# Patient Record
Sex: Male | Born: 1955
Health system: Southern US, Community
[De-identification: ages and names within clinical notes are randomized; demographics above are authoritative.]

## PROBLEM LIST (undated history)

## (undated) DIAGNOSIS — E119 Type 2 diabetes mellitus without complications: Secondary | ICD-10-CM

## (undated) DIAGNOSIS — R12 Heartburn: Secondary | ICD-10-CM

## (undated) DIAGNOSIS — C801 Malignant (primary) neoplasm, unspecified: Secondary | ICD-10-CM

## (undated) DIAGNOSIS — I10 Essential (primary) hypertension: Secondary | ICD-10-CM

## (undated) DIAGNOSIS — T7840XA Allergy, unspecified, initial encounter: Secondary | ICD-10-CM

## (undated) DIAGNOSIS — E785 Hyperlipidemia, unspecified: Secondary | ICD-10-CM

## (undated) HISTORY — PX: POLYPECTOMY: SHX149

## (undated) HISTORY — DX: Type 2 diabetes mellitus without complications: E11.9

## (undated) HISTORY — DX: Malignant (primary) neoplasm, unspecified: C80.1

## (undated) HISTORY — PX: COLONOSCOPY: SHX174

## (undated) HISTORY — DX: Hyperlipidemia, unspecified: E78.5

## (undated) HISTORY — DX: Heartburn: R12

## (undated) HISTORY — DX: Allergy, unspecified, initial encounter: T78.40XA

## (undated) HISTORY — PX: UPPER GASTROINTESTINAL ENDOSCOPY: SHX188

---

## 2002-05-18 ENCOUNTER — Ambulatory Visit (HOSPITAL_COMMUNITY): Admission: RE | Admit: 2002-05-18 | Discharge: 2002-05-18 | Payer: Self-pay | Admitting: General Surgery

## 2004-01-13 ENCOUNTER — Ambulatory Visit (HOSPITAL_COMMUNITY): Admission: RE | Admit: 2004-01-13 | Discharge: 2004-01-13 | Payer: Self-pay | Admitting: Family Medicine

## 2006-10-22 HISTORY — PX: CARPAL TUNNEL RELEASE: SHX101

## 2007-10-18 ENCOUNTER — Emergency Department (HOSPITAL_COMMUNITY): Admission: EM | Admit: 2007-10-18 | Discharge: 2007-10-18 | Payer: Self-pay | Admitting: Emergency Medicine

## 2007-10-21 ENCOUNTER — Ambulatory Visit (HOSPITAL_COMMUNITY): Admission: RE | Admit: 2007-10-21 | Discharge: 2007-10-21 | Payer: Self-pay | Admitting: Family Medicine

## 2007-11-18 ENCOUNTER — Ambulatory Visit (HOSPITAL_COMMUNITY): Admission: RE | Admit: 2007-11-18 | Discharge: 2007-11-18 | Payer: Self-pay | Admitting: General Surgery

## 2009-11-26 DIAGNOSIS — E785 Hyperlipidemia, unspecified: Secondary | ICD-10-CM

## 2009-11-26 HISTORY — DX: Hyperlipidemia, unspecified: E78.5

## 2010-07-02 DIAGNOSIS — C801 Malignant (primary) neoplasm, unspecified: Secondary | ICD-10-CM

## 2010-07-02 HISTORY — DX: Malignant (primary) neoplasm, unspecified: C80.1

## 2010-08-22 ENCOUNTER — Ambulatory Visit
Admission: RE | Admit: 2010-08-22 | Discharge: 2010-10-18 | Payer: Self-pay | Source: Home / Self Care | Attending: Radiation Oncology | Admitting: Radiation Oncology

## 2010-08-31 ENCOUNTER — Ambulatory Visit (HOSPITAL_COMMUNITY): Admission: RE | Admit: 2010-08-31 | Discharge: 2010-08-31 | Payer: Self-pay | Admitting: Radiation Oncology

## 2010-09-05 ENCOUNTER — Ambulatory Visit (HOSPITAL_COMMUNITY): Admission: RE | Admit: 2010-09-05 | Discharge: 2010-09-05 | Payer: Self-pay | Admitting: Internal Medicine

## 2010-09-21 ENCOUNTER — Ambulatory Visit
Admission: RE | Admit: 2010-09-21 | Discharge: 2010-09-21 | Payer: Self-pay | Source: Home / Self Care | Admitting: Urology

## 2010-09-21 HISTORY — PX: PROSTATE SURGERY: SHX751

## 2010-11-06 ENCOUNTER — Ambulatory Visit
Admission: RE | Admit: 2010-11-06 | Discharge: 2010-11-10 | Payer: Self-pay | Source: Home / Self Care | Attending: Radiation Oncology | Admitting: Radiation Oncology

## 2010-11-22 ENCOUNTER — Ambulatory Visit: Admission: RE | Admit: 2010-11-22 | Payer: Self-pay | Source: Ambulatory Visit | Admitting: Radiation Oncology

## 2010-11-26 DIAGNOSIS — I10 Essential (primary) hypertension: Secondary | ICD-10-CM | POA: Insufficient documentation

## 2010-11-26 HISTORY — DX: Essential (primary) hypertension: I10

## 2011-01-02 LAB — COMPREHENSIVE METABOLIC PANEL
ALT: 20 U/L (ref 0–53)
AST: 24 U/L (ref 0–37)
Albumin: 4.2 g/dL (ref 3.5–5.2)
Alkaline Phosphatase: 57 U/L (ref 39–117)
BUN: 9 mg/dL (ref 6–23)
CO2: 28 mEq/L (ref 19–32)
Calcium: 9.8 mg/dL (ref 8.4–10.5)
Chloride: 104 mEq/L (ref 96–112)
Creatinine, Ser: 1.24 mg/dL (ref 0.4–1.5)
GFR calc Af Amer: >60 mL/min (ref >60)
GFR calc non Af Amer: >60 mL/min (ref >60)
Glucose, Bld: 111 mg/dL — ABNORMAL HIGH (ref 70–99)
Potassium: 4.3 mEq/L (ref 3.5–5.1)
Sodium: 142 mEq/L (ref 135–145)
Total Bilirubin: 0.9 mg/dL (ref 0.3–1.2)
Total Protein: 7.1 g/dL (ref 6.0–8.3)

## 2011-01-02 LAB — CBC
HCT: 46.8 % (ref 39.0–52.0)
Hemoglobin: 15.9 g/dL (ref 13.0–17.0)
MCH: 28.6 pg (ref 26.0–34.0)
MCHC: 33.9 g/dL (ref 30.0–36.0)
MCV: 84.4 fL (ref 78.0–100.0)
Platelets: 261 10*3/uL (ref 150–400)
RBC: 5.55 MIL/uL (ref 4.22–5.81)
RDW: 13.6 % (ref 11.5–15.5)
WBC: 5.6 10*3/uL (ref 4.0–10.5)

## 2011-01-02 LAB — APTT: aPTT: 34 seconds (ref 24–37)

## 2011-01-02 LAB — PROTIME-INR
INR: 0.94 (ref 0.00–1.49)
Prothrombin Time: 12.8 seconds (ref 11.6–15.2)

## 2011-03-06 NOTE — H&P (Signed)
NAME:  Nathaniel Smith, Nathaniel Smith              ACCOUNT NO.:  [id number]   MEDICAL RECORD NO.:  [id number]          PATIENT TYPE:  AMB   LOCATION:  DAY                           FACILITY:  [facility]   PHYSICIAN:  Dalia Heading, M.D.  DATE OF BIRTH:  [birth year]   DATE OF ADMISSION:  DATE OF DISCHARGE:  LH                              HISTORY & PHYSICAL   CHIEF COMPLAINT:  Family history of colon carcinoma.   HISTORY OF PRESENT ILLNESS:  Patient is a 55 year old black male who is  referred for endoscopic evaluation.  He needs a colonoscopy due to a  family history of colon carcinoma.  No abdominal pain, weight loss,  nausea, vomiting, diarrhea, constipation, melena, or hematochezia have  been noted.  He last had a colonoscopy in 2003 which was negative.   PAST MEDICAL HISTORY:  Unremarkable.   PAST SURGICAL HISTORY:  Unremarkable.   CURRENT MEDICATIONS:  None.   ALLERGIES:  No known drug allergies.   REVIEW OF SYSTEMS:  Noncontributory.   PHYSICAL EXAMINATION:  Patient is a well-developed and well-nourished  black male in no acute distress.  LUNGS:  Clear to auscultation with equal breath sounds bilaterally.  HEART:  Regular rate and rhythm without S3, S4, or murmurs.  ABDOMEN:  Soft, nontender, nondistended.  No hepatosplenomegaly or  masses are noted.  RECTAL:  Deferred to the procedure.   IMPRESSION:  Family history of colon carcinoma.   PLAN:  Patient is scheduled for a colonoscopy on November 18, 2007.  The  risks and benefits of the procedure, including bleeding and perforation,  were fully explained to the patient, who gave informed consent.      Dalia Heading, M.D.  Electronically Signed     MAJ/MEDQ  D:  11/04/2007  T:  11/04/2007  Job:  [id number]   cc:   [facility]  Fax: [phone]   Kirk Ruths, M.D.  Fax: [phone]

## 2011-11-21 ENCOUNTER — Encounter: Payer: Self-pay | Admitting: *Deleted

## 2011-11-21 NOTE — Progress Notes (Signed)
I-PPS 10/13/10=[id number] score=13

## 2013-11-09 ENCOUNTER — Emergency Department (HOSPITAL_COMMUNITY)
Admission: EM | Admit: 2013-11-09 | Discharge: 2013-11-09 | Disposition: A | Discharge status: Home / Self Care | Payer: No Typology Code available for payment source | Source: Home / Self Care | Attending: Family Medicine | Admitting: Family Medicine

## 2013-11-09 ENCOUNTER — Encounter (HOSPITAL_COMMUNITY): Payer: Self-pay | Admitting: Emergency Medicine

## 2013-11-09 DIAGNOSIS — J069 Acute upper respiratory infection, unspecified: Secondary | ICD-10-CM

## 2013-11-09 HISTORY — DX: Essential (primary) hypertension: I10

## 2013-11-09 NOTE — Discharge Instructions (Signed)
Continue using the allergy medicine is helping to control your symptoms.  You might also try using saline nasal spray several times a day to help relieve the congestion and cough.    Upper Respiratory Infection, Adult An upper respiratory infection (URI) is also sometimes known as the common cold. The upper respiratory tract includes the nose, sinuses, throat, trachea, and bronchi. Bronchi are the airways leading to the lungs. Most people improve within 1 week, but symptoms can last up to 2 weeks. A residual cough may last even longer.  CAUSES Many different viruses can infect the tissues lining the upper respiratory tract. The tissues become irritated and inflamed and often become very moist. Mucus production is also common. A cold is contagious. You can easily spread the virus to others by oral contact. This includes kissing, sharing a glass, coughing, or sneezing. Touching your mouth or nose and then touching a surface, which is then touched by another person, can also spread the virus. SYMPTOMS  Symptoms typically develop 1 to 3 days after you come in contact with a cold virus. Symptoms vary from person to person. They may include:  Runny nose.  Sneezing.  Nasal congestion.  Sinus irritation.  Sore throat.  Loss of voice (laryngitis).  Cough.  Fatigue.  Muscle aches.  Loss of appetite.  Headache.  Low-grade fever. DIAGNOSIS  You might diagnose your own cold based on familiar symptoms, since most people get a cold 2 to 3 times a year. Your caregiver can confirm this based on your exam. Most importantly, your caregiver can check that your symptoms are not due to another disease such as strep throat, sinusitis, pneumonia, asthma, or epiglottitis. Blood tests, throat tests, and X-rays are not necessary to diagnose a common cold, but they may sometimes be helpful in excluding other more serious diseases. Your caregiver will decide if any further tests are required. RISKS AND  COMPLICATIONS  You may be at risk for a more severe case of the common cold if you smoke cigarettes, have chronic heart disease (such as heart failure) or lung disease (such as asthma), or if you have a weakened immune system. The very young and very old are also at risk for more serious infections. Bacterial sinusitis, middle ear infections, and bacterial pneumonia can complicate the common cold. The common cold can worsen asthma and chronic obstructive pulmonary disease (COPD). Sometimes, these complications can require emergency medical care and may be life-threatening. PREVENTION  The best way to protect against getting a cold is to practice good hygiene. Avoid oral or hand contact with people with cold symptoms. Wash your hands often if contact occurs. There is no clear evidence that vitamin C, vitamin E, echinacea, or exercise reduces the chance of developing a cold. However, it is always recommended to get plenty of rest and practice good nutrition. TREATMENT  Treatment is directed at relieving symptoms. There is no cure. Antibiotics are not effective, because the infection is caused by a virus, not by bacteria. Treatment may include:  Increased fluid intake. Sports drinks offer valuable electrolytes, sugars, and fluids.  Breathing heated mist or steam (vaporizer or shower).  Eating chicken soup or other clear broths, and maintaining good nutrition.  Getting plenty of rest.  Using gargles or lozenges for comfort.  Controlling fevers with ibuprofen or acetaminophen as directed by your caregiver.  Increasing usage of your inhaler if you have asthma. Zinc gel and zinc lozenges, taken in the first 24 hours of the common cold, can shorten  the duration and lessen the severity of symptoms. Pain medicines may help with fever, muscle aches, and throat pain. A variety of non-prescription medicines are available to treat congestion and runny nose. Your caregiver can make recommendations and may  suggest nasal or lung inhalers for other symptoms.  HOME CARE INSTRUCTIONS   Only take over-the-counter or prescription medicines for pain, discomfort, or fever as directed by your caregiver.  Use a warm mist humidifier or inhale steam from a shower to increase air moisture. This may keep secretions moist and make it easier to breathe.  Drink enough water and fluids to keep your urine clear or pale yellow.  Rest as needed.  Return to work when your temperature has returned to normal or as your caregiver advises. You may need to stay home longer to avoid infecting others. You can also use a face mask and careful hand washing to prevent spread of the virus. SEEK MEDICAL CARE IF:   After the first few days, you feel you are getting worse rather than better.  You need your caregiver's advice about medicines to control symptoms.  You develop chills, worsening shortness of breath, or brown or red sputum. These may be signs of pneumonia.  You develop yellow or brown nasal discharge or pain in the face, especially when you bend forward. These may be signs of sinusitis.  You develop a fever, swollen neck glands, pain with swallowing, or white areas in the back of your throat. These may be signs of strep throat. SEEK IMMEDIATE MEDICAL CARE IF:   You have a fever.  You develop severe or persistent headache, ear pain, sinus pain, or chest pain.  You develop wheezing, a prolonged cough, cough up blood, or have a change in your usual mucus (if you have chronic lung disease).  You develop sore muscles or a stiff neck. Document Released: 04/03/2001 Document Revised: 12/31/2011 Document Reviewed: 02/09/2011 [facility]: [facility] W Patient Information 2014 [place], [place].

## 2013-11-09 NOTE — ED Notes (Signed)
C/o nonproductive cough that started yesterday and gradually getting worse.  Scratchy/sore throat.  Denies fever and any other symptoms.  No otc meds tried.

## 2013-11-09 NOTE — ED Provider Notes (Signed)
CSN: [id number]     Arrival date & time 11/09/13  1436 History   First MD Initiated Contact with Patient 11/09/13 1644     Chief Complaint  Patient presents with  . Cough   (Consider location/radiation/quality/duration/timing/severity/associated sxs/prior Treatment) Patient is a 58 y.o. male presenting with URI. The history is provided by the patient.  URI Presenting symptoms: congestion, cough, ear pain, rhinorrhea and sore throat   Presenting symptoms: no fever   Severity:  Mild Onset quality:  Gradual Duration:  2 days Timing:  Constant Progression:  Unchanged Chronicity:  New Relieved by: otc "allergy" medicine. Ineffective treatments:  OTC medications Associated symptoms: no wheezing     Past Medical History  Diagnosis Date  . Hypertension    History reviewed. No pertinent past surgical history. History reviewed. No pertinent family history. History  Substance Use Topics  . Smoking status: Never Smoker   . Smokeless tobacco: Not on file  . Alcohol Use: No    Review of Systems  Constitutional: Negative for fever and chills.  HENT: Positive for congestion, ear pain, postnasal drip, rhinorrhea and sore throat.   Respiratory: Positive for cough. Negative for shortness of breath and wheezing.     Allergies  Review of patient's allergies indicates no known allergies.  Home Medications   Current Outpatient Rx  Name  Route  Sig  Dispense  Refill  . amLODipine (NORVASC) 5 MG tablet   Oral   Take 5 mg by mouth daily.         [name] atorvastatin (LIPITOR) 20 MG tablet   Oral   Take 20 mg by mouth daily.          BP 149/98  Pulse 92  Temp(Src) 98.6 F (37 C) (Oral)  Resp 16  SpO2 99% Physical Exam  Constitutional: He appears well-developed and well-nourished. No distress.  Well appearing  HENT:  Right Ear: External ear and ear canal normal. A middle ear effusion is present.  Left Ear: External ear and ear canal normal. A middle ear effusion is present.   Nose: Mucosal edema and rhinorrhea present.  Mouth/Throat: Oropharynx is clear and moist and mucous membranes are normal.  Cardiovascular: Normal rate and regular rhythm.   Pulmonary/Chest: Effort normal and breath sounds normal.  Occasional dry cough  Lymphadenopathy:       Head (right side): No submental, no submandibular, no tonsillar, no preauricular and no posterior auricular adenopathy present.       Head (left side): No submental, no submandibular, no tonsillar, no preauricular and no posterior auricular adenopathy present.    He has no cervical adenopathy.    ED Course  Procedures (including critical care time) Labs Review Labs Reviewed - No data to display Imaging Review No results found.  EKG Interpretation    Date/Time:    Ventricular Rate:    PR Interval:    QRS Duration:   QT Interval:    QTC Calculation:   R Axis:     Text Interpretation:              MDM   1. URI (upper respiratory infection)   Recommended pt continue using "allergy" medicine since it has been helping control symptoms and recommended pt try saline nasal spray.       Carvel Getting, NP 11/09/13 [phone]

## 2013-11-10 NOTE — ED Provider Notes (Signed)
Medical screening examination/treatment/procedure(s) were performed by a resident physician or non-physician practitioner and as the supervising physician I was immediately available for consultation/collaboration.  Lynne Leader, MD    Gregor Hams, MD 11/10/13 [phone]

## 2013-12-01 ENCOUNTER — Encounter: Payer: Self-pay | Admitting: Family Medicine

## 2013-12-15 ENCOUNTER — Other Ambulatory Visit: Payer: No Typology Code available for payment source

## 2013-12-15 LAB — PSA: PSA: 1.2 ng/mL (ref <=4.00)

## 2013-12-15 LAB — LIPID PANEL
CHOLESTEROL: 130 mg/dL (ref 0–200)
HDL: 32 mg/dL — ABNORMAL LOW (ref >39)
LDL Cholesterol: 71 mg/dL (ref 0–99)
Total CHOL/HDL Ratio: 4.1 Ratio
Triglycerides: 135 mg/dL (ref <150)
VLDL: 27 mg/dL (ref 0–40)

## 2013-12-15 LAB — CBC WITH DIFFERENTIAL/PLATELET
Basophils Absolute: 0.1 10*3/uL (ref 0.0–0.1)
Basophils Relative: 1 % (ref 0–1)
EOS PCT: 2 % (ref 0–5)
Eosinophils Absolute: 0.1 10*3/uL (ref 0.0–0.7)
HCT: 44.6 % (ref 39.0–52.0)
Hemoglobin: 15.5 g/dL (ref 13.0–17.0)
LYMPHS ABS: 2.3 10*3/uL (ref 0.7–4.0)
LYMPHS PCT: 43 % (ref 12–46)
MCH: 27.7 pg (ref 26.0–34.0)
MCHC: 34.8 g/dL (ref 30.0–36.0)
MCV: 79.8 fL (ref 78.0–100.0)
Monocytes Absolute: 0.4 10*3/uL (ref 0.1–1.0)
Monocytes Relative: 7 % (ref 3–12)
NEUTROS ABS: 2.5 10*3/uL (ref 1.7–7.7)
NEUTROS PCT: 47 % (ref 43–77)
PLATELETS: 238 10*3/uL (ref 150–400)
RBC: 5.59 MIL/uL (ref 4.22–5.81)
RDW: 14.5 % (ref 11.5–15.5)
WBC: 5.4 10*3/uL (ref 4.0–10.5)

## 2013-12-15 LAB — COMPREHENSIVE METABOLIC PANEL
ALBUMIN: 4.1 g/dL (ref 3.5–5.2)
ALT: 22 U/L (ref 0–53)
AST: 19 U/L (ref 0–37)
Alkaline Phosphatase: 57 U/L (ref 39–117)
BUN: 7 mg/dL (ref 6–23)
CALCIUM: 9.3 mg/dL (ref 8.4–10.5)
CHLORIDE: 103 meq/L (ref 96–112)
CO2: 27 meq/L (ref 19–32)
Creat: 1.15 mg/dL (ref 0.50–1.35)
Glucose, Bld: 106 mg/dL — ABNORMAL HIGH (ref 70–99)
POTASSIUM: 4.6 meq/L (ref 3.5–5.3)
SODIUM: 140 meq/L (ref 135–145)
TOTAL PROTEIN: 6.6 g/dL (ref 6.0–8.3)
Total Bilirubin: 0.3 mg/dL (ref 0.2–1.2)

## 2013-12-16 ENCOUNTER — Encounter: Payer: Self-pay | Admitting: Physician Assistant

## 2013-12-16 ENCOUNTER — Ambulatory Visit (INDEPENDENT_AMBULATORY_CARE_PROVIDER_SITE_OTHER): Payer: No Typology Code available for payment source | Admitting: Physician Assistant

## 2013-12-16 VITALS — BP 162/94 | HR 72 | Temp 98.2°F | Resp 18 | Ht 70.0 in | Wt 202.0 lb

## 2013-12-16 DIAGNOSIS — I10 Essential (primary) hypertension: Secondary | ICD-10-CM

## 2013-12-16 DIAGNOSIS — Z8249 Family history of ischemic heart disease and other diseases of the circulatory system: Secondary | ICD-10-CM

## 2013-12-16 DIAGNOSIS — C801 Malignant (primary) neoplasm, unspecified: Secondary | ICD-10-CM

## 2013-12-16 DIAGNOSIS — Z0000 Encounter for general adult medical examination without abnormal findings: Secondary | ICD-10-CM

## 2013-12-16 DIAGNOSIS — E785 Hyperlipidemia, unspecified: Secondary | ICD-10-CM

## 2013-12-16 MED ORDER — ATORVASTATIN CALCIUM 10 MG PO TABS: 10.0000 mg | ORAL_TABLET | Freq: Every day | ORAL | Status: DC

## 2013-12-17 ENCOUNTER — Encounter: Payer: Self-pay | Admitting: Physician Assistant

## 2013-12-17 NOTE — Progress Notes (Signed)
Patient ID: Nathaniel Smith MRN: [id number], DOB: [birth year] 58 y.o. Date of Encounter: 12/17/2013, 7:29 AM    Chief Complaint: Physical (CPE)  HPI: 58 y.o. y/o male here for CPE. He is being seen as a NEW patient to our office today. He states that multiple of his family members see Dr. Buelah Manis and have recommended that he come to our office. Says he has been going to [facility] in [place] but the office has gotten very large and very busy.  He says he goes to [facility] every 6 months. History of prostate cancer and had seed implants.  Only other medical history is hyperlipidemia and hypertension. Takes Norvasc. He currently still takes Lipitor. Says he was on over-the-counter niacin but stopped it secondary to cost. Says he never was on prescription Niaspan. Says he was on Fish Oil prior to switching to the niacin.   Review of Systems: Consitutional: No fever, chills, fatigue, night sweats, lymphadenopathy, or weight changes. Eyes: No visual changes, eye redness, or discharge. ENT/Mouth: Ears: No otalgia, tinnitus, hearing loss, discharge. Nose: No congestion, rhinorrhea, sinus pain, or epistaxis. Throat: No sore throat, post nasal drip, or teeth pain. Cardiovascular: No CP, palpitations, diaphoresis, DOE, edema, orthopnea, PND. Respiratory: No cough, hemoptysis, SOB, or wheezing. Gastrointestinal: No anorexia, dysphagia, reflux, pain, nausea, vomiting, hematemesis, diarrhea, constipation, BRBPR, or melena. Genitourinary: No dysuria, frequency, urgency, hematuria, incontinence, nocturia, decreased urinary stream, discharge, impotence, or testicular pain/masses. Musculoskeletal: No decreased ROM, myalgias, stiffness, joint swelling, or weakness. Skin: No rash, erythema, lesion changes, pain, warmth, jaundice, or pruritis. Neurological: No headache, dizziness, syncope, seizures, tremors, memory loss, coordination problems, or paresthesias. Psychological: No anxiety,  depression, hallucinations, SI/HI. Endocrine: No fatigue, polydipsia, polyphagia, polyuria, or known diabetes. All other systems were reviewed and are otherwise negative.  Past Medical History  Diagnosis Date  . Hypertension 11/26/2010  . Hyperlipidemia 11/26/2009  . Cancer 07/02/10    prostate     Past Surgical History  Procedure Laterality Date  . Prostate surgery  09/2010    seeds implant  . Carpal tunnel release Right 10/22/2006    [facility]    Home Meds:  Current Outpatient Prescriptions on File Prior to Visit  Medication Sig Dispense Refill  . amLODipine (NORVASC) 5 MG tablet Take 5 mg by mouth at bedtime.       [name] aspirin 81 MG tablet Take 81 mg by mouth at bedtime.          lipitor--see attached section for dose-- No current facility-administered medications on file prior to visit.    Allergies: No Known Allergies  History   Social History  . Marital Status: Divorced    Spouse Name: N/A    Number of Children: N/A  . Years of Education: N/A   Occupational History  . Not on file.   Social History Main Topics  . Smoking status: Former Research scientist (life sciences)  . Smokeless tobacco: Never Used  . Alcohol Use: No  . Drug Use: No  . Sexual Activity: Yes    Birth Control/ Protection: Condom   Other Topics Concern  . Not on file   Social History Narrative  . No narrative on file    He works scrubbing dump trucks. Hauls dirt, rock, etc. Divorced. Has one son. One stepdaughter. [place]  Family History  Problem Relation Age of Onset  . Heart disease Brother 26    Stent at 54--CAD    Physical Exam: Blood  pressure 162/94, pulse 72, temperature 98.2 F (36.8 C), temperature source Oral, resp. rate 18, height 5\' 10"  (1.778 m), weight 202 lb (91.627 kg).  General: Well developed, well nourished, AAM. Appears in no acute distress. HEENT: Normocephalic, atraumatic. Conjunctiva pink, sclera non-icteric. Pupils 2 mm  constricting to 1 mm, round, regular, and equally reactive to light and accomodation. EOMI. Internal auditory canal clear. TMs with good cone of light and without pathology. Nasal mucosa pink. Nares are without discharge. No sinus tenderness. Oral mucosa pink. Dentition nml.. Pharynx without exudate.   Neck: Supple. Trachea midline. No thyromegaly. Full ROM. No lymphadenopathy. Lungs: Clear to auscultation bilaterally without wheezes, rales, or rhonchi. Breathing is of normal effort and unlabored. Cardiovascular: RRR with S1 S2. No murmurs, rubs, or gallops. Distal pulses 2+ symmetrically. No carotid or abdominal bruits. Abdomen: Soft, non-tender, non-distended with normoactive bowel sounds. No hepatosplenomegaly or masses. No rebound/guarding. No CVA tenderness. No hernias. Rectal: Deferred today. He sees urology and has this with them every 6 months. Musculoskeletal: Full range of motion and 5/5 strength throughout. Without swelling, atrophy, tenderness, crepitus, or warmth. Extremities without clubbing, cyanosis, or edema. Calves supple. Skin: Warm and moist without erythema, ecchymosis, wounds, or rash. Neuro: A+Ox3. CN II-XII grossly intact. Moves all extremities spontaneously. Full sensation throughout. Normal gait. DTR 2+ throughout upper and lower extremities. Finger to nose intact. Psych:  Responds to questions appropriately with a normal affect.   Assessment/Plan:  58 y.o. y/o  male here for CPE -1. Visit for preventive health examination  A. Screening Labs: He came and had fasting labs done 12/15/13. Had CBC, CMET, PSA, FLP. All labs were normal.  B. Screening For Prostate Cancer: See history of present illness. He has a history of prostate cancer and sees [facility] every 6 months.  C. Screening For Colorectal Cancer:  He reports that he has had 2 colonoscopies. They were performed in [place] but does not remember that doctor's name. Says he thinks that he was told that the  first one showed polyps. Does not recall whether the second one showed any polyps or if it was normal. He is uncertain the date that he is due for followup.  D. Immunizations: Flu: He reports he has received a flu vaccine this year. Tetanus: HE IS NOT CERTAIN ABOUT THIS--WILL CHECK RECORDS AT [facility]. IF NONE IN PAST 10 YEARS, WILL UPDATE Pneumonia Vaccines: Will start at age 63. No indication to receive one sooner. Zostavax: Will Discuss at age 24.    2. Family history of premature coronary artery disease Brother had a coronary stent at age 77. However neither of his parents have had CAD. He says his father has actually had some symptoms of CHF. Says that he has had a cardiac cath that showed no significant CAD. Says that he was a long-time heavy smoker.  3. Hyperlipidemia Lipid panel performed yesterday shows LDL down to 71. Discussed patient's risk factor profile with him. Only positive cardiac risk factors are hypertension and his brother's history of stent. However there is no other family history of CAD. Discussed whether to keep him on Lipitor 20 versus decreasing the dose. We will go ahead and decrease the dose down to 10 mg and monitor. - atorvastatin (LIPITOR) 10 MG tablet; Take 1 tablet (10 mg total) by mouth daily.  Dispense: 90 tablet; Refill: 3  4. Hypertension Initial blood pressure reading today was elevated. However I repeated and got a low reading. he states his blood pressure has been good.  Continue current medication now and monitor prior to adjusting medicine.  5.H/O Prostate Cancer--managed at [facility]  Followup regular office visit 6 months or sooner if needed.  Signed:   [address] [place], [place]  12/17/2013 7:29 AM

## 2014-06-17 ENCOUNTER — Ambulatory Visit: Payer: No Typology Code available for payment source | Admitting: Physician Assistant

## 2014-11-25 ENCOUNTER — Other Ambulatory Visit: Payer: No Typology Code available for payment source

## 2014-11-25 ENCOUNTER — Other Ambulatory Visit: Payer: Self-pay | Admitting: Family Medicine

## 2014-11-25 ENCOUNTER — Telehealth: Payer: Self-pay | Admitting: *Deleted

## 2014-11-25 DIAGNOSIS — Z0000 Encounter for general adult medical examination without abnormal findings: Secondary | ICD-10-CM

## 2014-11-25 DIAGNOSIS — Z79899 Other long term (current) drug therapy: Secondary | ICD-10-CM

## 2014-11-25 DIAGNOSIS — I10 Essential (primary) hypertension: Secondary | ICD-10-CM

## 2014-11-25 DIAGNOSIS — E785 Hyperlipidemia, unspecified: Secondary | ICD-10-CM

## 2014-11-25 LAB — COMPLETE METABOLIC PANEL WITH GFR
ALK PHOS: 46 U/L (ref 39–117)
ALT: 22 U/L (ref 0–53)
AST: 23 U/L (ref 0–37)
Albumin: 4.2 g/dL (ref 3.5–5.2)
BUN: 9 mg/dL (ref 6–23)
CHLORIDE: 104 meq/L (ref 96–112)
CO2: 25 mEq/L (ref 19–32)
CREATININE: 1.22 mg/dL (ref 0.50–1.35)
Calcium: 9.6 mg/dL (ref 8.4–10.5)
GFR, EST AFRICAN AMERICAN: 75 mL/min
GFR, Est Non African American: 65 mL/min
GLUCOSE: 87 mg/dL (ref 70–99)
POTASSIUM: 4.4 meq/L (ref 3.5–5.3)
SODIUM: 139 meq/L (ref 135–145)
Total Bilirubin: 0.6 mg/dL (ref 0.2–1.2)
Total Protein: 6.8 g/dL (ref 6.0–8.3)

## 2014-11-25 LAB — CBC WITH DIFFERENTIAL/PLATELET
BASOS ABS: 0.1 10*3/uL (ref 0.0–0.1)
Basophils Relative: 1 % (ref 0–1)
EOS ABS: 0.1 10*3/uL (ref 0.0–0.7)
EOS PCT: 2 % (ref 0–5)
HEMATOCRIT: 45.3 % (ref 39.0–52.0)
HEMOGLOBIN: 15.4 g/dL (ref 13.0–17.0)
LYMPHS ABS: 2.2 10*3/uL (ref 0.7–4.0)
LYMPHS PCT: 41 % (ref 12–46)
MCH: 27.5 pg (ref 26.0–34.0)
MCHC: 34 g/dL (ref 30.0–36.0)
MCV: 80.9 fL (ref 78.0–100.0)
MPV: 9.5 fL (ref 8.6–12.4)
Monocytes Absolute: 0.4 10*3/uL (ref 0.1–1.0)
Monocytes Relative: 7 % (ref 3–12)
NEUTROS ABS: 2.6 10*3/uL (ref 1.7–7.7)
Neutrophils Relative %: 49 % (ref 43–77)
PLATELETS: 262 10*3/uL (ref 150–400)
RBC: 5.6 MIL/uL (ref 4.22–5.81)
RDW: 13.7 % (ref 11.5–15.5)
WBC: 5.4 10*3/uL (ref 4.0–10.5)

## 2014-11-25 LAB — LIPID PANEL
CHOL/HDL RATIO: 4 ratio
CHOLESTEROL: 159 mg/dL (ref 0–200)
HDL: 40 mg/dL (ref >39)
LDL CALC: 101 mg/dL — AB (ref 0–99)
Triglycerides: 92 mg/dL (ref <150)
VLDL: 18 mg/dL (ref 0–40)

## 2014-11-25 MED ORDER — AMLODIPINE BESYLATE 5 MG PO TABS: 5.0000 mg | ORAL_TABLET | Freq: Every day | ORAL | Status: DC

## 2014-11-25 NOTE — Telephone Encounter (Signed)
Received call from patient.  Requested refill on HTN medication for 30 days since he has CPE scheduled with MD in 2 weeks.   Prescription sent to pharmacy.

## 2014-11-30 ENCOUNTER — Encounter: Payer: Self-pay | Admitting: Family Medicine

## 2014-11-30 ENCOUNTER — Ambulatory Visit (INDEPENDENT_AMBULATORY_CARE_PROVIDER_SITE_OTHER): Payer: No Typology Code available for payment source | Admitting: Family Medicine

## 2014-11-30 ENCOUNTER — Other Ambulatory Visit: Payer: Self-pay | Admitting: Family Medicine

## 2014-11-30 VITALS — BP 132/98 | HR 86 | Temp 98.1°F | Resp 16 | Ht 70.0 in | Wt 201.0 lb

## 2014-11-30 DIAGNOSIS — I10 Essential (primary) hypertension: Secondary | ICD-10-CM

## 2014-11-30 MED ORDER — CLONIDINE HCL 0.1 MG PO TABS: 0.1000 mg | ORAL_TABLET | Freq: Two times a day (BID) | ORAL | Status: DC

## 2014-11-30 NOTE — Progress Notes (Signed)
Subjective:    Patient ID: Nathaniel Smith, male    DOB: 1956/02/27, 59 y.o.   MRN: [id number]  HPI Patient has been taking his amlodipine regularly for the last several weeks. His blood pressures typically 150/100 and home. Here today is 132/98. 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 He would like a medication that would work instantly to help lower his blood pressure to that he could take his DOT physical tomorrow. He denies any chest pain shortness of breath or dyspnea on exertion. Past Medical History  Diagnosis Date  . Hypertension 11/26/2010  . Hyperlipidemia 11/26/2009  . Cancer 07/02/10    prostate   Past Surgical History  Procedure Laterality Date  . Prostate surgery  09/2010    seeds implant  . Carpal tunnel release Right 10/22/2006    [facility]   Current Outpatient Prescriptions on File Prior to Visit  Medication Sig Dispense Refill  . amLODipine (NORVASC) 5 MG tablet TAKE 1 TABLET BY MOUTH EVERY NIGHT AT BEDTIME 90 tablet 1  . aspirin 81 MG tablet Take 81 mg by mouth at bedtime.    [name] atorvastatin (LIPITOR) 10 MG tablet Take 1 tablet (10 mg total) by mouth daily. 90 tablet 3  . Multiple Vitamin (MULTIVITAMIN) tablet Take 1 tablet by mouth daily.    . sildenafil (REVATIO) 20 MG tablet Take 20 mg by mouth as needed.     No current facility-administered medications on file prior to visit.   No Known Allergies History   Social History  . Marital Status: Divorced    Spouse Name: N/A    Number of Children: N/A  . Years of Education: N/A   Occupational History  . Not on file.   Social History Main Topics  . Smoking status: Former Research scientist (life sciences)  . Smokeless tobacco: Never Used  . Alcohol Use: No  . Drug Use: No  . Sexual Activity: Yes    Birth Control/ Protection: Condom   Other Topics Concern  . Not on file   Social History Narrative   He drives dump trucks. Hauls rock, dirt, etc.   Divorced. Has one son  and one stepdaughter.   :[place] every morning Monday through Friday.   He smoked 1/2 pack a day starting at age 61 and quit at age 26.--smoked for about 26 years.    Has Smoked none for 13 years.      Review of Systems  All other systems reviewed and are negative.      Objective:   Physical Exam  Cardiovascular: Normal rate, regular rhythm and normal heart sounds.   Pulmonary/Chest: Effort normal and breath sounds normal. No respiratory distress. He has no wheezes. He has no rales.  Abdominal: Soft. Bowel sounds are normal.  Musculoskeletal: He exhibits no edema.  Vitals reviewed.         Assessment & Plan:  Benign essential HTN - Plan: cloNIDine (CATAPRES) 0.1 MG tablet  Although not my first choice, I will start the patient on clonidine 0.1 mg by mouth twice a day to help lower his blood pressure aggressively so he can take his DOT physical as soon as possible. Once he passes his DOT physical, I would like to discontinue the clonidine and switch the patient to Cozaar 50 mg by mouth daily which I think is a better and safer long-term option. Patient is to take the clonidine tonight and  then first thing in the morning and come by here middle of the day. I would like to check his blood pressure here and write that down on a prescription pad so that he can take that with him to his DOT physical because I do believe there is an element of white coat syndrome artificially elevating his blood pressure when he has a DOT physical.

## 2014-12-01 ENCOUNTER — Ambulatory Visit: Payer: No Typology Code available for payment source | Admitting: *Deleted

## 2014-12-01 ENCOUNTER — Telehealth: Payer: Self-pay | Admitting: *Deleted

## 2014-12-01 VITALS — BP 130/86

## 2014-12-01 DIAGNOSIS — I10 Essential (primary) hypertension: Secondary | ICD-10-CM

## 2014-12-01 NOTE — Telephone Encounter (Signed)
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

## 2014-12-02 NOTE — Telephone Encounter (Signed)
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

## 2014-12-02 NOTE — Telephone Encounter (Signed)
lmtrc

## 2014-12-03 ENCOUNTER — Telehealth: Payer: Self-pay | Admitting: Physician Assistant

## 2014-12-03 ENCOUNTER — Other Ambulatory Visit: Payer: Self-pay | Admitting: Family Medicine

## 2014-12-03 MED ORDER — LOSARTAN POTASSIUM 100 MG PO TABS: 100.0000 mg | ORAL_TABLET | Freq: Every day | ORAL | Status: DC

## 2014-12-03 NOTE — Telephone Encounter (Signed)
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

## 2014-12-03 NOTE — Telephone Encounter (Signed)
Switch clonidine to losartan 100 mg poqday in addition to amlodipine.

## 2014-12-03 NOTE — Telephone Encounter (Signed)
Pt aware and med sent to pharm 

## 2014-12-03 NOTE — Telephone Encounter (Signed)
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

## 2014-12-07 ENCOUNTER — Encounter: Payer: Self-pay | Admitting: Family Medicine

## 2014-12-07 ENCOUNTER — Ambulatory Visit (INDEPENDENT_AMBULATORY_CARE_PROVIDER_SITE_OTHER): Payer: No Typology Code available for payment source | Admitting: Family Medicine

## 2014-12-07 VITALS — BP 118/90 | HR 74 | Temp 98.2°F | Resp 16 | Ht 70.0 in | Wt 203.0 lb

## 2014-12-07 DIAGNOSIS — Z0000 Encounter for general adult medical examination without abnormal findings: Secondary | ICD-10-CM

## 2014-12-07 DIAGNOSIS — I10 Essential (primary) hypertension: Secondary | ICD-10-CM

## 2014-12-07 DIAGNOSIS — E785 Hyperlipidemia, unspecified: Secondary | ICD-10-CM

## 2014-12-07 DIAGNOSIS — Z8249 Family history of ischemic heart disease and other diseases of the circulatory system: Secondary | ICD-10-CM

## 2014-12-07 NOTE — Progress Notes (Signed)
Subjective:    Patient ID: Nathaniel Smith, male    DOB: 02/15/1956, 59 y.o.   MRN: [id number]  HPI Patient is a very pleasant 59 year old African-American male who is here today for a complete physical exam. Medical history is significant for hypertension, hyperlipidemia, a family history of premature coronary artery disease,, and prostate cancer. His prostate is monitored by his urologist. His most recent PSA was 0.7. Patient's last colonoscopy was in 2009 and was normal. He is not due again until 2019. He is due today for a tetanus vaccine as well as a flu shot but he declines this. He is currently on losartan and amlodipine for hypertension. His blood pressure is much improved on these medications at 118/90. He denies any chest pain shortness of breath or dyspnea on exertion. His most recent lab work as listed below: Lab on 11/25/2014  Component Date Value Ref Range Status  . Sodium 11/25/2014 139  135 - 145 mEq/L Final  . Potassium 11/25/2014 4.4  3.5 - 5.3 mEq/L Final  . Chloride 11/25/2014 104  96 - 112 mEq/L Final  . CO2 11/25/2014 25  19 - 32 mEq/L Final  . Glucose, Bld 11/25/2014 87  70 - 99 mg/dL Final  . BUN 11/25/2014 9  6 - 23 mg/dL Final  . Creat 11/25/2014 1.22  0.50 - 1.35 mg/dL Final  . Total Bilirubin 11/25/2014 0.6  0.2 - 1.2 mg/dL Final  . Alkaline Phosphatase 11/25/2014 46  39 - 117 U/L Final  . AST 11/25/2014 23  0 - 37 U/L Final  . ALT 11/25/2014 22  0 - 53 U/L Final  . Total Protein 11/25/2014 6.8  6.0 - 8.3 g/dL Final  . Albumin 11/25/2014 4.2  3.5 - 5.2 g/dL Final  . Calcium 11/25/2014 9.6  8.4 - 10.5 mg/dL Final  . GFR, Est African American 11/25/2014 75   Final  . GFR, Est Non African American 11/25/2014 65   Final   Comment:   The estimated GFR is a calculation valid for adults (>=73 years old) that uses the CKD-EPI algorithm to adjust for age and sex. It is   not to be used for children, pregnant women, hospitalized patients,    patients on dialysis, or  with rapidly changing kidney function. According to the NKDEP, eGFR >89 is normal, 60-89 shows mild impairment, 30-59 shows moderate impairment, 15-29 shows severe impairment and <15 is ESRD.     [name] Cholesterol 11/25/2014 159  0 - 200 mg/dL Final   Comment: ATP III Classification:       < 200        mg/dL        Desirable      200 - 239     mg/dL        Borderline High      >= 240        mg/dL        High     . Triglycerides 11/25/2014 92  <150 mg/dL Final  . HDL 11/25/2014 40  >39 mg/dL Final  . Total CHOL/HDL Ratio 11/25/2014 4.0   Final  . VLDL 11/25/2014 18  0 - 40 mg/dL Final  . LDL Cholesterol 11/25/2014 101* 0 - 99 mg/dL Final   Comment:   Total Cholesterol/HDL Ratio:CHD Risk                        Coronary Heart Disease Risk Table  Men       Women          1/2 Average Risk              3.4        3.3              Average Risk              5.0        4.4           2X Average Risk              9.6        7.1           3X Average Risk             23.4       11.0 Use the calculated Patient Ratio above and the CHD Risk table  to determine the patient's CHD Risk. ATP III Classification (LDL):       < 100        mg/dL         Optimal      100 - 129     mg/dL         Near or Above Optimal      130 - 159     mg/dL         Borderline High      160 - 189     mg/dL         High       > 190        mg/dL         Very High     . WBC 11/25/2014 5.4  4.0 - 10.5 K/uL Final  . RBC 11/25/2014 5.60  4.22 - 5.81 MIL/uL Final  . Hemoglobin 11/25/2014 15.4  13.0 - 17.0 g/dL Final  . HCT 11/25/2014 45.3  39.0 - 52.0 % Final  . MCV 11/25/2014 80.9  78.0 - 100.0 fL Final  . MCH 11/25/2014 27.5  26.0 - 34.0 pg Final  . MCHC 11/25/2014 34.0  30.0 - 36.0 g/dL Final  . RDW 11/25/2014 13.7  11.5 - 15.5 % Final  . Platelets 11/25/2014 262  150 - 400 K/uL Final  . MPV 11/25/2014 9.5  8.6 - 12.4 fL Final  . Neutrophils Relative % 11/25/2014 49  43 - 77 % Final    . Neutro Abs 11/25/2014 2.6  1.7 - 7.7 K/uL Final  . Lymphocytes Relative 11/25/2014 41  12 - 46 % Final  . Lymphs Abs 11/25/2014 2.2  0.7 - 4.0 K/uL Final  . Monocytes Relative 11/25/2014 7  3 - 12 % Final  . Monocytes Absolute 11/25/2014 0.4  0.1 - 1.0 K/uL Final  . Eosinophils Relative 11/25/2014 2  0 - 5 % Final  . Eosinophils Absolute 11/25/2014 0.1  0.0 - 0.7 K/uL Final  . Basophils Relative 11/25/2014 1  0 - 1 % Final  . Basophils Absolute 11/25/2014 0.1  0.0 - 0.1 K/uL Final  . Smear Review 11/25/2014 Criteria for review not met   Final   Past Medical History  Diagnosis Date  . Hypertension 11/26/2010  . Hyperlipidemia 11/26/2009  . Cancer 07/02/10    prostate   Past Surgical History  Procedure Laterality Date  . Prostate surgery  09/2010    seeds implant  . Carpal tunnel release Right 10/22/2006    Antionette Char   Current Outpatient Prescriptions  on File Prior to Visit  Medication Sig Dispense Refill  . amLODipine (NORVASC) 5 MG tablet TAKE 1 TABLET BY MOUTH EVERY NIGHT AT BEDTIME 90 tablet 1  . aspirin 81 MG tablet Take 81 mg by mouth at bedtime.    [name] atorvastatin (LIPITOR) 10 MG tablet Take 1 tablet (10 mg total) by mouth daily. 90 tablet 3  . Multiple Vitamin (MULTIVITAMIN) tablet Take 1 tablet by mouth daily.    . sildenafil (REVATIO) 20 MG tablet Take 20 mg by mouth as needed.     No current facility-administered medications on file prior to visit.   No Known Allergies History   Social History  . Marital Status: Divorced    Spouse Name: N/A  . Number of Children: N/A  . Years of Education: N/A   Occupational History  . Not on file.   Social History Main Topics  . Smoking status: Former Research scientist (life sciences)  . Smokeless tobacco: Never Used  . Alcohol Use: No  . Drug Use: No  . Sexual Activity: Yes    Birth Control/ Protection: Condom   Other Topics Concern  . Not on file   Social History Narrative   He drives dump trucks. Hauls rock, dirt, etc.   Divorced.  Has one son and one stepdaughter.   :Walks on  [place] for 1 mile every morning Monday through Friday.   He smoked 1/2 pack a day starting at age 47 and quit at age 59.--smoked for about 26 years.    Has Smoked none for 13 years.   Family History  Problem Relation Age of Onset  . Heart disease Brother 51    Stent at 54--CAD      Review of Systems  All other systems reviewed and are negative.      Objective:   Physical Exam  Constitutional: He is oriented to person, place, and time. He appears well-developed and well-nourished. No distress.  HENT:  Head: Normocephalic and atraumatic.  Right Ear: External ear normal.  Left Ear: External ear normal.  Nose: Nose normal.  Mouth/Throat: Oropharynx is clear and moist. No oropharyngeal exudate.  Eyes: Conjunctivae and EOM are normal. Pupils are equal, round, and reactive to light. Right eye exhibits no discharge. Left eye exhibits no discharge. No scleral icterus.  Neck: Normal range of motion. Neck supple. No JVD present. No tracheal deviation present. No thyromegaly present.  Cardiovascular: Normal rate, regular rhythm, normal heart sounds and intact distal pulses.  Exam reveals no gallop and no friction rub.   No murmur heard. Pulmonary/Chest: Effort normal and breath sounds normal. No stridor. No respiratory distress. He has no wheezes. He has no rales. He exhibits no tenderness.  Abdominal: Soft. Bowel sounds are normal. He exhibits no distension and no mass. There is no tenderness. There is no rebound and no guarding.  Musculoskeletal: Normal range of motion. He exhibits no edema or tenderness.  Lymphadenopathy:    He has no cervical adenopathy.  Neurological: He is alert and oriented to person, place, and time. He has normal reflexes. He displays normal reflexes. No cranial nerve deficit. He exhibits normal muscle tone. Coordination normal.  Skin: Skin is warm. No rash noted. He is not diaphoretic. No erythema. No pallor.    Psychiatric: He has a normal mood and affect. His behavior is normal. Judgment and thought content normal.  Vitals reviewed.         Assessment & Plan:  Routine general medical examination at a health care facility  Benign essential HTN - Plan: EKG 12-Lead  HLD (hyperlipidemia) - Plan: EKG 12-Lead  Family history of early CAD - Plan: EKG 12-Lead  Physical exam today is completely normal. His blood pressure is much improved. His lab work is excellent. Colonoscopy and prostate exam are up-to-date. I recommended a flu shot and tetanus shot but the patient declined. I will obtain a screening EKG to have as a baseline for the patient's chart given his multiple risk factors and his family history of coronary artery disease.  EKG shows normal sinus rhythm with a right bundle branch block, possible left atrial enlargement, and nonspecific ST changes. Recheck in 6 months or as needed.

## 2014-12-16 ENCOUNTER — Other Ambulatory Visit: Payer: No Typology Code available for payment source

## 2014-12-20 ENCOUNTER — Encounter: Payer: No Typology Code available for payment source | Admitting: Family Medicine

## 2015-05-05 ENCOUNTER — Encounter (HOSPITAL_COMMUNITY): Payer: Self-pay

## 2015-05-05 ENCOUNTER — Emergency Department (HOSPITAL_COMMUNITY)
Admission: EM | Admit: 2015-05-05 | Discharge: 2015-05-05 | Disposition: A | Discharge status: Home / Self Care | Payer: Self-pay | Attending: Emergency Medicine | Admitting: Emergency Medicine

## 2015-05-05 ENCOUNTER — Emergency Department (HOSPITAL_COMMUNITY): Payer: Self-pay

## 2015-05-05 DIAGNOSIS — S8012XA Contusion of left lower leg, initial encounter: Secondary | ICD-10-CM | POA: Insufficient documentation

## 2015-05-05 DIAGNOSIS — I10 Essential (primary) hypertension: Secondary | ICD-10-CM | POA: Insufficient documentation

## 2015-05-05 DIAGNOSIS — Z7982 Long term (current) use of aspirin: Secondary | ICD-10-CM | POA: Insufficient documentation

## 2015-05-05 DIAGNOSIS — Y9389 Activity, other specified: Secondary | ICD-10-CM | POA: Insufficient documentation

## 2015-05-05 DIAGNOSIS — Y9289 Other specified places as the place of occurrence of the external cause: Secondary | ICD-10-CM | POA: Insufficient documentation

## 2015-05-05 DIAGNOSIS — Z87891 Personal history of nicotine dependence: Secondary | ICD-10-CM | POA: Insufficient documentation

## 2015-05-05 DIAGNOSIS — Y990 Civilian activity done for income or pay: Secondary | ICD-10-CM | POA: Insufficient documentation

## 2015-05-05 DIAGNOSIS — Z8546 Personal history of malignant neoplasm of prostate: Secondary | ICD-10-CM | POA: Insufficient documentation

## 2015-05-05 DIAGNOSIS — W228XXA Striking against or struck by other objects, initial encounter: Secondary | ICD-10-CM | POA: Insufficient documentation

## 2015-05-05 DIAGNOSIS — Z79899 Other long term (current) drug therapy: Secondary | ICD-10-CM | POA: Insufficient documentation

## 2015-05-05 DIAGNOSIS — E785 Hyperlipidemia, unspecified: Secondary | ICD-10-CM | POA: Insufficient documentation

## 2015-05-05 NOTE — ED Notes (Signed)
Pt states he accidentally hit his left leg with a board at work last Tuesday, states since then it has gotten more painful and more swollen.  Pt ambulatory with minimal diff.

## 2015-05-05 NOTE — ED Provider Notes (Signed)
TIME SEEN: 3:45 AM  CHIEF COMPLAINT: Left leg pain and swelling  HPI: Pt is a 59 y.o. with history of hypertension, hyperlipidemia, previous history of prostate cancer who presents to the emergency department with left leg pain and swelling. He reports that one week ago he was working at a job site and a large piece of plywood hit him in the left shin and then slid down his leg. He noted that he had a knot over the medial aspect of the anterior proximal lower extremity. Noticed some pain in this area but was able to ambulate and work for several days. States that over the weekend he went to visit friends in [place]. On the flight home he noticed that his leg was more swollen and he had ecchymosis around his ankle. No prior history of PE or DVT. He denies chest pain or shortness of breath. He is on aspirin but no other antiplatelets or anticoagulation.  ROS: See HPI Constitutional: no fever  Eyes: no drainage  ENT: no runny nose   Cardiovascular:  no chest pain  Resp: no SOB  GI: no vomiting GU: no dysuria Integumentary: no rash  Allergy: no hives  Musculoskeletal: Left leg swelling  Neurological: no slurred speech ROS otherwise negative  PAST MEDICAL HISTORY/PAST SURGICAL HISTORY:  Past Medical History  Diagnosis Date  . Hypertension 11/26/2010  . Hyperlipidemia 11/26/2009  . Cancer 07/02/10    prostate    MEDICATIONS:  Prior to Admission medications   Medication Sig Start Date End Date Taking? Authorizing Provider  amLODipine (NORVASC) 5 MG tablet TAKE 1 TABLET BY MOUTH EVERY NIGHT AT BEDTIME 11/26/14  Yes Susy Frizzle, MD  aspirin 81 MG tablet Take 81 mg by mouth at bedtime.   Yes Historical Provider, MD  atorvastatin (LIPITOR) 10 MG tablet Take 1 tablet (10 mg total) by mouth daily. 12/16/13  Yes Mary B Dixon, PA-C  ibuprofen (ADVIL,MOTRIN) 400 MG tablet Take 400 mg by mouth every 6 (six) hours as needed.   Yes Historical Provider, MD  losartan (COZAAR) 100 MG tablet Take 100 mg  by mouth daily.   Yes Historical Provider, MD  Multiple Vitamin (MULTIVITAMIN) tablet Take 1 tablet by mouth daily.   Yes Historical Provider, MD  sildenafil (REVATIO) 20 MG tablet Take 20 mg by mouth as needed.   Yes Historical Provider, MD    ALLERGIES:  No Known Allergies  SOCIAL HISTORY:  History  Substance Use Topics  . Smoking status: Former Research scientist (life sciences)  . Smokeless tobacco: Never Used  . Alcohol Use: No    FAMILY HISTORY: Family History  Problem Relation Age of Onset  . Heart disease Brother 54    Stent at 54--CAD    EXAM: BP 139/89 mmHg  Pulse 84  Temp(Src) 98 F (36.7 C) (Oral)  Resp 20  Ht 5\' 10"  (1.778 m)  Wt 190 lb (86.183 kg)  BMI 27.26 kg/m2  SpO2 99% CONSTITUTIONAL: Alert and oriented and responds appropriately to questions. Well-appearing; well-nourished HEAD: Normocephalic EYES: Conjunctivae clear, PERRL ENT: normal nose; no rhinorrhea; moist mucous membranes; pharynx without lesions noted NECK: Supple, no meningismus, no LAD  CARD: RRR; S1 and S2 appreciated; no murmurs, no clicks, no rubs, no gallops RESP: Normal chest excursion without splinting or tachypnea; breath sounds clear and equal bilaterally; no wheezes, no rhonchi, no rales, no hypoxia or respiratory distress, speaking full sentences ABD/GI: Normal bowel sounds; non-distended; soft, non-tender, no rebound, no guarding, no peritoneal signs BACK:  The back appears normal  and is non-tender to palpation, there is no CVA tenderness EXT: Patient has a tender area over the proximal left tibia that is swollen without obvious bony deformity, tender over the medial malleolus on the left side with swelling and ecchymosis, no ligamentous laxity, compartments are soft, Normal ROM in all joints; otherwise extreme bizarre non-tender to palpation; no edema; normal capillary refill; no cyanosis; patient does have some left-sided calf tenderness and his leg is significantly swollen compared to the right side, 2+ DP  pulses bilaterally  SKIN: Normal color for age and race; warm NEURO: Moves all extremities equally, sensation to light touch intact diffusely, cranial nerves II through XII intact, normal gait PSYCH: The patient's mood and manner are appropriate. Grooming and personal hygiene are appropriate.  MEDICAL DECISION MAKING: Patient here with injury to the left leg that occurred one week ago. He does have a swollen area over the proximal tibia and now has swelling down the leg and ecchymosis in the ankle. This may be a hematoma with ecchymosis and swelling worsening down the leg secondary to gravity. Less likely DVT but given his history of recent flight and prior history of cancer, will obtain a venous Doppler. We'll also obtain x-rays of his tibia/fibula, ankle. He declines pain medication at this time. Compartment are soft and he is neurovascularly intact distally. No signs of infection.  ED PROGRESS: Venous Doppler shows no sign of DVT. X-rays show no fracture or dislocation. Suspect symptoms secondary to small hematoma from recent injury. Advised to use Tylenol or ibuprofen for pain at home as needed. Recommend ice, elevation. Discussed return precautions. He verbalizes understanding and is comfortable with this plan.     [place], DO 05/05/15 [phone]

## 2015-05-05 NOTE — Discharge Instructions (Signed)
Contusion A contusion is a deep bruise. Contusions are the result of an injury that caused bleeding under the skin. The contusion may turn blue, purple, or yellow. Minor injuries will give you a painless contusion, but more severe contusions may stay painful and swollen for a few weeks.  CAUSES  A contusion is usually caused by a blow, trauma, or direct force to an area of the body. SYMPTOMS   Swelling and redness of the injured area.  Bruising of the injured area.  Tenderness and soreness of the injured area.  Pain. DIAGNOSIS  The diagnosis can be made by taking a history and physical exam. An X-ray, CT scan, or MRI may be needed to determine if there were any associated injuries, such as fractures. TREATMENT  Specific treatment will depend on what area of the body was injured. In general, the best treatment for a contusion is resting, icing, elevating, and applying cold compresses to the injured area. Over-the-counter medicines may also be recommended for pain control. Ask your caregiver what the best treatment is for your contusion. HOME CARE INSTRUCTIONS   Put ice on the injured area.  Put ice in a plastic bag.  Place a towel between your skin and the bag.  Leave the ice on for 15-20 minutes, 3-4 times a day, or as directed by your health care provider.  Only take over-the-counter or prescription medicines for pain, discomfort, or fever as directed by your caregiver. Your caregiver may recommend avoiding anti-inflammatory medicines (aspirin, ibuprofen, and naproxen) for 48 hours because these medicines may increase bruising.  Rest the injured area.  If possible, elevate the injured area to reduce swelling. SEEK IMMEDIATE MEDICAL CARE IF:   You have increased bruising or swelling.  You have pain that is getting worse.  Your swelling or pain is not relieved with medicines. MAKE SURE YOU:   Understand these instructions.  Will watch your condition.  Will get help right  away if you are not doing well or get worse. Document Released: 07/18/2005 Document Revised: 10/13/2013 Document Reviewed: 08/13/2011 [facility] Patient Information 2015 [place], [place]. This information is not intended to replace advice given to you by your health care provider. Make sure you discuss any questions you have with your health care provider.  Hematoma A hematoma is a collection of blood under the skin, in an organ, in a body space, in a joint space, or in other tissue. The blood can clot to form a lump that you can see and feel. The lump is often firm and may sometimes become sore and tender. Most hematomas get better in a few days to weeks. However, some hematomas may be serious and require medical care. Hematomas can range in size from very small to very large. CAUSES  A hematoma can be caused by a blunt or penetrating injury. It can also be caused by spontaneous leakage from a blood vessel under the skin. Spontaneous leakage from a blood vessel is more likely to occur in older people, especially those taking blood thinners. Sometimes, a hematoma can develop after certain medical procedures. SIGNS AND SYMPTOMS   A firm lump on the body.  Possible pain and tenderness in the area.  Bruising.Blue, dark blue, purple-red, or yellowish skin may appear at the site of the hematoma if the hematoma is close to the surface of the skin. For hematomas in deeper tissues or body spaces, the signs and symptoms may be subtle. For example, an intra-abdominal hematoma may cause abdominal pain, weakness, fainting,  and shortness of breath. An intracranial hematoma may cause a headache or symptoms such as weakness, trouble speaking, or a change in consciousness. DIAGNOSIS  A hematoma can usually be diagnosed based on your medical history and a physical exam. Imaging tests may be needed if your health care provider suspects a hematoma in deeper tissues or body spaces, such as the abdomen, head, or chest.  These tests may include ultrasonography or a CT scan.  TREATMENT  Hematomas usually go away on their own over time. Rarely does the blood need to be drained out of the body. Large hematomas or those that may affect vital organs will sometimes need surgical drainage or monitoring. HOME CARE INSTRUCTIONS   Apply ice to the injured area:   Put ice in a plastic bag.   Place a towel between your skin and the bag.   Leave the ice on for 20 minutes, 2-3 times a day for the first 1 to 2 days.   After the first 2 days, switch to using warm compresses on the hematoma.   Elevate the injured area to help decrease pain and swelling. Wrapping the area with an elastic bandage may also be helpful. Compression helps to reduce swelling and promotes shrinking of the hematoma. Make sure the bandage is not wrapped too tight.   If your hematoma is on a lower extremity and is painful, crutches may be helpful for a couple days.   Only take over-the-counter or prescription medicines as directed by your health care provider. SEEK IMMEDIATE MEDICAL CARE IF:   You have increasing pain, or your pain is not controlled with medicine.   You have a fever.   You have worsening swelling or discoloration.   Your skin over the hematoma breaks or starts bleeding.   Your hematoma is in your chest or abdomen and you have weakness, shortness of breath, or a change in consciousness.  Your hematoma is on your scalp (caused by a fall or injury) and you have a worsening headache or a change in alertness or consciousness. MAKE SURE YOU:   Understand these instructions.  Will watch your condition.  Will get help right away if you are not doing well or get worse. Document Released: 05/22/2004 Document Revised: 06/10/2013 Document Reviewed: 03/18/2013 [facility] Patient Information 2015 [place], [place]. This information is not intended to replace advice given to you by your health care provider. Make sure you  discuss any questions you have with your health care provider.   RICE: Routine Care for Injuries The routine care of many injuries includes Rest, Ice, Compression, and Elevation (RICE). HOME CARE INSTRUCTIONS  Rest is needed to allow your body to heal. Routine activities can usually be resumed when comfortable. Injured tendons and bones can take up to 6 weeks to heal. Tendons are the cord-like structures that attach muscle to bone.  Ice following an injury helps keep the swelling down and reduces pain.  Put ice in a plastic bag.  Place a towel between your skin and the bag.  Leave the ice on for 15-20 minutes, 3-4 times a day, or as directed by your health care provider. Do this while awake, for the first 24 to 48 hours. After that, continue as directed by your caregiver.  Compression helps keep swelling down. It also gives support and helps with discomfort. If an elastic bandage has been applied, it should be removed and reapplied every 3 to 4 hours. It should not be applied tightly, but firmly enough to keep  swelling down. Watch fingers or toes for swelling, bluish discoloration, coldness, numbness, or excessive pain. If any of these problems occur, remove the bandage and reapply loosely. Contact your caregiver if these problems continue.  Elevation helps reduce swelling and decreases pain. With extremities, such as the arms, hands, legs, and feet, the injured area should be placed near or above the level of the heart, if possible. SEEK IMMEDIATE MEDICAL CARE IF:  You have persistent pain and swelling.  You develop redness, numbness, or unexpected weakness.  Your symptoms are getting worse rather than improving after several days. These symptoms may indicate that further evaluation or further X-rays are needed. Sometimes, X-rays may not show a small broken bone (fracture) until 1 week or 10 days later. Make a follow-up appointment with your caregiver. Ask when your X-ray results will be  ready. Make sure you get your X-ray results. Document Released: 01/20/2001 Document Revised: 10/13/2013 Document Reviewed: 03/09/2011 [facility] Patient Information 2015 [place], [place]. This information is not intended to replace advice given to you by your health care provider. Make sure you discuss any questions you have with your health care provider.

## 2016-01-04 IMAGING — US US EXTREM LOW VENOUS*L*
1 series · 13 of 24 positions shown · non-contrast
Comparison: None.

CLINICAL DATA: Left lower extremity swelling. Pain. Recent air
travel 5 days ago. Color changes.



[Series 1: us extrem low venous*left* · 0.10mm/px · 13 of 32 slices shown]
[im 1/32]
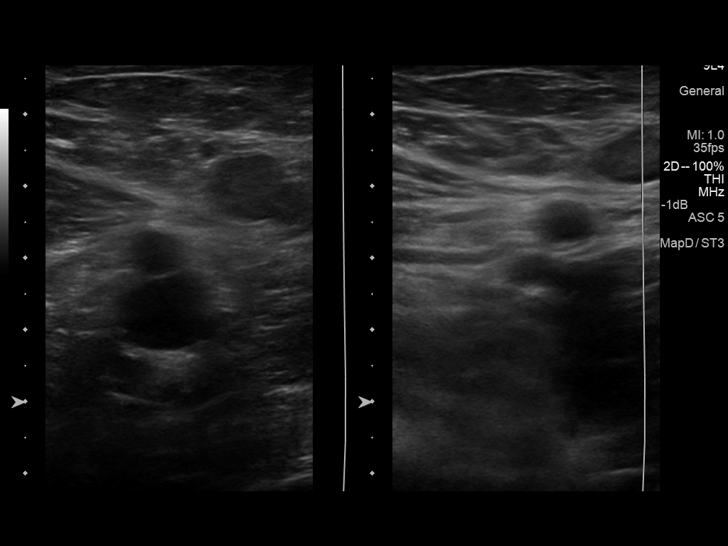
[im 3/32]
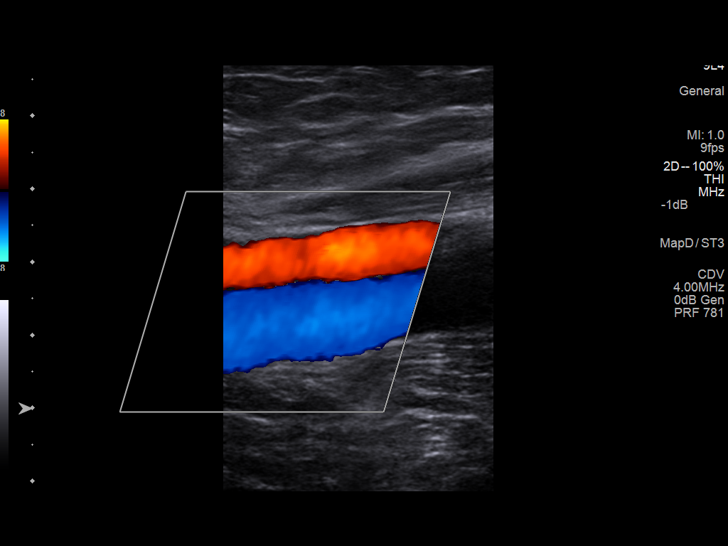
[im 6/32]
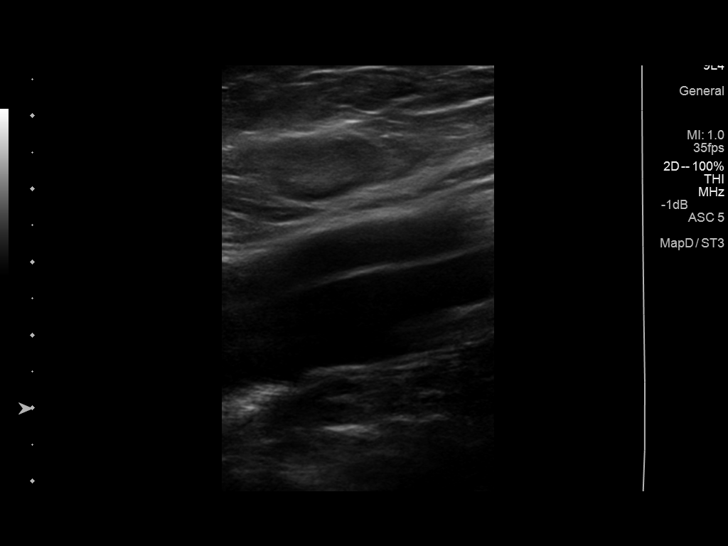
[im 9/32]
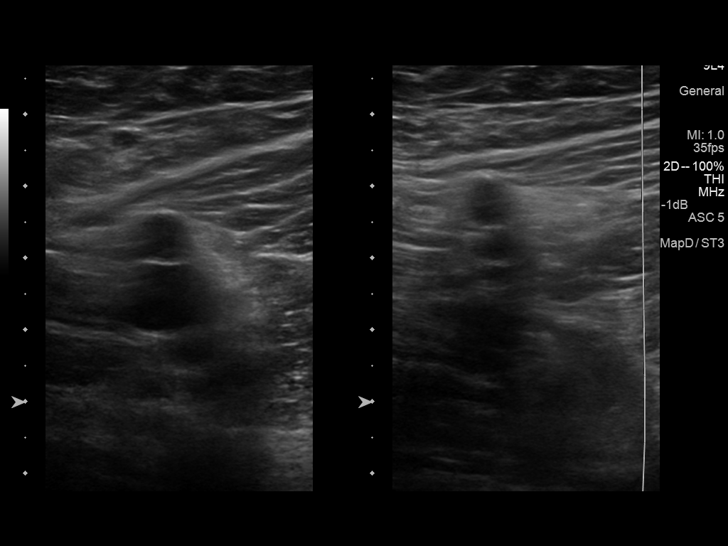
[im 11/32]
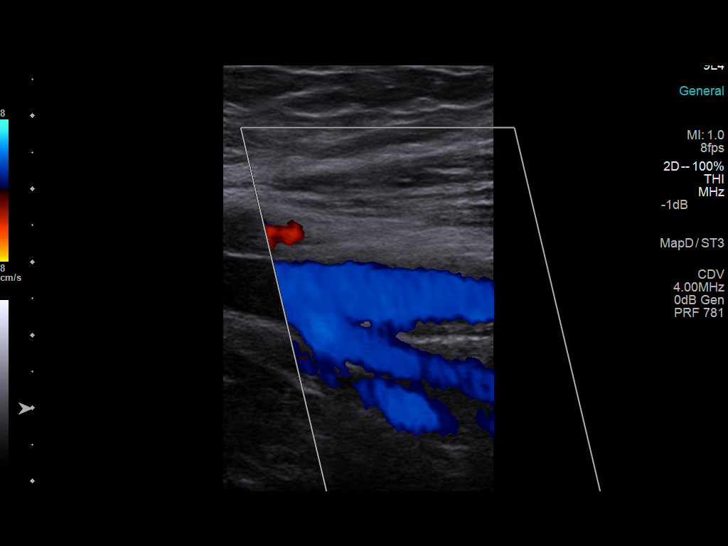
[im 14/32]
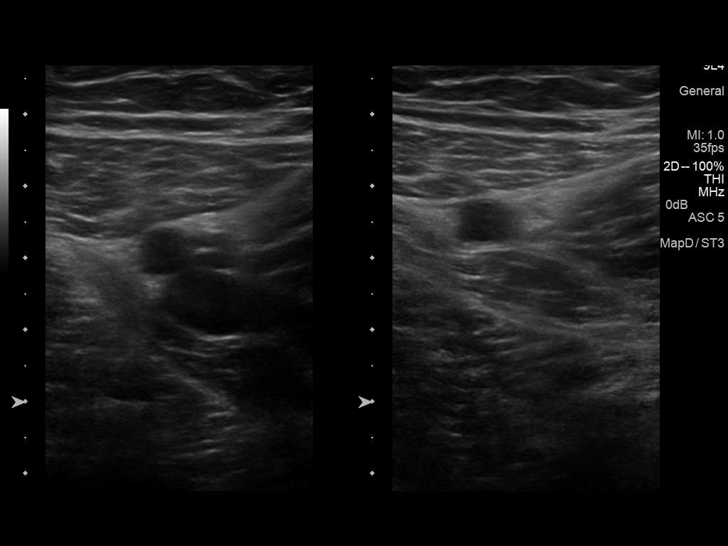
[im 17/32]
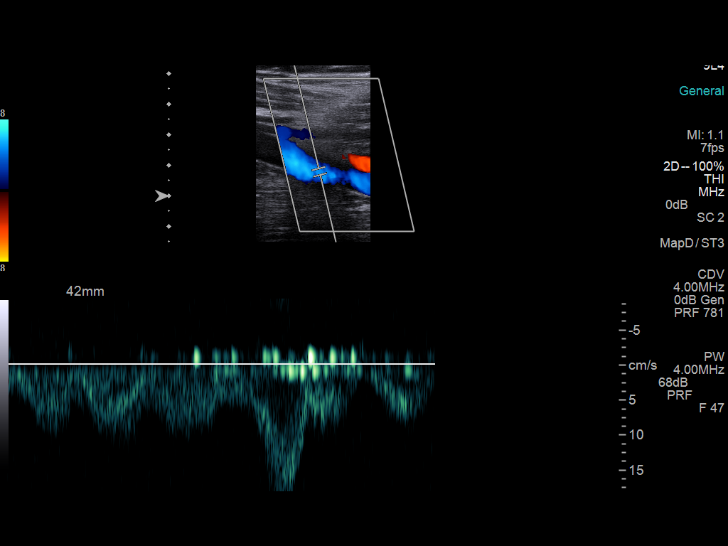
[im 18/32]
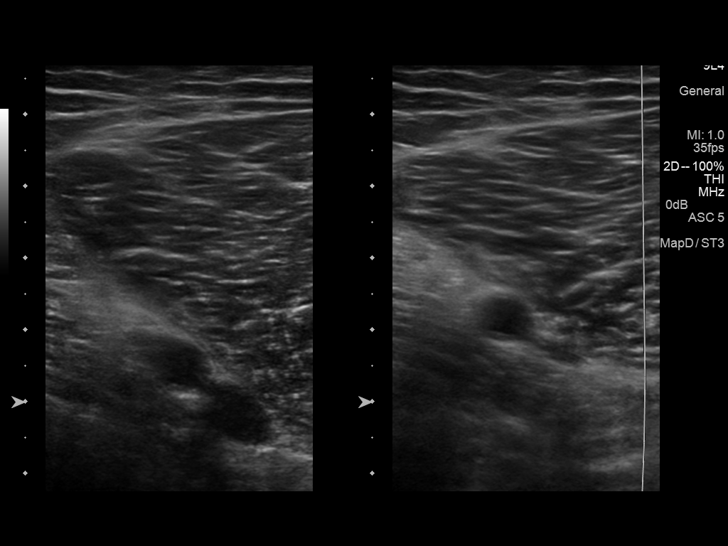
[im 21/32]
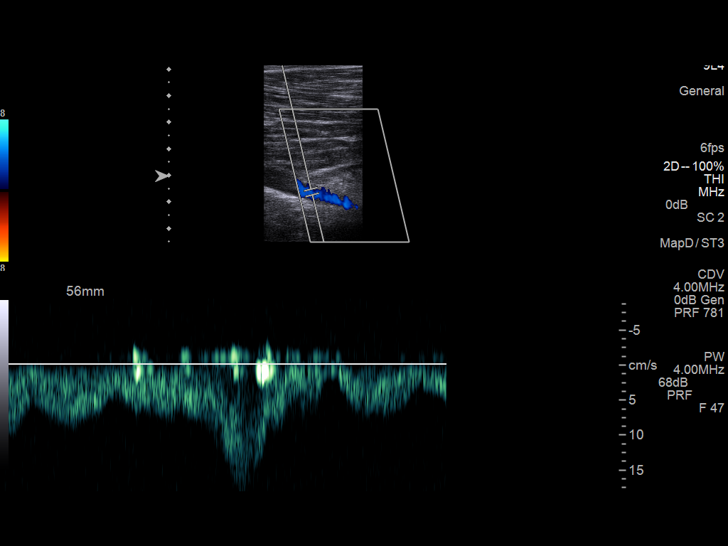
[im 23/32]
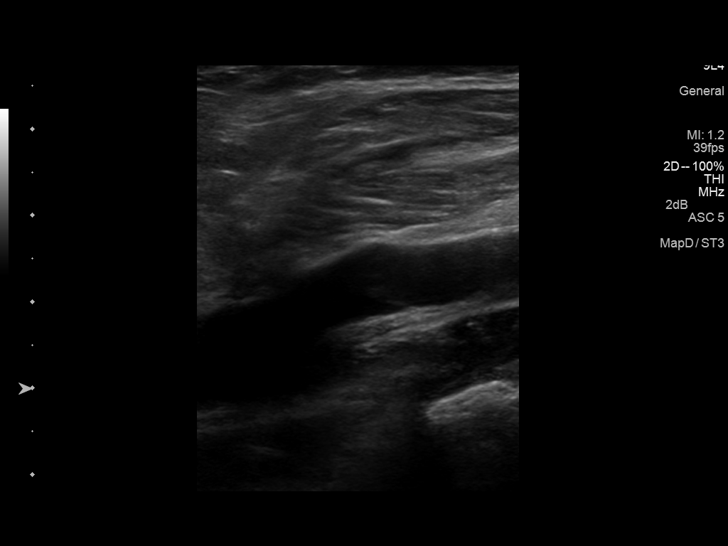
[im 26/32]
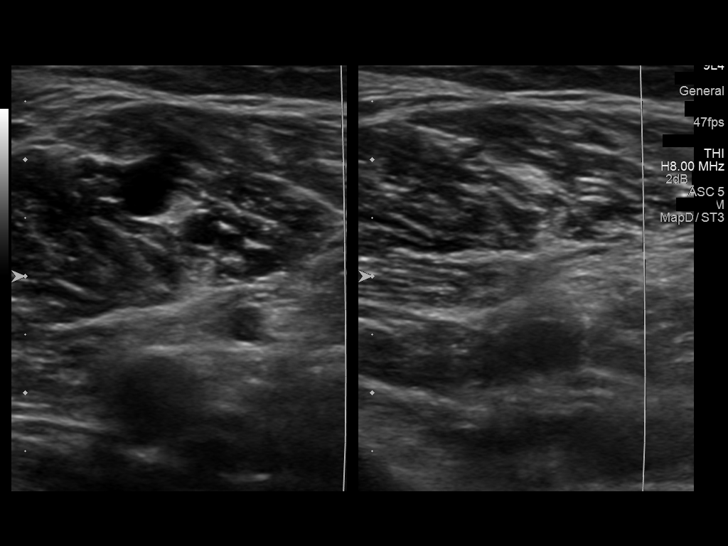
[im 29/32]
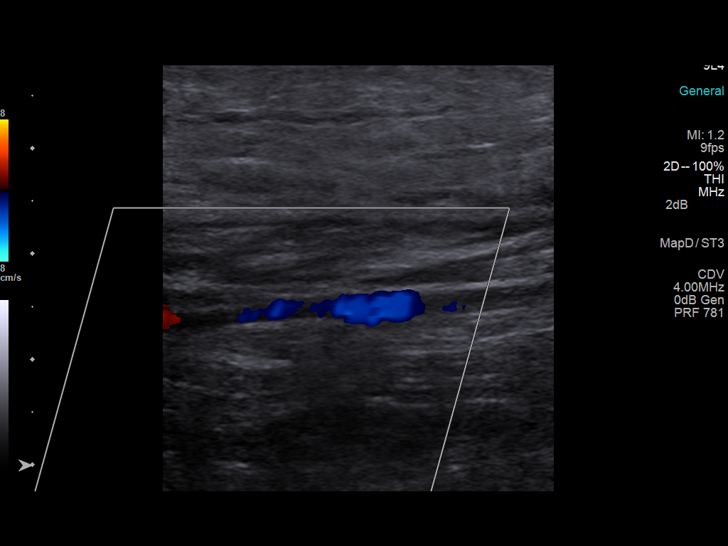
[im 32/32]
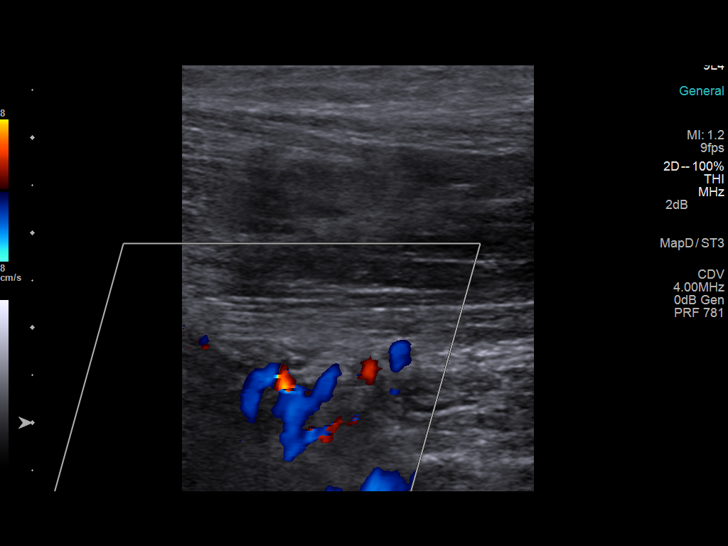

[13 of 24 positions shown; findings below may reference images not displayed]

FINDINGS: Contralateral Common Femoral Vein: Respiratory phasicity is normal
and symmetric with the symptomatic side. No evidence of thrombus.
Normal compressibility.

Common Femoral Vein: No evidence of thrombus. Normal
compressibility, respiratory phasicity and response to augmentation.

Saphenofemoral Junction: No evidence of thrombus. Normal
compressibility and flow on color Doppler imaging.

Profunda Femoral Vein: No evidence of thrombus. Normal
compressibility and flow on color Doppler imaging.

Femoral Vein: No evidence of thrombus. Normal compressibility,
respiratory phasicity and response to augmentation.

Popliteal Vein: No evidence of thrombus. Normal compressibility,
respiratory phasicity and response to augmentation.

Calf Veins: No evidence of thrombus. Normal compressibility and flow
on color Doppler imaging.

Superficial Great Saphenous Vein: No evidence of thrombus. Normal
compressibility and flow on color Doppler imaging.

Venous Reflux:  None.

Other Findings:  None.
IMPRESSION: No evidence of deep venous thrombosis.

## 2016-02-03 IMAGING — DX DG ANKLE COMPLETE 3+V*L*
3 series · 3 of 3 positions shown · non-contrast
Comparison: None.

CLINICAL DATA: Increasing pain and swelling of the left lower
extremity after injury last [REDACTED].

EXAM:
LEFT ANKLE COMPLETE - 3+ VIEW

[ankle ap]
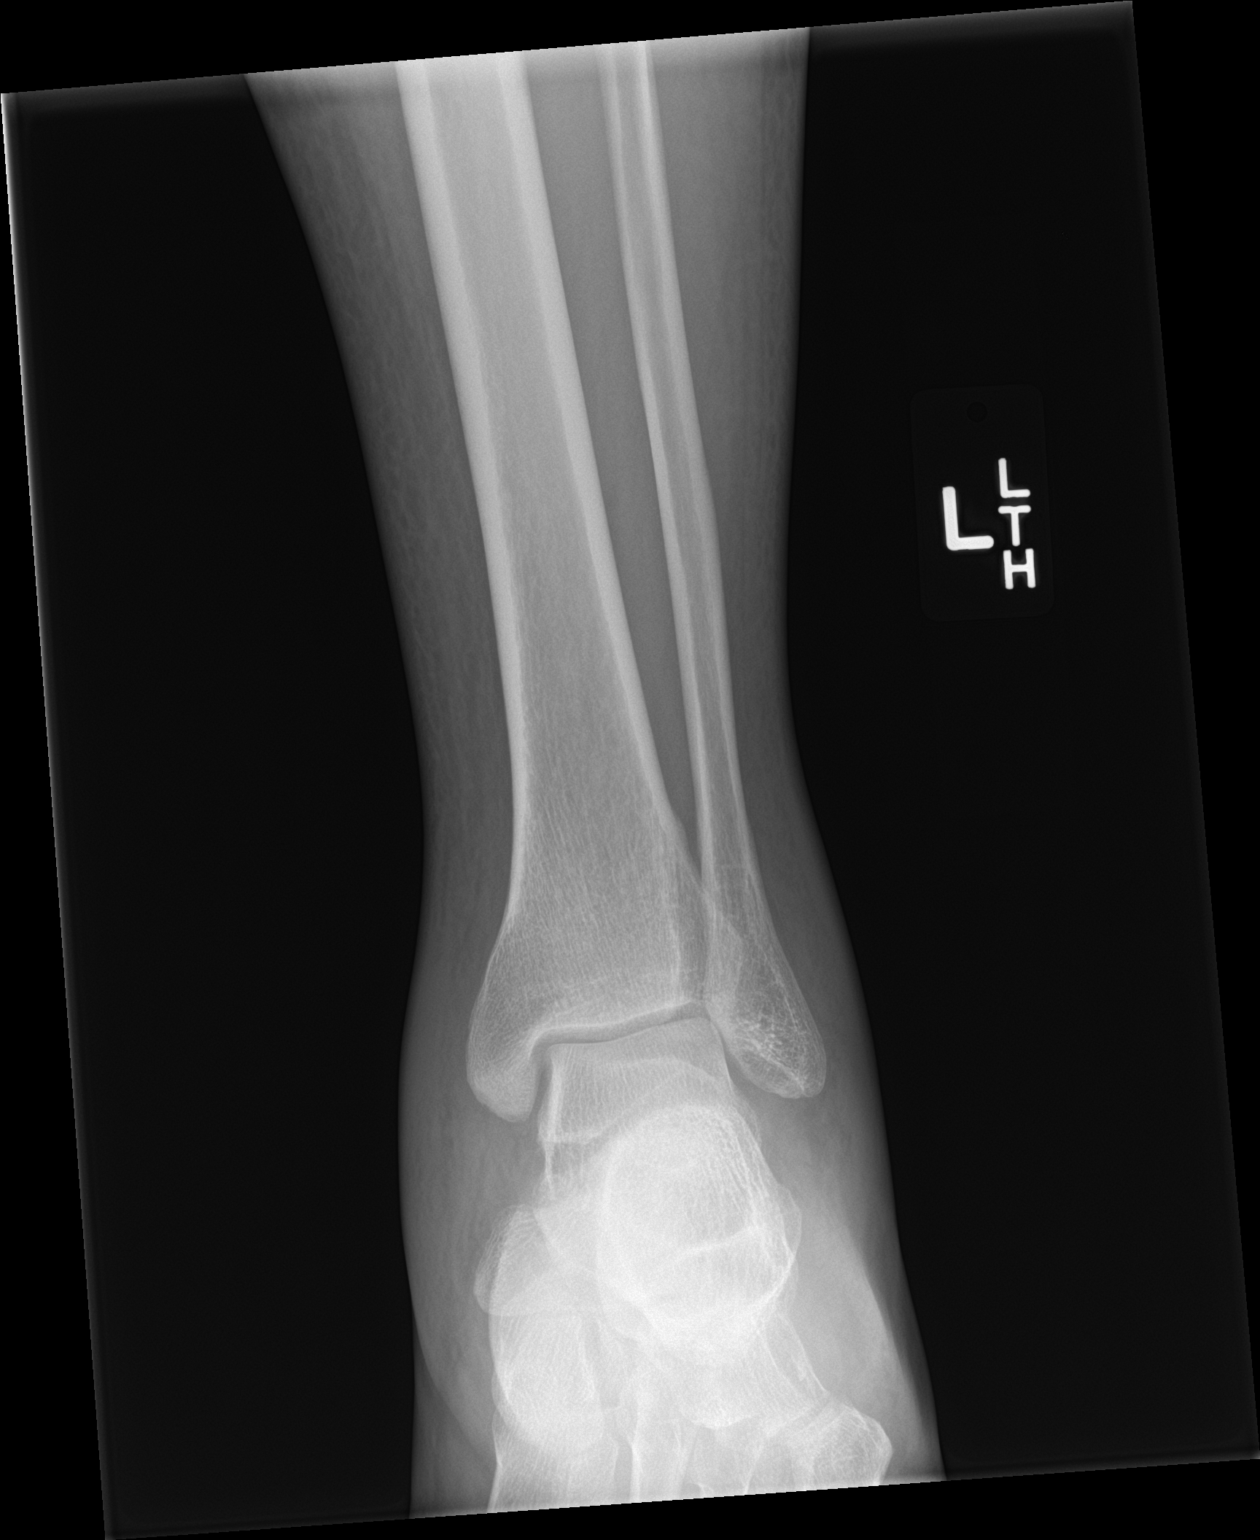

[ankle obl]
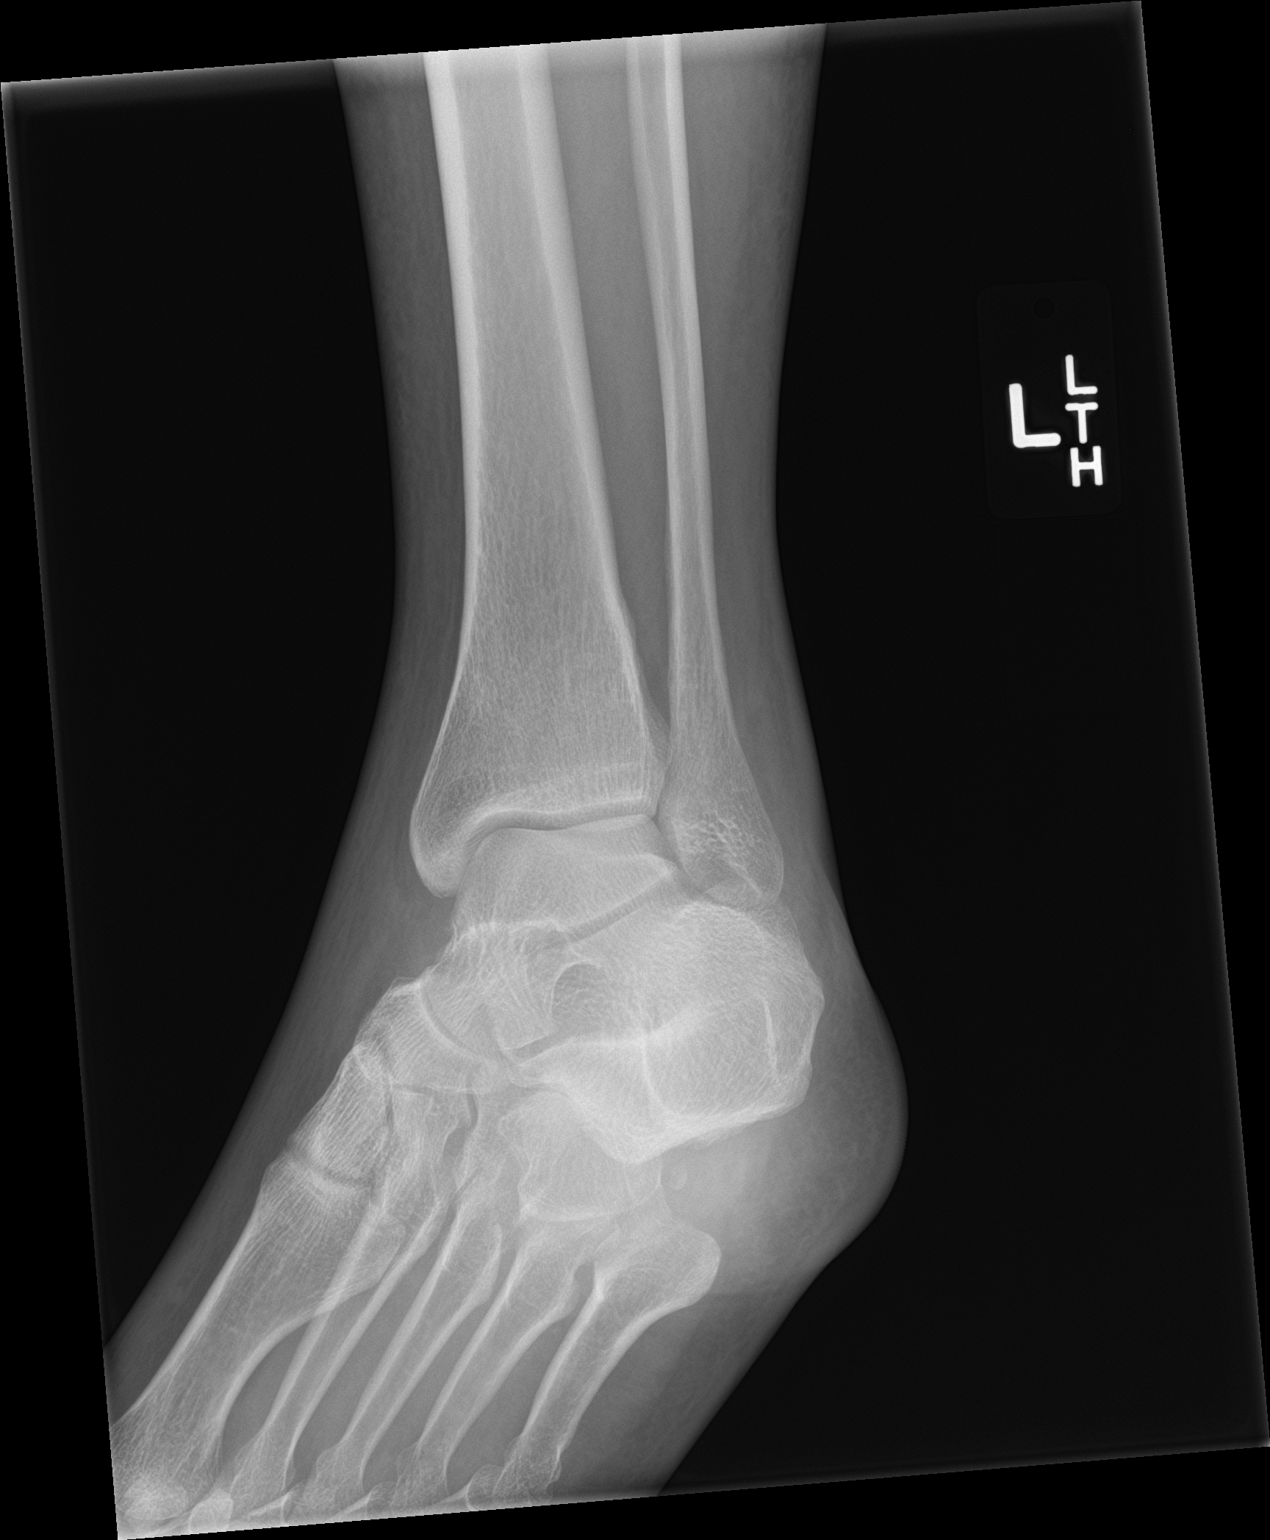

[ankle lat]
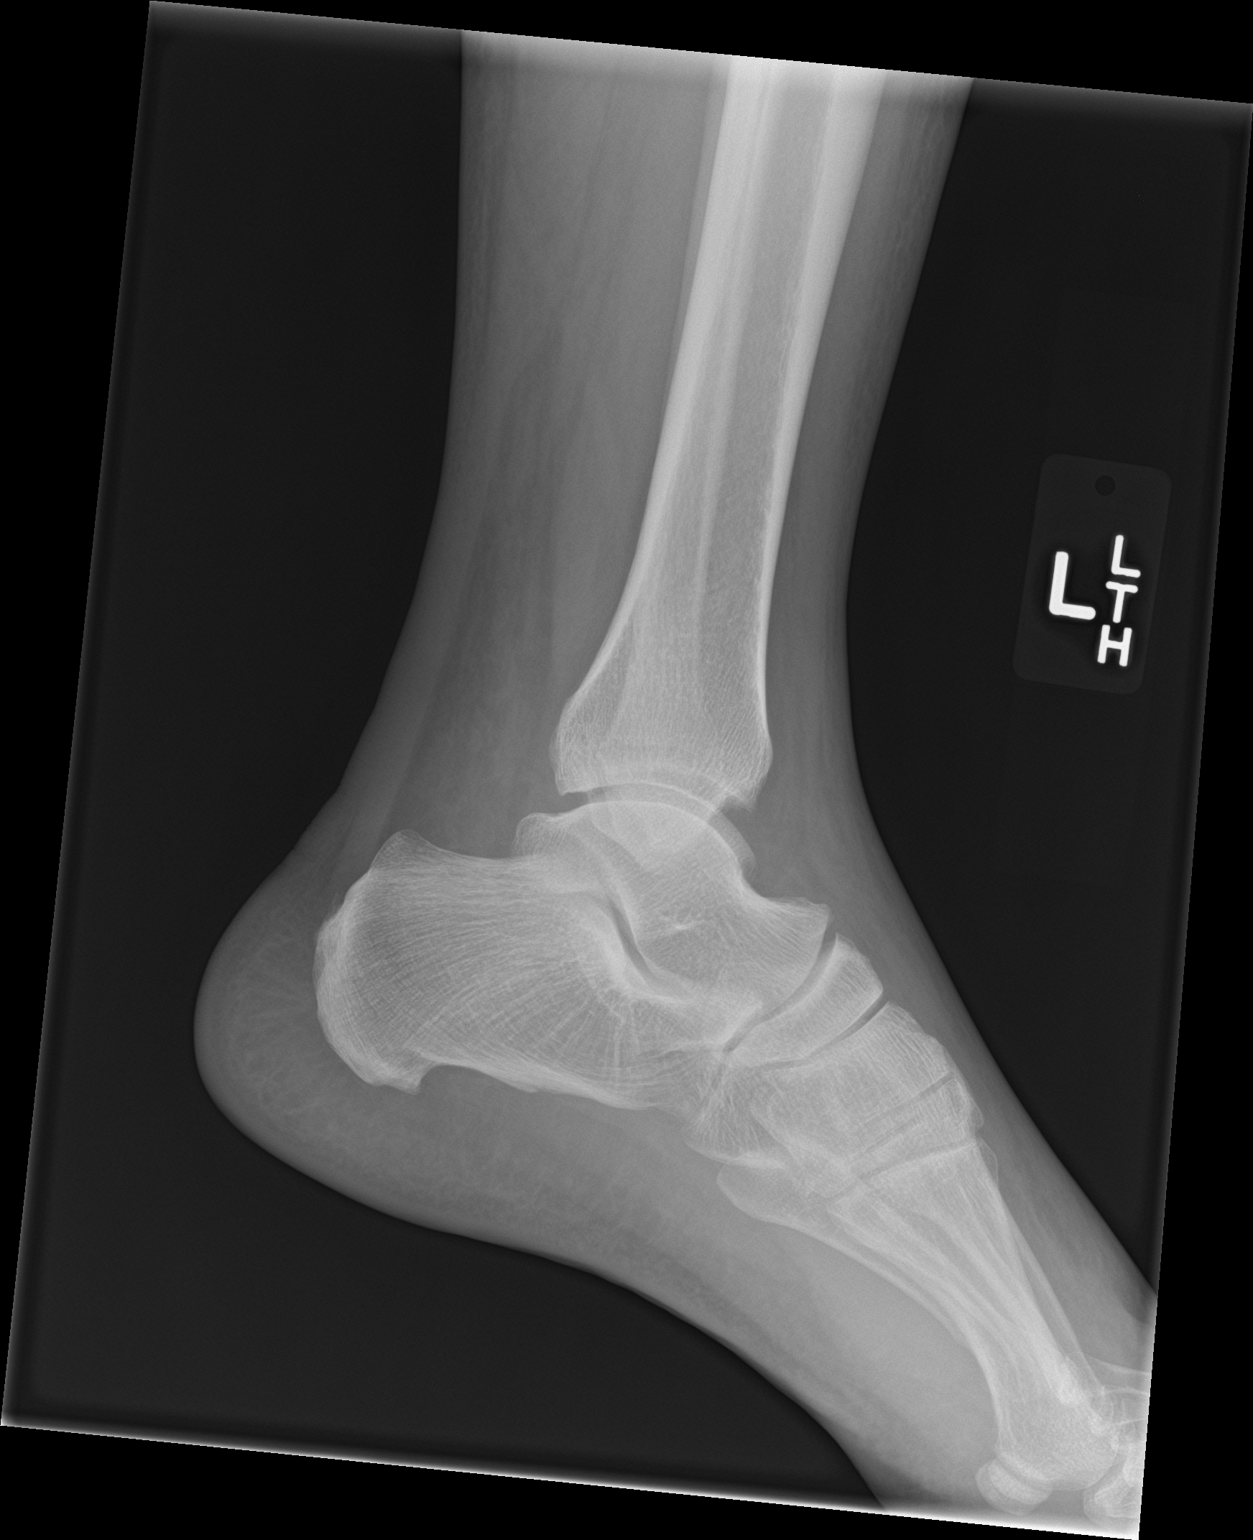

[3 of 3 positions shown; findings below may reference images not displayed]

FINDINGS: There is no evidence of fracture, dislocation, or joint effusion.
There is no evidence of arthropathy or other focal bone abnormality.
Soft tissues are unremarkable.
IMPRESSION: Negative.

## 2016-02-03 IMAGING — DX DG TIBIA/FIBULA 2V*L*
2 series · 2 of 2 positions shown · non-contrast
Comparison: None.

CLINICAL DATA: Patient accidentally hit his left leg with a board
on [REDACTED]. Since then has gotten more painful and more swollen with
bruising present.

EXAM:
LEFT TIBIA AND FIBULA - 2 VIEW

[tibia ap]
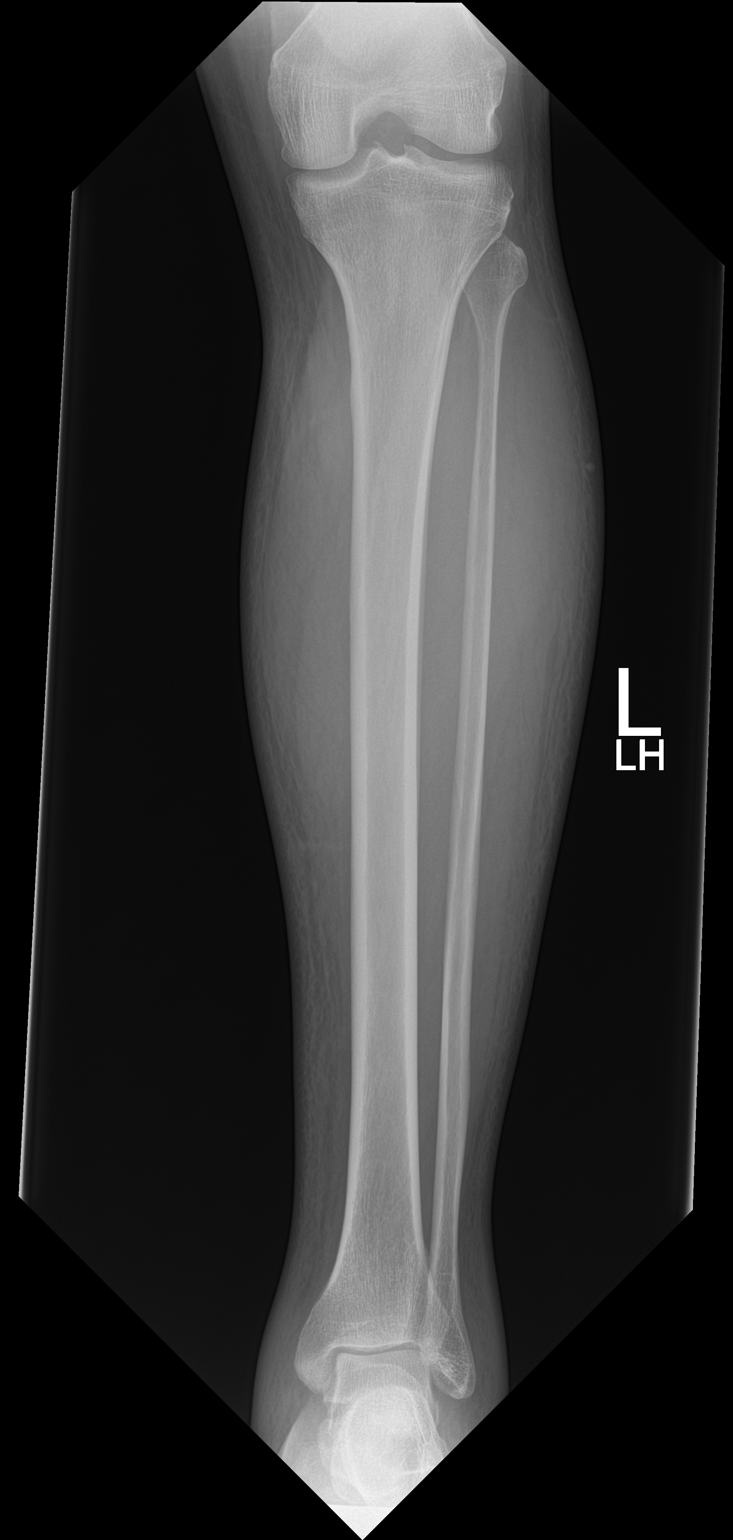

[tibia lat]
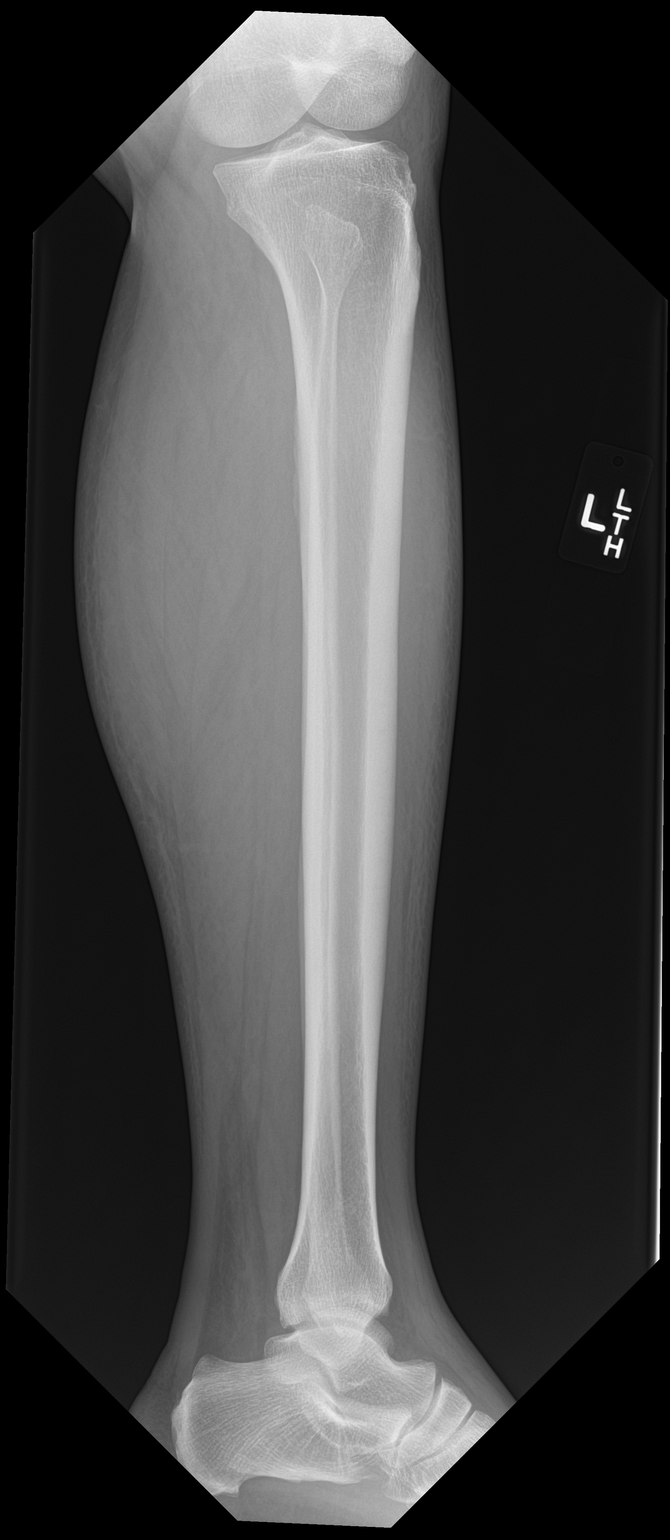

[2 of 2 positions shown; findings below may reference images not displayed]

FINDINGS: There is no evidence of fracture or other focal bone lesions. Soft
tissues are unremarkable.
IMPRESSION: Negative.

## 2016-11-01 ENCOUNTER — Encounter: Payer: Self-pay | Admitting: Family Medicine

## 2016-11-01 ENCOUNTER — Encounter (INDEPENDENT_AMBULATORY_CARE_PROVIDER_SITE_OTHER): Payer: Self-pay

## 2016-11-01 ENCOUNTER — Ambulatory Visit (INDEPENDENT_AMBULATORY_CARE_PROVIDER_SITE_OTHER): Payer: Managed Care, Other (non HMO) | Admitting: Family Medicine

## 2016-11-01 VITALS — BP 110/80 | HR 68 | Temp 98.4°F | Resp 16 | Ht 70.0 in | Wt 204.0 lb

## 2016-11-01 DIAGNOSIS — Z0000 Encounter for general adult medical examination without abnormal findings: Secondary | ICD-10-CM

## 2016-11-01 DIAGNOSIS — E7800 Pure hypercholesterolemia, unspecified: Secondary | ICD-10-CM | POA: Diagnosis not present

## 2016-11-01 DIAGNOSIS — M6530 Trigger finger, unspecified finger: Secondary | ICD-10-CM

## 2016-11-01 DIAGNOSIS — Z23 Encounter for immunization: Secondary | ICD-10-CM | POA: Diagnosis not present

## 2016-11-01 DIAGNOSIS — I10 Essential (primary) hypertension: Secondary | ICD-10-CM

## 2016-11-01 LAB — COMPLETE METABOLIC PANEL WITH GFR
ALBUMIN: 4.3 g/dL (ref 3.6–5.1)
ALT: 26 U/L (ref 9–46)
AST: 21 U/L (ref 10–35)
Alkaline Phosphatase: 47 U/L (ref 40–115)
BILIRUBIN TOTAL: 0.5 mg/dL (ref 0.2–1.2)
BUN: 10 mg/dL (ref 7–25)
CALCIUM: 9.6 mg/dL (ref 8.6–10.3)
CO2: 23 mmol/L (ref 20–31)
CREATININE: 1.19 mg/dL (ref 0.70–1.25)
Chloride: 104 mmol/L (ref 98–110)
GFR, EST AFRICAN AMERICAN: 76 mL/min (ref >=60)
GFR, Est Non African American: 66 mL/min (ref >=60)
Glucose, Bld: 85 mg/dL (ref 70–99)
Potassium: 4.5 mmol/L (ref 3.5–5.3)
Sodium: 138 mmol/L (ref 135–146)
TOTAL PROTEIN: 7.1 g/dL (ref 6.1–8.1)

## 2016-11-01 LAB — LIPID PANEL
Cholesterol: 174 mg/dL
HDL: 41 mg/dL
LDL Cholesterol: 111 mg/dL — ABNORMAL HIGH
Total CHOL/HDL Ratio: 4.2 ratio
Triglycerides: 110 mg/dL
VLDL: 22 mg/dL

## 2016-11-01 LAB — CBC WITH DIFFERENTIAL/PLATELET
Basophils Absolute: 55 {cells}/uL (ref 0–200)
Basophils Relative: 1 %
Eosinophils Absolute: 110 {cells}/uL (ref 15–500)
Eosinophils Relative: 2 %
HCT: 47.6 % (ref 38.5–50.0)
Hemoglobin: 16.1 g/dL (ref 13.0–17.0)
Lymphocytes Relative: 39 %
Lymphs Abs: 2145 {cells}/uL (ref 850–3900)
MCH: 27.7 pg (ref 27.0–33.0)
MCHC: 33.8 g/dL (ref 32.0–36.0)
MCV: 81.8 fL (ref 80.0–100.0)
MPV: 9.7 fL (ref 7.5–12.5)
Monocytes Absolute: 385 {cells}/uL (ref 200–950)
Monocytes Relative: 7 %
Neutro Abs: 2805 {cells}/uL (ref 1500–7800)
Neutrophils Relative %: 51 %
Platelets: 246 10*3/uL (ref 140–400)
RBC: 5.82 MIL/uL — ABNORMAL HIGH (ref 4.20–5.80)
RDW: 14.8 % (ref 11.0–15.0)
WBC: 5.5 10*3/uL (ref 3.8–10.8)

## 2016-11-01 MED ORDER — BLOOD PRESSURE MONITOR/L CUFF MISC: 0 refills | Status: DC

## 2016-11-01 MED ORDER — ATORVASTATIN CALCIUM 10 MG PO TABS: 10.0000 mg | ORAL_TABLET | Freq: Every day | ORAL | 3 refills | Status: DC

## 2016-11-01 MED ORDER — LOSARTAN POTASSIUM 100 MG PO TABS
100.0000 mg | ORAL_TABLET | Freq: Every day | ORAL | 3 refills | Status: DC
Start: 2016-11-01 — End: 2017-01-11

## 2016-11-01 MED ORDER — AMLODIPINE BESYLATE 5 MG PO TABS: 5.0000 mg | ORAL_TABLET | Freq: Every day | ORAL | 3 refills | Status: DC

## 2016-11-01 MED ORDER — SILDENAFIL CITRATE 20 MG PO TABS: 20.0000 mg | ORAL_TABLET | ORAL | 11 refills | Status: DC | PRN

## 2016-11-01 MED ORDER — MELOXICAM 15 MG PO TABS: 15.0000 mg | ORAL_TABLET | Freq: Every day | ORAL | 0 refills | Status: DC

## 2016-11-01 NOTE — Progress Notes (Signed)
Subjective:    Patient ID: Nathaniel Smith, male    DOB: [birth year], 61 y.o.   MRN: [id number]  HPI Patient is a very pleasant 61 year old African-American male who is here today for a complete physical exam. Medical history is significant for hypertension, hyperlipidemia, a family history of premature coronary artery disease, and prostate cancer. His prostate is monitored by his urologist. His most recent PSA was 0.2. Patient's last colonoscopy was in 2017 and there were benign polyps.[name] He is currently on losartan and amlodipine for hypertension. His blood pressure is much improved on these medications at 110/80. He denies any chest pain shortness of breath or dyspnea on exertion. His most recent lab work as listed below:  Past Medical History:  Diagnosis Date  . Cancer ([place]) 07/02/10   prostate  . Hyperlipidemia 11/26/2009  . Hypertension 11/26/2010   Past Surgical History:  Procedure Laterality Date  . CARPAL TUNNEL RELEASE Right 10/22/2006   [facility]  . PROSTATE SURGERY  09/2010   seeds implant   Current Outpatient Prescriptions on File Prior to Visit  Medication Sig Dispense Refill  . aspirin 81 MG tablet Take 81 mg by mouth at bedtime.    [name] ibuprofen (ADVIL,MOTRIN) 400 MG tablet Take 400 mg by mouth every 6 (six) hours as needed.    . Multiple Vitamin (MULTIVITAMIN) tablet Take 1 tablet by mouth daily.     No current facility-administered medications on file prior to visit.    No Known Allergies Social History   Social History  . Marital status: Divorced    Spouse name: N/A  . Number of children: N/A  . Years of education: N/A   Occupational History  . Not on file.   Social History Main Topics  . Smoking status: Former Research scientist (life sciences)  . Smokeless tobacco: Never Used  . Alcohol use No  . Drug use: No  . Sexual activity: Yes    Birth control/ protection: Condom   Other Topics Concern  . Not on file   Social History Narrative   He drives dump trucks. Hauls  rock, dirt, etc.   Divorced. Has one son and one stepdaughter.   :Walks on  [place]mill [place] 1 mile every morning Monday through Friday.   He smoked 1/2 pack a day starting at age 61 and quit at age 49.--smoked for about 26 years.    Has Smoked none for 13 years.   Family History  Problem Relation Age of Onset  . Heart disease Brother 46    Stent at 54--CAD      Review of Systems  All other systems reviewed and are negative.      Objective:   Physical Exam  Constitutional: He is oriented to person, place, and time. He appears well-developed and well-nourished. No distress.  HENT:  Head: Normocephalic and atraumatic.  Right Ear: External ear normal.  Left Ear: External ear normal.  Nose: Nose normal.  Mouth/Throat: Oropharynx is clear and moist. No oropharyngeal exudate.  Eyes: Conjunctivae and EOM are normal. Pupils are equal, round, and reactive to light. Right eye exhibits no discharge. Left eye exhibits no discharge. No scleral icterus.  Neck: Normal range of motion. Neck supple. No JVD present. No tracheal deviation present. No thyromegaly present.  Cardiovascular: Normal rate, regular rhythm, normal heart sounds and intact distal pulses.  Exam reveals no gallop and no friction rub.   No murmur heard. Pulmonary/Chest: Effort normal and breath sounds normal. No stridor. No respiratory distress. He has  no wheezes. He has no rales. He exhibits no tenderness.  Abdominal: Soft. Bowel sounds are normal. He exhibits no distension and no mass. There is no tenderness. There is no rebound and no guarding.  Musculoskeletal: Normal range of motion. He exhibits no edema or tenderness.  Lymphadenopathy:    He has no cervical adenopathy.  Neurological: He is alert and oriented to person, place, and time. He has normal reflexes. No cranial nerve deficit. He exhibits normal muscle tone. Coordination normal.  Skin: Skin is warm. No rash noted. He is not diaphoretic. No erythema. No pallor.    Psychiatric: He has a normal mood and affect. His behavior is normal. Judgment and thought content normal.  Vitals reviewed.         Assessment & Plan:  Benign essential HTN  Pure hypercholesterolemia - Plan: atorvastatin (LIPITOR) 10 MG tablet, CBC with Differential/Platelet, COMPLETE METABOLIC PANEL WITH GFR, Lipid panel  Routine general medical examination at a health care facility  Physical exam today is completely normal. His blood pressure is much improved. He is complaining about a trigger finger in his right fourth digit. I will schedule the patient see a hand specialist as he is already had a cortisone injection near the tendon this year with transient relief only. His colonoscopy is up-to-date. Was performed in April 2016. His urologist just checked his PSA and found to be 0.29 in January of this year. He has been screened for hepatitis the at the [place]. He is due for a flu shot and he consents for the flu shot today. He is also complaining of occasional aches and pains in his joints and we'll treat this symptomatically with meloxicam on an as-needed basis.

## 2016-11-01 NOTE — Addendum Note (Signed)
Addended by: Shary Decamp B on: 11/01/2016 09:52 AM   Modules accepted: Orders

## 2016-11-02 ENCOUNTER — Encounter: Payer: Self-pay | Admitting: Family Medicine

## 2016-11-12 ENCOUNTER — Encounter: Payer: Self-pay | Admitting: Family Medicine

## 2016-11-12 MED ORDER — BLOOD PRESSURE MONITOR/L CUFF MISC: 0 refills | Status: DC

## 2016-11-29 ENCOUNTER — Telehealth: Payer: Self-pay | Admitting: Family Medicine

## 2016-11-29 NOTE — Telephone Encounter (Signed)
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 States that he needs it to say that we do monitor his BP and that no other medications have been added or increased for his BP. Please call him back. [phone]

## 2016-11-29 NOTE — Telephone Encounter (Signed)
[id number] Ref call number Christella Scheuermann rep is needing a code for the blood pressure machine so they can advise patient which monitor is covered under his insurance.  CB# [phone]

## 2016-11-30 ENCOUNTER — Encounter: Payer: Self-pay | Admitting: Family Medicine

## 2016-11-30 NOTE — Telephone Encounter (Signed)
Pt aware to p/u letter

## 2016-11-30 NOTE — Telephone Encounter (Signed)
Letter is on my desk

## 2016-12-05 NOTE — Telephone Encounter (Signed)
Insurance called back and stated to disregard this message as he is going to order it off [employer].

## 2017-01-07 ENCOUNTER — Encounter: Payer: Self-pay | Admitting: Family Medicine

## 2017-01-09 MED ORDER — AMLODIPINE BESYLATE 5 MG PO TABS: 5.0000 mg | ORAL_TABLET | Freq: Every day | ORAL | 1 refills | Status: DC

## 2017-01-11 ENCOUNTER — Other Ambulatory Visit: Payer: Self-pay

## 2017-01-11 DIAGNOSIS — E7800 Pure hypercholesterolemia, unspecified: Secondary | ICD-10-CM

## 2017-01-11 MED ORDER — ATORVASTATIN CALCIUM 10 MG PO TABS: 10.0000 mg | ORAL_TABLET | Freq: Every day | ORAL | 3 refills | Status: DC

## 2017-01-11 MED ORDER — AMLODIPINE BESYLATE 5 MG PO TABS: 5.0000 mg | ORAL_TABLET | Freq: Every day | ORAL | 3 refills | Status: DC

## 2017-01-11 MED ORDER — LOSARTAN POTASSIUM 100 MG PO TABS: 100.0000 mg | ORAL_TABLET | Freq: Every day | ORAL | 3 refills | Status: DC

## 2017-01-11 NOTE — Telephone Encounter (Signed)
Medication refilled per protocol. 

## 2017-01-13 ENCOUNTER — Encounter: Payer: Self-pay | Admitting: Family Medicine

## 2017-01-13 DIAGNOSIS — E7800 Pure hypercholesterolemia, unspecified: Secondary | ICD-10-CM

## 2017-01-14 MED ORDER — ATORVASTATIN CALCIUM 10 MG PO TABS: 10.0000 mg | ORAL_TABLET | Freq: Every day | ORAL | 3 refills | Status: DC

## 2017-01-14 MED ORDER — AMLODIPINE BESYLATE 5 MG PO TABS: 5.0000 mg | ORAL_TABLET | Freq: Every day | ORAL | 3 refills | Status: DC

## 2017-01-24 ENCOUNTER — Telehealth: Payer: Self-pay | Admitting: Family Medicine

## 2017-01-24 MED ORDER — AMLODIPINE BESYLATE 5 MG PO TABS: 5.0000 mg | ORAL_TABLET | Freq: Every day | ORAL | 3 refills | Status: DC

## 2017-01-24 NOTE — Telephone Encounter (Signed)
5 mg a day

## 2017-01-24 NOTE — Telephone Encounter (Signed)
Need to clarify Amlodipine dose.  Is pt to take one 5 mg tablet daily or 1 1/2 tablet (7.5 mg) daily.  Please advise.

## 2017-02-14 ENCOUNTER — Encounter: Payer: Self-pay | Admitting: Family Medicine

## 2017-02-18 MED ORDER — AMLODIPINE BESYLATE 10 MG PO TABS: 10.0000 mg | ORAL_TABLET | Freq: Every day | ORAL | 3 refills | Status: DC

## 2017-02-18 NOTE — Telephone Encounter (Signed)
Medication called/sent to requested pharmacy  

## 2017-02-25 ENCOUNTER — Ambulatory Visit: Payer: Managed Care, Other (non HMO) | Admitting: Family Medicine

## 2017-03-07 ENCOUNTER — Encounter: Payer: Self-pay | Admitting: Family Medicine

## 2017-03-07 ENCOUNTER — Ambulatory Visit (INDEPENDENT_AMBULATORY_CARE_PROVIDER_SITE_OTHER): Payer: Managed Care, Other (non HMO) | Admitting: Family Medicine

## 2017-03-07 VITALS — BP 126/82 | HR 68 | Temp 98.0°F | Resp 18 | Wt 209.0 lb

## 2017-03-07 DIAGNOSIS — M7541 Impingement syndrome of right shoulder: Secondary | ICD-10-CM

## 2017-03-07 DIAGNOSIS — M6530 Trigger finger, unspecified finger: Secondary | ICD-10-CM

## 2017-03-07 NOTE — Progress Notes (Signed)
Subjective:    Patient ID: Nathaniel Smith, male    DOB: 1956-03-29, 61 y.o.   MRN: [id number]  HPI Patient reports several weeks of pain in his right shoulder. Pain is worse with abduction greater than 90. He is unable to lift his arm over his head without significant pain. He denies any weakness in the arm. He has pain with active range of motion above 80 and passive range of motion above 90. He has a positive empty can sign. He has a positive Hawkins sign. He has a negative Spurling's test. He also has a trigger finger in his left hand. He is previously seen an orthopedist for surgery. He would like to see the same orthopedist again Past Medical History:  Diagnosis Date  . Cancer ([place]) 07/02/10   prostate  . Hyperlipidemia 11/26/2009  . Hypertension 11/26/2010   Past Surgical History:  Procedure Laterality Date  . CARPAL TUNNEL RELEASE Right 10/22/2006   [facility]  . PROSTATE SURGERY  09/2010   seeds implant   Current Outpatient Prescriptions on File Prior to Visit  Medication Sig Dispense Refill  . amLODipine (NORVASC) 10 MG tablet Take 1 tablet (10 mg total) by mouth daily. 90 tablet 3  . aspirin 81 MG tablet Take 81 mg by mouth at bedtime.    [name] atorvastatin (LIPITOR) 10 MG tablet Take 1 tablet (10 mg total) by mouth daily. 90 tablet 3  . Ferrous Sulfate (IRON) 28 MG TABS Take by mouth.    . losartan (COZAAR) 100 MG tablet Take 1 tablet (100 mg total) by mouth daily. 90 tablet 3  . meloxicam (MOBIC) 15 MG tablet Take 1 tablet (15 mg total) by mouth daily. 30 tablet 0  . Multiple Vitamin (MULTIVITAMIN) tablet Take 1 tablet by mouth daily.    . sildenafil (REVATIO) 20 MG tablet Take 1 tablet (20 mg total) by mouth as needed. 10 tablet 11  . ibuprofen (ADVIL,MOTRIN) 400 MG tablet Take 400 mg by mouth every 6 (six) hours as needed.     No current facility-administered medications on file prior to visit.    No Known Allergies Social History   Social History  . Marital  status: Divorced    Spouse name: N/A  . Number of children: N/A  . Years of education: N/A   Occupational History  . Not on file.   Social History Main Topics  . Smoking status: Former Research scientist (life sciences)  . Smokeless tobacco: Never Used  . Alcohol use No  . Drug use: No  . Sexual activity: Yes    Birth control/ protection: Condom   Other Topics Concern  . Not on file   Social History Narrative   He drives dump trucks. Hauls rock, dirt, etc.   Divorced. Has one son and one stepdaughter.   :Walks on  [place] 1 mile every morning Monday through Friday.   He smoked 1/2 pack a day starting at age 29 and quit at age 64.--smoked for about 26 years.    Has Smoked none for 13 years.     Review of Systems  All other systems reviewed and are negative.      Objective:   Physical Exam  Cardiovascular: Normal rate, regular rhythm and normal heart sounds.   Pulmonary/Chest: Effort normal and breath sounds normal.  Musculoskeletal:       Right shoulder: He exhibits decreased range of motion, tenderness and pain. He exhibits no bony tenderness, no crepitus and normal strength.  Vitals  reviewed.         Assessment & Plan:  Impingement syndrome of right shoulder  Trigger finger, acquired - Plan: Ambulatory referral to Orthopedic Surgery  Using sterile technique, I injected the patient's right shoulder with an extra 2 mL of lidocaine, 2 mL of Marcaine, and 2 mL of 40 mg per mL Kenalog. Patient tolerated the procedure well with no complication. I will also consult his orthopedist regarding his recurrent trigger finger in his left hand

## 2017-03-19 ENCOUNTER — Ambulatory Visit: Payer: Managed Care, Other (non HMO) | Admitting: Family Medicine

## 2017-05-27 ENCOUNTER — Encounter: Payer: Self-pay | Admitting: Family Medicine

## 2017-05-30 ENCOUNTER — Encounter: Payer: Self-pay | Admitting: Family Medicine

## 2017-05-30 ENCOUNTER — Ambulatory Visit (INDEPENDENT_AMBULATORY_CARE_PROVIDER_SITE_OTHER): Payer: Managed Care, Other (non HMO) | Admitting: Family Medicine

## 2017-05-30 VITALS — BP 120/80 | HR 84 | Temp 98.3°F | Resp 14 | Ht 70.0 in | Wt 204.0 lb

## 2017-05-30 DIAGNOSIS — L600 Ingrowing nail: Secondary | ICD-10-CM | POA: Diagnosis not present

## 2017-05-30 MED ORDER — HYDROCODONE-ACETAMINOPHEN 5-325 MG PO TABS: 1.0000 | ORAL_TABLET | Freq: Four times a day (QID) | ORAL | 0 refills | Status: DC | PRN

## 2017-05-30 NOTE — Progress Notes (Signed)
Subjective:    Patient ID: Nathaniel Smith, male    DOB: Nov 23, 1955, 61 y.o.   MRN: [id number]  HPI A she presents today with an ingrown toenail. The lateral portion of his right great toe is ingrown and painful. It has been that way for months. Over the last few weeks, he became exquisitely painful. The skin is now erythematous and tender to touch. He is here today requesting removal of the ingrown portion of the toenail. Past Medical History:  Diagnosis Date  . Cancer ([place]) 07/02/10   prostate  . Hyperlipidemia 11/26/2009  . Hypertension 11/26/2010   Past Surgical History:  Procedure Laterality Date  . CARPAL TUNNEL RELEASE Right 10/22/2006   [facility]  . PROSTATE SURGERY  09/2010   seeds implant   Current Outpatient Prescriptions on File Prior to Visit  Medication Sig Dispense Refill  . amLODipine (NORVASC) 10 MG tablet Take 1 tablet (10 mg total) by mouth daily. 90 tablet 3  . aspirin 81 MG tablet Take 81 mg by mouth at bedtime.    [name] atorvastatin (LIPITOR) 10 MG tablet Take 1 tablet (10 mg total) by mouth daily. 90 tablet 3  . Ferrous Sulfate (IRON) 28 MG TABS Take by mouth.    . losartan (COZAAR) 100 MG tablet Take 1 tablet (100 mg total) by mouth daily. 90 tablet 3  . Multiple Vitamin (MULTIVITAMIN) tablet Take 1 tablet by mouth daily.    . sildenafil (REVATIO) 20 MG tablet Take 1 tablet (20 mg total) by mouth as needed. 10 tablet 11   No current facility-administered medications on file prior to visit.    No Known Allergies Social History   Social History  . Marital status: Divorced    Spouse name: N/A  . Number of children: N/A  . Years of education: N/A   Occupational History  . Not on file.   Social History Main Topics  . Smoking status: Former Research scientist (life sciences)  . Smokeless tobacco: Never Used  . Alcohol use No  . Drug use: No  . Sexual activity: Yes    Birth control/ protection: Condom   Other Topics Concern  . Not on file   Social History Narrative   He drives dump trucks. Hauls rock, dirt, etc.   Divorced. Has one son and one stepdaughter.   :Walks on  [place]mill mill for 1 mile every morning Monday through Friday.   He smoked 1/2 pack a day starting at age 33 and quit at age 39.--smoked for about 26 years.    Has Smoked none for 13 years.      Review of Systems  All other systems reviewed and are negative.      Objective:   Physical Exam  Cardiovascular: Normal rate, regular rhythm and normal heart sounds.   Pulmonary/Chest: Effort normal and breath sounds normal. No respiratory distress. He has no wheezes. He has no rales.  Vitals reviewed.  The lateral nail edge of the right great toe is ingrown and the skin is overlapping math and is erythematous tender and swollen.       Assessment & Plan:  Ingrown right big toenail  The toe was anesthetized 0.1% lidocaine without epinephrine. A tourniquet was then applied to the base of the toe. The lateral nail edge was then separated from the underlying nail bed using a metal elevator. A longitudinal cut was then made using a pair of scissors all the way back to the proximal nail fold. Using a pair of hemostats,  the ingrown portion of the nail was removed with simple traction. I then gave the patient the option of applying phenol to try to destroy the underlying nail matrix so the affected portion of the nail would not regrow. He wanted to attempt this. Therefore I coated the surrounding skin with Neosporin to protect it from the phenol. 2 drops of phenol were then applied to the underlying nail bed and nail matrix. Phenol was allowed to soak for possibly 30 seconds. The phenol was then commenced using vigorous irrigation with isopropyl alcohol. Neosporin was then applied to the wound bed, the toe was wrapped with petroleum gauze and Coban. Tourniquet was removed. There was no significant blood loss. Patient tolerated the procedure without complications. He was given a one-time prescription of  hydrocodone/acetaminophen 5/325 one by mouth every 6 hours when necessary pain. He was given 10 tablets.

## 2017-06-13 ENCOUNTER — Encounter: Payer: Self-pay | Admitting: Family Medicine

## 2017-06-17 ENCOUNTER — Ambulatory Visit (INDEPENDENT_AMBULATORY_CARE_PROVIDER_SITE_OTHER): Payer: Managed Care, Other (non HMO) | Admitting: Family Medicine

## 2017-06-17 VITALS — HR 84 | Temp 98.7°F | Resp 14 | Wt 204.0 lb

## 2017-06-17 DIAGNOSIS — L03031 Cellulitis of right toe: Secondary | ICD-10-CM | POA: Diagnosis not present

## 2017-06-17 DIAGNOSIS — L929 Granulomatous disorder of the skin and subcutaneous tissue, unspecified: Secondary | ICD-10-CM

## 2017-06-17 MED ORDER — CEPHALEXIN 500 MG PO CAPS: 500.0000 mg | ORAL_CAPSULE | Freq: Three times a day (TID) | ORAL | 0 refills | Status: DC

## 2017-06-17 MED ORDER — SILVER SULFADIAZINE 1 % EX CREA: 1.0000 "application " | TOPICAL_CREAM | Freq: Every day | CUTANEOUS | 0 refills | Status: DC

## 2017-06-17 NOTE — Progress Notes (Signed)
Subjective:    Patient ID: Nathaniel Smith, male    DOB: June 22, 1956, 61 y.o.   MRN: [id number]  HPI  05/30/17 Patient presents today with an ingrown toenail. The lateral portion of his right great toe is ingrown and painful. It has been that way for months. Over the last few weeks, he became exquisitely painful. The skin is now erythematous and tender to touch. He is here today requesting removal of the ingrown portion of the toenail.  At that time, my plan was: The toe was anesthetized 0.1% lidocaine without epinephrine. A tourniquet was then applied to the base of the toe. The lateral nail edge was then separated from the underlying nail bed using a metal elevator. A longitudinal cut was then made using a pair of scissors all the way back to the proximal nail fold. Using a pair of hemostats, the ingrown portion of the nail was removed with simple traction. I then gave the patient the option of applying phenol to try to destroy the underlying nail matrix so the affected portion of the nail would not regrow. He wanted to attempt this. Therefore I coated the surrounding skin with Neosporin to protect it from the phenol. 2 drops of phenol were then applied to the underlying nail bed and nail matrix. Phenol was allowed to soak for 30 seconds. The phenol was then quenched using vigorous irrigation with isopropyl alcohol. Neosporin was then applied to the wound bed, the toe was wrapped with petroleum gauze and Coban. Tourniquet was removed. There was no significant blood loss. Patient tolerated the procedure without complications. He was given a one-time prescription of hydrocodone/acetaminophen 5/325 one by mouth every 6 hours when necessary pain. He was given 10 tablets.  06/17/17 The patient is here today stating that the toe does not appear to be healing well. Proximal to the toenail, the skin is erythematous and warm and appears to have developed a secondary cellulitis. There is excessive granulation  tissue emanating from the nail bed where the application of phenol was placed. I have recorded an image on my cell phone for documentation.  Granulation tissue was debrided using gauze. The underlying nail bed appears healthy with no evidence of necrosis.  The patient denies pain or discomfort but is concerned due to the prolonged healing time as it has been 18 days. Past Medical History:  Diagnosis Date  . Cancer ([place]) 07/02/10   prostate  . Hyperlipidemia 11/26/2009  . Hypertension 11/26/2010   Past Surgical History:  Procedure Laterality Date  . CARPAL TUNNEL RELEASE Right 10/22/2006   [facility]  . PROSTATE SURGERY  09/2010   seeds implant   Current Outpatient Prescriptions on File Prior to Visit  Medication Sig Dispense Refill  . amLODipine (NORVASC) 10 MG tablet Take 1 tablet (10 mg total) by mouth daily. 90 tablet 3  . aspirin 81 MG tablet Take 81 mg by mouth at bedtime.    [name] atorvastatin (LIPITOR) 10 MG tablet Take 1 tablet (10 mg total) by mouth daily. 90 tablet 3  . Ferrous Sulfate (IRON) 28 MG TABS Take by mouth.    [name] HYDROcodone-acetaminophen (NORCO) 5-325 MG tablet Take 1 tablet by mouth every 6 (six) hours as needed for moderate pain. 10 tablet 0  . losartan (COZAAR) 100 MG tablet Take 1 tablet (100 mg total) by mouth daily. 90 tablet 3  . Multiple Vitamin (MULTIVITAMIN) tablet Take 1 tablet by mouth daily.    . sildenafil (REVATIO) 20 MG tablet Take  1 tablet (20 mg total) by mouth as needed. 10 tablet 11   No current facility-administered medications on file prior to visit.    No Known Allergies Social History   Social History  . Marital status: Divorced    Spouse name: N/A  . Number of children: N/A  . Years of education: N/A   Occupational History  . Not on file.   Social History Main Topics  . Smoking status: Former Research scientist (life sciences)  . Smokeless tobacco: Never Used  . Alcohol use No  . Drug use: No  . Sexual activity: Yes    Birth control/ protection: Condom     Other Topics Concern  . Not on file   Social History Narrative   He drives dump trucks. Hauls rock, dirt, etc.   Divorced. Has one son and one stepdaughter.   :Walks on  [place]mill mill for 1 mile every morning Monday through Friday.   He smoked 1/2 pack a day starting at age 53 and quit at age 54.--smoked for about 26 years.    Has Smoked none for 13 years.      Review of Systems  All other systems reviewed and are negative.      Objective:   Physical Exam  Cardiovascular: Normal rate, regular rhythm and normal heart sounds.   Pulmonary/Chest: Effort normal and breath sounds normal. No respiratory distress. He has no wheezes. He has no rales.  Vitals reviewed.  See description in HPI and photo.        Assessment & Plan:  Cellulitis of toe of right foot  Excessive granulation tissue  I believe the granulation tissue is an abnormal response to the chemical burn created by the phenol. I will treat this area similar to a burn by applying Silvadene daily, covering the wound with petroleum gauze, and wrapping Coban and to keep clean and dry. We discussed dressing changes on a daily basis and keeping the area clean and dry. I will treat the secondary cellulitis with Keflex 500 mg by mouth 3 times a day for 7 days. Recheck the patient later this week to ensure proper healing.

## 2017-06-21 ENCOUNTER — Ambulatory Visit (INDEPENDENT_AMBULATORY_CARE_PROVIDER_SITE_OTHER): Payer: Managed Care, Other (non HMO) | Admitting: Family Medicine

## 2017-06-21 VITALS — BP 130/78 | HR 78 | Temp 98.1°F | Resp 18 | Wt 204.0 lb

## 2017-06-21 DIAGNOSIS — L929 Granulomatous disorder of the skin and subcutaneous tissue, unspecified: Secondary | ICD-10-CM | POA: Diagnosis not present

## 2017-06-21 NOTE — Progress Notes (Signed)
Subjective:    Patient ID: Nathaniel Smith, male    DOB: 04-Jul-1956, 61 y.o.   MRN: [id number]  HPI  05/30/17 Patient presents today with an ingrown toenail. The lateral portion of his right great toe is ingrown and painful. It has been that way for months. Over the last few weeks, he became exquisitely painful. The skin is now erythematous and tender to touch. He is here today requesting removal of the ingrown portion of the toenail.  At that time, my plan was: The toe was anesthetized 0.1% lidocaine without epinephrine. A tourniquet was then applied to the base of the toe. The lateral nail edge was then separated from the underlying nail bed using a metal elevator. A longitudinal cut was then made using a pair of scissors all the way back to the proximal nail fold. Using a pair of hemostats, the ingrown portion of the nail was removed with simple traction. I then gave the patient the option of applying phenol to try to destroy the underlying nail matrix so the affected portion of the nail would not regrow. He wanted to attempt this. Therefore I coated the surrounding skin with Neosporin to protect it from the phenol. 2 drops of phenol were then applied to the underlying nail bed and nail matrix. Phenol was allowed to soak for 30 seconds. The phenol was then quenched using vigorous irrigation with isopropyl alcohol. Neosporin was then applied to the wound bed, the toe was wrapped with petroleum gauze and Coban. Tourniquet was removed. There was no significant blood loss. Patient tolerated the procedure without complications. He was given a one-time prescription of hydrocodone/acetaminophen 5/325 one by mouth every 6 hours when necessary pain. He was given 10 tablets.  06/17/17 The patient is here today stating that the toe does not appear to be healing well. Proximal to the toenail, the skin is erythematous and warm and appears to have developed a secondary cellulitis. There is excessive granulation  tissue emanating from the nail bed where the application of phenol was placed. I have recorded an image on my cell phone for documentation.  Granulation tissue was debrided using gauze. The underlying nail bed appears healthy with no evidence of necrosis.  The patient denies pain or discomfort but is concerned due to the prolonged healing time as it has been 18 days.  At that time, my plan was: I believe the granulation tissue is an abnormal response to the chemical burn created by the phenol. I will treat this area similar to a burn by applying Silvadene daily, covering the wound with petroleum gauze, and wrapping Coban and to keep clean and dry. We discussed dressing changes on a daily basis and keeping the area clean and dry. I will treat the secondary cellulitis with Keflex 500 mg by mouth 3 times a day for 7 days. Recheck the patient later this week to ensure proper healing.  06/21/17 Patient is here today for recheck. The toe looks much better than it did previously. The distal portion of the exposed nail bed has now been covered with healthy epidermis. The proximal portion of the nail bed is still exposed with excessive granulation tissue. Please see the photograph which was taken today for documentation. The cellulitis has faded. Past Medical History:  Diagnosis Date  . Cancer ([place]) 07/02/10   prostate  . Hyperlipidemia 11/26/2009  . Hypertension 11/26/2010   Past Surgical History:  Procedure Laterality Date  . CARPAL TUNNEL RELEASE Right 10/22/2006   [facility]  .  PROSTATE SURGERY  09/2010   seeds implant   Current Outpatient Prescriptions on File Prior to Visit  Medication Sig Dispense Refill  . amLODipine (NORVASC) 10 MG tablet Take 1 tablet (10 mg total) by mouth daily. 90 tablet 3  . aspirin 81 MG tablet Take 81 mg by mouth at bedtime.    [name] atorvastatin (LIPITOR) 10 MG tablet Take 1 tablet (10 mg total) by mouth daily. 90 tablet 3  . cephALEXin (KEFLEX) 500 MG capsule Take 1  capsule (500 mg total) by mouth 3 (three) times daily. 21 capsule 0  . Ferrous Sulfate (IRON) 28 MG TABS Take by mouth.    [name] HYDROcodone-acetaminophen (NORCO) 5-325 MG tablet Take 1 tablet by mouth every 6 (six) hours as needed for moderate pain. 10 tablet 0  . losartan (COZAAR) 100 MG tablet Take 1 tablet (100 mg total) by mouth daily. 90 tablet 3  . Multiple Vitamin (MULTIVITAMIN) tablet Take 1 tablet by mouth daily.    . sildenafil (REVATIO) 20 MG tablet Take 1 tablet (20 mg total) by mouth as needed. 10 tablet 11  . silver sulfADIAZINE (SILVADENE) 1 % cream Apply 1 application topically daily. 50 g 0   No current facility-administered medications on file prior to visit.    No Known Allergies Social History   Social History  . Marital status: Divorced    Spouse name: N/A  . Number of children: N/A  . Years of education: N/A   Occupational History  . Not on file.   Social History Main Topics  . Smoking status: Former Research scientist (life sciences)  . Smokeless tobacco: Never Used  . Alcohol use No  . Drug use: No  . Sexual activity: Yes    Birth control/ protection: Condom   Other Topics Concern  . Not on file   Social History Narrative   He drives dump trucks. Hauls rock, dirt, etc.   Divorced. Has one son and one stepdaughter.   :[place]ks [place]  [place] 1 mile every morning Monday through Friday.   He smoked 1/2 pack a day starting at age 75 and quit at age 51.--smoked for about 26 years.    Has Smoked none for 13 years.      Review of Systems  All other systems reviewed and are negative.      Objective:   Physical Exam  Cardiovascular: Normal rate, regular rhythm and normal heart sounds.   Pulmonary/Chest: Effort normal and breath sounds normal. No respiratory distress. He has no wheezes. He has no rales.  Vitals reviewed.  See description in HPI and photo.        Assessment & Plan:  Excessive granulation tissue  Wound is healing now. Secondary infection is improving.  There is still a 4-5 mm focus of excessive granulation tissue at the proximal nail fold adjacent to the nail matrix. Patient would like to continue dressing changes daily for the next 2 weeks to see if this will heal gradually on its own. If not, he needs to return. At that time I would then anesthetized the toe again with a digital block. I will treat the excessive granulation tissue with silver nitrate to facilitate wound healing. She is in agreement and will continue dressing changes for the next week to 2 weeks given the improvement over the last few days in hopes that this will heal gradually on its own

## 2017-10-02 ENCOUNTER — Encounter: Payer: Self-pay | Admitting: Family Medicine

## 2017-10-03 ENCOUNTER — Ambulatory Visit (INDEPENDENT_AMBULATORY_CARE_PROVIDER_SITE_OTHER): Payer: Managed Care, Other (non HMO) | Admitting: Family Medicine

## 2017-10-03 DIAGNOSIS — Z23 Encounter for immunization: Secondary | ICD-10-CM | POA: Diagnosis not present

## 2017-11-04 ENCOUNTER — Ambulatory Visit (INDEPENDENT_AMBULATORY_CARE_PROVIDER_SITE_OTHER): Payer: Managed Care, Other (non HMO) | Admitting: Family Medicine

## 2017-11-04 VITALS — BP 118/80 | HR 86 | Temp 98.4°F | Resp 16 | Ht 70.0 in | Wt 210.0 lb

## 2017-11-04 DIAGNOSIS — E7800 Pure hypercholesterolemia, unspecified: Secondary | ICD-10-CM | POA: Diagnosis not present

## 2017-11-04 DIAGNOSIS — Z0000 Encounter for general adult medical examination without abnormal findings: Secondary | ICD-10-CM

## 2017-11-04 DIAGNOSIS — Z1159 Encounter for screening for other viral diseases: Secondary | ICD-10-CM | POA: Diagnosis not present

## 2017-11-04 DIAGNOSIS — I10 Essential (primary) hypertension: Secondary | ICD-10-CM

## 2017-11-04 NOTE — Progress Notes (Signed)
Subjective:    Patient ID: Nathaniel Smith, male    DOB: Apr 11, 1956, 62 y.o.   MRN: [id number]  HPI Patient is a very pleasant 62 year old African-American male who is here today for a complete physical exam. Medical history is significant for hypertension, hyperlipidemia, a family history of premature coronary artery disease, and prostate cancer. His prostate is monitored by his urologist. Patient's last colonoscopy was in 2016 at the [place] and there were benign polyps.[name]  He denies any chest pain shortness of breath or dyspnea on exertion. 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 However his blood pressure here today is well controlled at 118/80. He is compliant with his medication and there is no need for additional antihypertensive therapy.  Past Medical History:  Diagnosis Date  . Cancer ([place]) 07/02/10   prostate  . Hyperlipidemia 11/26/2009  . Hypertension 11/26/2010   Past Surgical History:  Procedure Laterality Date  . CARPAL TUNNEL RELEASE Right 10/22/2006   [facility]  . PROSTATE SURGERY  09/2010   seeds implant   Current Outpatient Medications on File Prior to Visit  Medication Sig Dispense Refill  . amLODipine (NORVASC) 10 MG tablet Take 1 tablet (10 mg total) by mouth daily. 90 tablet 3  . aspirin 81 MG tablet Take 81 mg by mouth at bedtime.    [name] atorvastatin (LIPITOR) 10 MG tablet Take 1 tablet (10 mg total) by mouth daily. 90 tablet 3  . Ferrous Sulfate (IRON) 28 MG TABS Take by mouth.    . losartan (COZAAR) 100 MG tablet Take 1 tablet (100 mg total) by mouth daily. 90 tablet 3  . Magnesium 500 MG TABS Take by mouth.    . Multiple Vitamin (MULTIVITAMIN) tablet Take 1 tablet by mouth daily.    . sildenafil (REVATIO) 20 MG tablet Take 1 tablet (20 mg total) by mouth as needed. 10 tablet 11   No current facility-administered medications on file prior to visit.    No Known Allergies Social History   Socioeconomic  History  . Marital status: Divorced    Spouse name: Not on file  . Number of children: Not on file  . Years of education: Not on file  . Highest education level: Not on file  Social Needs  . Financial resource strain: Not on file  . Food insecurity - worry: Not on file  . Food insecurity - inability: Not on file  . Transportation needs - medical: Not on file  . Transportation needs - non-medical: Not on file  Occupational History  . Not on file  Tobacco Use  . Smoking status: Former Research scientist (life sciences)  . Smokeless tobacco: Never Used  Substance and Sexual Activity  . Alcohol use: No  . Drug use: No  . Sexual activity: Yes    Birth control/protection: Condom  Other Topics Concern  . Not on file  Social History Narrative   He drives dump trucks. Hauls rock, dirt, etc.   Divorced. Has one son and one stepdaughter.   :Walks on  [place]mill [place] for 1 mile every morning Monday through Friday.   He smoked 1/2 pack a day starting at age 81 and quit at age 63.--smoked for about 26 years.    Has Smoked none for 13 years.   Family History  Problem Relation Age of Onset  . Heart disease Brother 58       Stent at 54--CAD      Review of Systems  All other systems reviewed and are negative.      Objective:   Physical Exam  Constitutional: He is oriented to person, place, and time. He appears well-developed and well-nourished. No distress.  HENT:  Head: Normocephalic and atraumatic.  Right Ear: External ear normal.  Left Ear: External ear normal.  Nose: Nose normal.  Mouth/Throat: Oropharynx is clear and moist. No oropharyngeal exudate.  Eyes: Conjunctivae and EOM are normal. Pupils are equal, round, and reactive to light. Right eye exhibits no discharge. Left eye exhibits no discharge. No scleral icterus.  Neck: Normal range of motion. Neck supple. No JVD present. No tracheal deviation present. No thyromegaly present.  Cardiovascular: Normal rate, regular rhythm, normal heart sounds and  intact distal pulses. Exam reveals no gallop and no friction rub.  No murmur heard. Pulmonary/Chest: Effort normal and breath sounds normal. No stridor. No respiratory distress. He has no wheezes. He has no rales. He exhibits no tenderness.  Abdominal: Soft. Bowel sounds are normal. He exhibits no distension and no mass. There is no tenderness. There is no rebound and no guarding.  Musculoskeletal: Normal range of motion. He exhibits no edema or tenderness.  Lymphadenopathy:    He has no cervical adenopathy.  Neurological: He is alert and oriented to person, place, and time. He has normal reflexes. No cranial nerve deficit. He exhibits normal muscle tone. Coordination normal.  Skin: Skin is warm. No rash noted. He is not diaphoretic. No erythema. No pallor.  Psychiatric: He has a normal mood and affect. His behavior is normal. Judgment and thought content normal.  Vitals reviewed.         Assessment & Plan:  Benign essential HTN - Plan: CBC with Differential/Platelet, COMPLETE METABOLIC PANEL WITH GFR, Lipid panel  Encounter for hepatitis C screening test for low risk patient - Plan: Hepatitis C Antibody  Pure hypercholesterolemia  Routine general medical examination at a health care facility  Physical exam today is completely normal. His blood pressure is excellent.  His colonoscopy is up-to-date. Was performed in April 2016.  His urologist monitors his PSA. He is due for hepatitis C screening. I will check a CBC, CMP, fasting lipid panel. Patient also requests hepatitis C screening. He declines HIV screening. His tetanus vaccine was performed at the [place] in 2016 and is up-to-date. We did discuss the shingles vaccine. We will contact him when the shingles vaccine becomes available

## 2017-11-05 ENCOUNTER — Encounter: Payer: Self-pay | Admitting: Family Medicine

## 2017-11-05 LAB — COMPLETE METABOLIC PANEL WITH GFR
AG RATIO: 1.8 (calc) (ref 1.0–2.5)
ALBUMIN MSPROF: 4.5 g/dL (ref 3.6–5.1)
ALKALINE PHOSPHATASE (APISO): 45 U/L (ref 40–115)
ALT: 28 U/L (ref 9–46)
AST: 23 U/L (ref 10–35)
BUN / CREAT RATIO: 8 (calc) (ref 6–22)
BUN: 11 mg/dL (ref 7–25)
CALCIUM: 9.6 mg/dL (ref 8.6–10.3)
CO2: 23 mmol/L (ref 20–32)
CREATININE: 1.34 mg/dL — AB (ref 0.70–1.25)
Chloride: 103 mmol/L (ref 98–110)
GFR, EST NON AFRICAN AMERICAN: 57 mL/min/{1.73_m2} — AB (ref >=60)
GFR, Est African American: 66 mL/min/{1.73_m2} (ref >=60)
GLOBULIN: 2.5 g/dL (ref 1.9–3.7)
Glucose, Bld: 97 mg/dL (ref 65–99)
POTASSIUM: 4.3 mmol/L (ref 3.5–5.3)
SODIUM: 139 mmol/L (ref 135–146)
Total Bilirubin: 0.7 mg/dL (ref 0.2–1.2)
Total Protein: 7 g/dL (ref 6.1–8.1)

## 2017-11-05 LAB — CBC WITH DIFFERENTIAL/PLATELET
BASOS PCT: 1 %
Basophils Absolute: 48 cells/uL (ref 0–200)
Eosinophils Absolute: 120 cells/uL (ref 15–500)
Eosinophils Relative: 2.5 %
HCT: 46.2 % (ref 38.5–50.0)
Hemoglobin: 15.7 g/dL (ref 13.2–17.1)
Lymphs Abs: 1939 cells/uL (ref 850–3900)
MCH: 27.6 pg (ref 27.0–33.0)
MCHC: 34 g/dL (ref 32.0–36.0)
MCV: 81.2 fL (ref 80.0–100.0)
MONOS PCT: 8.5 %
MPV: 9.9 fL (ref 7.5–12.5)
NEUTROS PCT: 47.6 %
Neutro Abs: 2285 cells/uL (ref 1500–7800)
PLATELETS: 282 10*3/uL (ref 140–400)
RBC: 5.69 10*6/uL (ref 4.20–5.80)
RDW: 13.3 % (ref 11.0–15.0)
TOTAL LYMPHOCYTE: 40.4 %
WBC mixed population: 408 cells/uL (ref 200–950)
WBC: 4.8 10*3/uL (ref 3.8–10.8)

## 2017-11-05 LAB — LIPID PANEL
CHOL/HDL RATIO: 3.6 (calc) (ref <5.0)
Cholesterol: 142 mg/dL (ref <200)
HDL: 39 mg/dL — ABNORMAL LOW (ref >40)
LDL Cholesterol (Calc): 86 mg/dL (calc)
NON-HDL CHOLESTEROL (CALC): 103 mg/dL (ref <130)
Triglycerides: 77 mg/dL (ref <150)

## 2017-11-05 LAB — HEPATITIS C ANTIBODY
HEP C AB: NONREACTIVE
SIGNAL TO CUT-OFF: 0.02 (ref <1.00)

## 2017-12-30 ENCOUNTER — Other Ambulatory Visit: Payer: Self-pay | Admitting: Family Medicine

## 2017-12-30 DIAGNOSIS — E7800 Pure hypercholesterolemia, unspecified: Secondary | ICD-10-CM

## 2018-03-02 ENCOUNTER — Encounter: Payer: Self-pay | Admitting: Family Medicine

## 2018-03-30 ENCOUNTER — Other Ambulatory Visit: Payer: Self-pay | Admitting: Family Medicine

## 2018-04-10 ENCOUNTER — Encounter: Payer: Self-pay | Admitting: Family Medicine

## 2018-04-10 ENCOUNTER — Other Ambulatory Visit: Payer: Self-pay | Admitting: Family Medicine

## 2018-04-10 ENCOUNTER — Ambulatory Visit: Payer: Managed Care, Other (non HMO) | Admitting: Family Medicine

## 2018-04-10 VITALS — BP 120/70 | HR 68 | Temp 98.5°F | Resp 14 | Ht 70.0 in | Wt 202.0 lb

## 2018-04-10 DIAGNOSIS — Z79899 Other long term (current) drug therapy: Secondary | ICD-10-CM

## 2018-04-10 DIAGNOSIS — R944 Abnormal results of kidney function studies: Secondary | ICD-10-CM

## 2018-04-10 DIAGNOSIS — K219 Gastro-esophageal reflux disease without esophagitis: Secondary | ICD-10-CM | POA: Diagnosis not present

## 2018-04-10 DIAGNOSIS — I10 Essential (primary) hypertension: Secondary | ICD-10-CM

## 2018-04-10 DIAGNOSIS — E7800 Pure hypercholesterolemia, unspecified: Secondary | ICD-10-CM

## 2018-04-10 NOTE — Progress Notes (Signed)
Subjective:    Patient ID: Nathaniel Smith, male    DOB: [birth year], 62 y.o.   MRN: [id number]  HPI Patient states that over the last 3 weeks, he has had numerous episodes where he will feel acid coming up into his chest.  He will have wet burps with a salty bitter acidic fluid in his mouth.  When he lays down at night, he can often feel the acid coming up in his chest.  One episode he woke up gasping for breath with the acid in his throat.  He is associated this with whenever he drinks an excessive amount of coffee.  He drinks over 32 ounces of coffee a day.  He denies any melena or hematochezia.  He denies any chest pain or shortness of breath or dyspnea on exertion.  He denies any hematemesis.  He denies any abdominal pain or weight loss. Past Medical History:  Diagnosis Date  . Cancer ([place]) 07/02/10   prostate  . Hyperlipidemia 11/26/2009  . Hypertension 11/26/2010   Past Surgical History:  Procedure Laterality Date  . CARPAL TUNNEL RELEASE Right 10/22/2006   [facility]  . PROSTATE SURGERY  09/2010   seeds implant   Current Outpatient Medications on File Prior to Visit  Medication Sig Dispense Refill  . amLODipine (NORVASC) 10 MG tablet TAKE 1 TABLET BY MOUTH DAILY - DISCONTINUE 5MG  DOSAGE 90 tablet 3  . atorvastatin (LIPITOR) 10 MG tablet TAKE 1 TABLET BY MOUTH DAILY 90 tablet 3  . Ferrous Sulfate (IRON) 28 MG TABS Take by mouth.    . losartan (COZAAR) 100 MG tablet TAKE 1 TABLET BY MOUTH DAILY 90 tablet 3  . Magnesium 500 MG TABS Take by mouth.    . Multiple Vitamin (MULTIVITAMIN) tablet Take 1 tablet by mouth daily.     No current facility-administered medications on file prior to visit.    No Known Allergies Social History   Socioeconomic History  . Marital status: Divorced    Spouse name: Not on file  . Number of children: Not on file  . Years of education: Not on file  . Highest education level: Not on file  Occupational History  . Not on file  Social Needs    . Financial resource strain: Not on file  . Food insecurity:    Worry: Not on file    Inability: Not on file  . Transportation needs:    Medical: Not on file    Non-medical: Not on file  Tobacco Use  . Smoking status: Former Research scientist (life sciences)  . Smokeless tobacco: Never Used  Substance and Sexual Activity  . Alcohol use: No  . Drug use: No  . Sexual activity: Yes    Birth control/protection: Condom  Lifestyle  . Physical activity:    Days per week: Not on file    Minutes per session: Not on file  . Stress: Not on file  Relationships  . Social connections:    Talks on phone: Not on file    Gets together: Not on file    Attends religious service: Not on file    Active member of club or organization: Not on file    Attends meetings of clubs or organizations: Not on file    Relationship status: Not on file  . Intimate partner violence:    Fear of current or ex partner: Not on file    Emotionally abused: Not on file    Physically abused: Not on file    Forced  sexual activity: Not on file  Other Topics Concern  . Not on file  Social History Narrative   He drives dump trucks. Hauls rock, dirt, etc.   Divorced. Has one son and one stepdaughter.   :Walks on  [place]mill [place] for 1 mile every morning Monday through Friday.   He smoked 1/2 pack a day starting at age 63 and quit at age 69.--smoked for about 26 years.    Has Smoked none for 13 years.   Family History  Problem Relation Age of Onset  . Heart disease Brother 74       Stent at 54--CAD      Review of Systems  All other systems reviewed and are negative.      Objective:   Physical Exam  Constitutional: He appears well-developed and well-nourished. No distress.  HENT:  Head: Normocephalic and atraumatic.  Right Ear: External ear normal.  Left Ear: External ear normal.  Nose: Nose normal.  Mouth/Throat: Oropharynx is clear and moist. No oropharyngeal exudate.  Eyes: Pupils are equal, round, and reactive to light.  Conjunctivae and EOM are normal. Right eye exhibits no discharge. Left eye exhibits no discharge. No scleral icterus.  Neck: Normal range of motion. Neck supple. No JVD present. No tracheal deviation present. No thyromegaly present.  Cardiovascular: Normal rate, regular rhythm, normal heart sounds and intact distal pulses. Exam reveals no gallop and no friction rub.  No murmur heard. Pulmonary/Chest: Effort normal and breath sounds normal. No stridor. No respiratory distress. He has no wheezes. He has no rales. He exhibits no tenderness.  Abdominal: Soft. Bowel sounds are normal. He exhibits no distension and no mass. There is no tenderness. There is no rebound and no guarding.  Lymphadenopathy:    He has no cervical adenopathy.  Skin: He is not diaphoretic.  Vitals reviewed.         Assessment & Plan:  Gastroesophageal reflux disease, esophagitis presence not specified  Begin Dexilant 60 mg p.o. every morning with breakfast.  Elevate the head of the bed 2 inches.  Decrease consumption of acidic foods.  Discontinue coffee or reduce consumption and switch to decaf.  Reassess in 2 weeks to see if symptoms are continuing.  Reassess sooner if symptoms worsen

## 2018-04-20 ENCOUNTER — Encounter: Payer: Self-pay | Admitting: Family Medicine

## 2018-04-21 MED ORDER — DEXLANSOPRAZOLE 60 MG PO CPDR: 60.0000 mg | DELAYED_RELEASE_CAPSULE | Freq: Every day | ORAL | 0 refills | Status: DC

## 2018-04-21 MED ORDER — DEXLANSOPRAZOLE 60 MG PO CPDR: 60.0000 mg | DELAYED_RELEASE_CAPSULE | Freq: Every day | ORAL | 3 refills | Status: DC

## 2018-04-23 ENCOUNTER — Telehealth: Payer: Self-pay | Admitting: *Deleted

## 2018-04-23 ENCOUNTER — Encounter: Payer: Self-pay | Admitting: *Deleted

## 2018-04-23 NOTE — Telephone Encounter (Signed)
Your information has been submitted to [employer] and is being reviewed. You may close this dialog, return to your dashboard, and perform other tasks. You will receive an electronic determination in CoverMyMeds within 72-120 hours; you'll also receive a faxed copy. You can see the latest determination by locating this request on your dashboard or reopening this request. If [employer] has not responded in 120 hours, contact [employer] at [phone]. 

## 2018-04-23 NOTE — Telephone Encounter (Signed)
Received request from pharmacy for PA on Dexilant.  PA submitted.   Dx: [id number] GERD. 

## 2018-04-25 ENCOUNTER — Other Ambulatory Visit: Payer: Managed Care, Other (non HMO)

## 2018-04-25 DIAGNOSIS — R944 Abnormal results of kidney function studies: Secondary | ICD-10-CM

## 2018-04-25 DIAGNOSIS — E7800 Pure hypercholesterolemia, unspecified: Secondary | ICD-10-CM

## 2018-04-25 DIAGNOSIS — I10 Essential (primary) hypertension: Secondary | ICD-10-CM

## 2018-04-25 DIAGNOSIS — Z79899 Other long term (current) drug therapy: Secondary | ICD-10-CM

## 2018-04-25 LAB — LIPID PANEL
CHOL/HDL RATIO: 4.1 (calc) (ref <5.0)
Cholesterol: 151 mg/dL (ref <200)
HDL: 37 mg/dL — ABNORMAL LOW (ref >40)
LDL CHOLESTEROL (CALC): 95 mg/dL
Non-HDL Cholesterol (Calc): 114 mg/dL (calc) (ref <130)
Triglycerides: 94 mg/dL (ref <150)

## 2018-04-25 LAB — COMPREHENSIVE METABOLIC PANEL
AG Ratio: 2 (calc) (ref 1.0–2.5)
ALBUMIN MSPROF: 4.3 g/dL (ref 3.6–5.1)
ALT: 19 U/L (ref 9–46)
AST: 19 U/L (ref 10–35)
Alkaline phosphatase (APISO): 53 U/L (ref 40–115)
BUN: 9 mg/dL (ref 7–25)
CO2: 24 mmol/L (ref 20–32)
Calcium: 9.4 mg/dL (ref 8.6–10.3)
Chloride: 105 mmol/L (ref 98–110)
Creat: 1.17 mg/dL (ref 0.70–1.25)
GLUCOSE: 91 mg/dL (ref 65–99)
Globulin: 2.1 g/dL (calc) (ref 1.9–3.7)
POTASSIUM: 4.3 mmol/L (ref 3.5–5.3)
SODIUM: 138 mmol/L (ref 135–146)
TOTAL PROTEIN: 6.4 g/dL (ref 6.1–8.1)
Total Bilirubin: 0.4 mg/dL (ref 0.2–1.2)

## 2018-04-25 LAB — CBC WITH DIFFERENTIAL/PLATELET
Basophils Absolute: 49 cells/uL (ref 0–200)
Basophils Relative: 0.8 %
EOS PCT: 1.5 %
Eosinophils Absolute: 92 cells/uL (ref 15–500)
HCT: 45.2 % (ref 38.5–50.0)
Hemoglobin: 14.9 g/dL (ref 13.2–17.1)
LYMPHS ABS: 2306 {cells}/uL (ref 850–3900)
MCH: 27.3 pg (ref 27.0–33.0)
MCHC: 33 g/dL (ref 32.0–36.0)
MCV: 82.9 fL (ref 80.0–100.0)
MPV: 9.9 fL (ref 7.5–12.5)
Monocytes Relative: 7.7 %
NEUTROS PCT: 52.2 %
Neutro Abs: 3184 cells/uL (ref 1500–7800)
Platelets: 276 10*3/uL (ref 140–400)
RBC: 5.45 10*6/uL (ref 4.20–5.80)
RDW: 13.6 % (ref 11.0–15.0)
TOTAL LYMPHOCYTE: 37.8 %
WBC mixed population: 470 cells/uL (ref 200–950)
WBC: 6.1 10*3/uL (ref 3.8–10.8)

## 2018-04-28 ENCOUNTER — Other Ambulatory Visit: Payer: Self-pay | Admitting: Family Medicine

## 2018-04-28 ENCOUNTER — Ambulatory Visit: Payer: Managed Care, Other (non HMO) | Admitting: Family Medicine

## 2018-04-28 ENCOUNTER — Encounter: Payer: Self-pay | Admitting: Family Medicine

## 2018-04-28 VITALS — BP 110/66 | HR 94 | Temp 98.1°F | Resp 16 | Ht 70.0 in | Wt 202.0 lb

## 2018-04-28 DIAGNOSIS — K219 Gastro-esophageal reflux disease without esophagitis: Secondary | ICD-10-CM

## 2018-04-28 MED ORDER — RANITIDINE HCL 300 MG PO TABS: 300.0000 mg | ORAL_TABLET | Freq: Every day | ORAL | 3 refills | Status: DC

## 2018-04-28 MED ORDER — PANTOPRAZOLE SODIUM 40 MG PO TBEC: 40.0000 mg | DELAYED_RELEASE_TABLET | Freq: Every day | ORAL | 3 refills | Status: DC

## 2018-04-28 NOTE — Telephone Encounter (Signed)
Received f/u paperwork and pt has not been on and failed a generic version of PPI - med changed to protonix and pt has apt this afternoon to discuss.

## 2018-04-28 NOTE — Progress Notes (Signed)
Subjective:    Patient ID: Nathaniel Smith, male    DOB: 07-14-1956, 62 y.o.   MRN: [id number]  Gastroesophageal Reflux   04/10/18 Patient states that over the last 3 weeks, he has had numerous episodes where he will feel acid coming up into his chest.  He will have wet burps with a salty bitter acidic fluid in his mouth.  When he lays down at night, he can often feel the acid coming up in his chest.  One episode he woke up gasping for breath with the acid in his throat.  He is associated this with whenever he drinks an excessive amount of coffee.  He drinks over 32 ounces of coffee a day.  He denies any melena or hematochezia.  He denies any chest pain or shortness of breath or dyspnea on exertion.  He denies any hematemesis.  He denies any abdominal pain or weight loss.  At that time, my plan was: Begin Dexilant 60 mg p.o. every morning with breakfast.  Elevate the head of the bed 2 inches.  Decrease consumption of acidic foods.  Discontinue coffee or reduce consumption and switch to decaf.  Reassess in 2 weeks to see if symptoms are continuing.  Reassess sooner if symptoms worsen  04/28/18 Patient states that the dexilant has helped his symptoms somewhat.  He is no longer being awakened from sleep with reflux.  However he continues to have spells almost every other day where he will develop a severe burning sensation in the center of his chest usually immediately after eating there will be moderate to severe in intensity.  If he uses Tums, the symptoms will gradually subside over approximately 1 hour.  He denies any melena or hematochezia.  He denies any weight loss nausea or vomiting.  He denies any angina or shortness of breath Past Medical History:  Diagnosis Date  . Cancer ([place]) 07/02/10   prostate  . Hyperlipidemia 11/26/2009  . Hypertension 11/26/2010   Past Surgical History:  Procedure Laterality Date  . CARPAL TUNNEL RELEASE Right 10/22/2006   [facility]  . PROSTATE SURGERY   09/2010   seeds implant   Current Outpatient Medications on File Prior to Visit  Medication Sig Dispense Refill  . amLODipine (NORVASC) 10 MG tablet TAKE 1 TABLET BY MOUTH DAILY - DISCONTINUE 5MG  DOSAGE 90 tablet 3  . atorvastatin (LIPITOR) 10 MG tablet TAKE 1 TABLET BY MOUTH DAILY 90 tablet 3  . dexlansoprazole (DEXILANT) 60 MG capsule Take 1 capsule (60 mg total) by mouth daily. 90 capsule 3  . Ferrous Sulfate (IRON) 28 MG TABS Take by mouth.    . losartan (COZAAR) 100 MG tablet TAKE 1 TABLET BY MOUTH DAILY 90 tablet 3  . Magnesium 500 MG TABS Take by mouth.    . Multiple Vitamin (MULTIVITAMIN) tablet Take 1 tablet by mouth daily.     No current facility-administered medications on file prior to visit.    No Known Allergies Social History   Socioeconomic History  . Marital status: Divorced    Spouse name: Not on file  . Number of children: Not on file  . Years of education: Not on file  . Highest education level: Not on file  Occupational History  . Not on file  Social Needs  . Financial resource strain: Not on file  . Food insecurity:    Worry: Not on file    Inability: Not on file  . Transportation needs:    Medical: Not on file  Non-medical: Not on file  Tobacco Use  . Smoking status: Former Research scientist (life sciences)  . Smokeless tobacco: Never Used  Substance and Sexual Activity  . Alcohol use: No  . Drug use: No  . Sexual activity: Yes    Birth control/protection: Condom  Lifestyle  . Physical activity:    Days per week: Not on file    Minutes per session: Not on file  . Stress: Not on file  Relationships  . Social connections:    Talks on phone: Not on file    Gets together: Not on file    Attends religious service: Not on file    Active member of club or organization: Not on file    Attends meetings of clubs or organizations: Not on file    Relationship status: Not on file  . Intimate partner violence:    Fear of current or ex partner: Not on file    Emotionally  abused: Not on file    Physically abused: Not on file    Forced sexual activity: Not on file  Other Topics Concern  . Not on file  Social History Narrative   He drives dump trucks. Hauls rock, dirt, etc.   Divorced. Has one son and one stepdaughter.   :Walks on  Treadmill mill for 1 mile every morning Monday through Friday.   He smoked 1/2 pack a day starting at age 62 and quit at age 29.--smoked for about 26 years.    Has Smoked none for 13 years.   Family History  Problem Relation Age of Onset  . Heart disease Brother 2       Stent at 54--CAD      Review of Systems  All other systems reviewed and are negative.      Objective:   Physical Exam  Constitutional: He appears well-developed and well-nourished. No distress.  HENT:  Head: Normocephalic and atraumatic.  Right Ear: External ear normal.  Left Ear: External ear normal.  Nose: Nose normal.  Mouth/Throat: Oropharynx is clear and moist. No oropharyngeal exudate.  Eyes: Pupils are equal, round, and reactive to light. Conjunctivae and EOM are normal. Right eye exhibits no discharge. Left eye exhibits no discharge. No scleral icterus.  Neck: Normal range of motion. Neck supple. No JVD present. No tracheal deviation present. No thyromegaly present.  Cardiovascular: Normal rate, regular rhythm, normal heart sounds and intact distal pulses. Exam reveals no gallop and no friction rub.  No murmur heard. Pulmonary/Chest: Effort normal and breath sounds normal. No stridor. No respiratory distress. He has no wheezes. He has no rales. He exhibits no tenderness.  Abdominal: Soft. Bowel sounds are normal. He exhibits no distension and no mass. There is no tenderness. There is no rebound and no guarding.  Lymphadenopathy:    He has no cervical adenopathy.  Skin: He is not diaphoretic.  Vitals reviewed.         Assessment & Plan:  Gastroesophageal reflux disease, esophagitis presence not specified Patient symptoms have  improved slightly however they continue to persist.  Insurance will not cover Ruth.  Therefore we will replace this with Protonix 40 mg a day in the morning with food and supplement with Zantac 300 mg p.o. nightly.  Allow 2 to 3 weeks to take full effect and if no improvement is seen, I will consult GI for possible EGD to rule out other causes of refractory reflux

## 2018-04-30 ENCOUNTER — Telehealth: Payer: Self-pay | Admitting: Family Medicine

## 2018-04-30 NOTE — Telephone Encounter (Signed)
PA Submitted through [email or web address] and received the following:  Your information has been submitted and will be reviewed by [employer]. You may close this dialog, return to your dashboard, and perform other tasks. An electronic determination will be received in CoverMyMeds within 72-120 hours. You can see the latest determination by locating this request on your dashboard or by reopening this request. You will receive a fax copy of the determination. If [employer] has not responded in 120 hours, contact [employer] at [phone]. 

## 2018-05-01 MED ORDER — OMEPRAZOLE 40 MG PO CPDR: 40.0000 mg | DELAYED_RELEASE_CAPSULE | Freq: Every day | ORAL | 3 refills | Status: DC

## 2018-05-01 NOTE — Telephone Encounter (Signed)
Medication called/sent to requested pharmacy  

## 2018-05-01 NOTE — Telephone Encounter (Signed)
Insurance denied Protonix - Other recommendation?

## 2018-05-01 NOTE — Telephone Encounter (Signed)
Omeprazole 40 qday

## 2018-05-02 ENCOUNTER — Encounter: Payer: Self-pay | Admitting: Gastroenterology

## 2018-05-05 ENCOUNTER — Encounter: Payer: Self-pay | Admitting: Gastroenterology

## 2018-05-05 ENCOUNTER — Other Ambulatory Visit: Payer: Self-pay | Admitting: Family Medicine

## 2018-05-05 MED ORDER — OMEPRAZOLE 40 MG PO CPDR: 40.0000 mg | DELAYED_RELEASE_CAPSULE | Freq: Every day | ORAL | 3 refills | Status: DC

## 2018-06-30 ENCOUNTER — Ambulatory Visit: Payer: Managed Care, Other (non HMO) | Admitting: Gastroenterology

## 2018-06-30 ENCOUNTER — Encounter: Payer: Self-pay | Admitting: Gastroenterology

## 2018-06-30 VITALS — BP 120/80 | HR 76 | Ht 70.0 in | Wt 201.8 lb

## 2018-06-30 DIAGNOSIS — R12 Heartburn: Secondary | ICD-10-CM

## 2018-06-30 DIAGNOSIS — R0789 Other chest pain: Secondary | ICD-10-CM | POA: Diagnosis not present

## 2018-06-30 DIAGNOSIS — K649 Unspecified hemorrhoids: Secondary | ICD-10-CM

## 2018-06-30 DIAGNOSIS — K219 Gastro-esophageal reflux disease without esophagitis: Secondary | ICD-10-CM

## 2018-06-30 DIAGNOSIS — Z9889 Other specified postprocedural states: Secondary | ICD-10-CM

## 2018-06-30 MED ORDER — OMEPRAZOLE 40 MG PO CPDR: 40.0000 mg | DELAYED_RELEASE_CAPSULE | Freq: Two times a day (BID) | ORAL | 3 refills | Status: DC

## 2018-06-30 MED ORDER — OMEPRAZOLE 40 MG PO CPDR: 40.0000 mg | DELAYED_RELEASE_CAPSULE | Freq: Every day | ORAL | 3 refills | Status: DC

## 2018-06-30 NOTE — Patient Instructions (Signed)
Normal BMI (Body Mass Index- based on height and weight) is between 19 and 25. Your BMI today is Body mass index is 28.96 kg/m. [name] Please consider follow up  regarding your BMI with your Primary Care Provider.  We have sent the following medications to your pharmacy for you to pick up at your convenience:  We have scheduled you for a follow up appointment with Dr Rush Landmark on 08/08/18 at 2pm  Thank you for entrusting me with your care and choosing [facility].  Dr Rush Landmark

## 2018-07-01 ENCOUNTER — Encounter: Payer: Self-pay | Admitting: Gastroenterology

## 2018-07-01 DIAGNOSIS — R12 Heartburn: Secondary | ICD-10-CM | POA: Insufficient documentation

## 2018-07-01 DIAGNOSIS — R0789 Other chest pain: Secondary | ICD-10-CM | POA: Insufficient documentation

## 2018-07-01 DIAGNOSIS — K649 Unspecified hemorrhoids: Secondary | ICD-10-CM | POA: Insufficient documentation

## 2018-07-01 DIAGNOSIS — K219 Gastro-esophageal reflux disease without esophagitis: Secondary | ICD-10-CM | POA: Insufficient documentation

## 2018-07-01 DIAGNOSIS — Z9889 Other specified postprocedural states: Secondary | ICD-10-CM | POA: Insufficient documentation

## 2018-07-01 NOTE — Progress Notes (Signed)
[facility] VISIT   Primary Care Provider Mukesh, Kornegay, MD [address] [place] [address] [phone]  Referring Provider Susy Frizzle, MD [address] [place], [place] [address] [phone]  Patient Profile: Nathaniel Smith is a 62 y.o. male with a pmh significant for Prostate Cancer, HLD, HTN, Pyrosis/GERD, hemorrhoids.  The patient presents to the [facility] for an evaluation and management of problem(s) noted below:  Problem List 1. Gastroesophageal reflux disease, esophagitis presence not specified   2. Pyrosis   3. Atypical chest pain   4. History of colonoscopy   5. Hemorrhoids, unspecified hemorrhoid type     History of Present Illness: This is the patient's first visit to the [facility].  He describes issues of a sensation of pyrosis that began in April 2019 and has subsequently had multiple intermittent episodes over the course of the ensuing months.  In the last 3 months or so he has had progressive issues with this sensation/feeling.  He describes at times a discomfort in his right chest that is nonradiating to his neck or to his arms.  He describes pyrosis symptoms occurring mostly at nighttime after he is falling asleep.  He has found that late night eating/snacking including eating chocolates or late night snacks (Hershey[employer] kisses) as well as coffee can sometimes cause him to have issues.  His pyrosis is a burning sensation in the midportion of the chest/sternum with radiation of that into his throat with a sensation of acid discomfort and taste.  In the past he had tried Tums.  He had seen his primary care physician about this and describes being prescribed Dexilant which he took for only a few weeks because he did not feel that it worked.  He saw his PCP and follow-up and was given omeprazole and is taking that once daily around 30 to 60 minutes before he eats breakfast.  He also has  over-the-counter Zantac which she has not used but is at home (not clear if this is 75 or 150).  He is tried to decrease his intake of chocolate as well as caffeinated beverages to try and minimize issues.  He describes a few episodes of solid food dysphagia over the course the last few months however this has only occurred less than a handful of times.  He has not had to regurgitate any of his food.  His bowel movement pattern is normally daily or every other day and has been like this for many years.  He has had a previous colonoscopy done at the [facility] within the last 5 to 6 years but cannot recall the timing for follow-up.  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  He does not take significant nonsteroidals or BC/Goody powders.  He has never had an upper endoscopy.  He has discussed this with his primary care physician in the past whom at this point does not feel cardiac etiologies are playing a role in his atypical chest discomfort.  GI Review of Systems Positive as above Negative for odynophagia, abdominal pain, nausea, vomiting, bloating, change in bowel habits, melena, hematochezia  Review of Systems General: Denies fevers/chills/weight loss HEENT: Denies oral lesions Cardiovascular: Denies palpitations Pulmonary: Denies shortness of breath/cough Gastroenterological: See HPI Genitourinary: Denies hematuria or darkened urine Hematological: Denies easy bruising Dermatological: Denies jaundice Psychological: Mood is stable Musculoskeletal: Denies new arthralgias   Medications Current Outpatient  Medications  Medication Sig Dispense Refill  . amLODipine (NORVASC) 10 MG tablet TAKE 1 TABLET BY MOUTH DAILY - DISCONTINUE 5MG  DOSAGE 90 tablet 3  . atorvastatin (LIPITOR) 10 MG tablet TAKE 1 TABLET BY MOUTH DAILY 90 tablet 3  . Ferrous Sulfate (IRON) 28 MG TABS Take by mouth.    . losartan (COZAAR) 100 MG tablet TAKE 1 TABLET BY MOUTH DAILY 90  tablet 3  . Magnesium 500 MG TABS Take by mouth.    . Multiple Vitamin (MULTIVITAMIN) tablet Take 1 tablet by mouth daily.    [name] omeprazole (PRILOSEC) 40 MG capsule Take 1 capsule (40 mg total) by mouth 2 (two) times daily at 8 am and 10 pm. 60 capsule 3   No current facility-administered medications for this visit.     Allergies No Known Allergies  Histories Past Medical History:  Diagnosis Date  . Cancer ([place]) 07/02/10   prostate  . Hyperlipidemia 11/26/2009  . Hypertension 11/26/2010  . Pyrosis    Past Surgical History:  Procedure Laterality Date  . CARPAL TUNNEL RELEASE Right 10/22/2006    [facility]  . PROSTATE SURGERY  09/2010   seeds implant   Social History   Socioeconomic History  . Marital status: Divorced    Spouse name: Not on file  . Number of children: 1  . Years of education: Not on file  . Highest education level: Not on file  Occupational History  . Not on file  Social Needs  . Financial resource strain: Not on file  . Food insecurity:    Worry: Not on file    Inability: Not on file  . Transportation needs:    Medical: Not on file    Non-medical: Not on file  Tobacco Use  . Smoking status: Former Research scientist (life sciences)  . Smokeless tobacco: Never Used  Substance and Sexual Activity  . Alcohol use: No  . Drug use: No  . Sexual activity: Yes    Birth control/protection: Condom  Lifestyle  . Physical activity:    Days per week: Not on file    Minutes per session: Not on file  . Stress: Not on file  Relationships  . Social connections:    Talks on phone: Not on file    Gets together: Not on file    Attends religious service: Not on file    Active member of club or organization: Not on file    Attends meetings of clubs or organizations: Not on file    Relationship status: Not on file  . Intimate partner violence:    Fear of current or ex partner: Not on file    Emotionally abused: Not on file    Physically abused: Not on file    Forced sexual  activity: Not on file  Other Topics Concern  . Not on file  Social History Narrative   He drives dump trucks. Hauls rock, dirt, etc.   Divorced. Has one son and one stepdaughter.   :Walks on  Treadmill mill for 1 mile every morning Monday through Friday.   He smoked 1/2 pack a day starting at age 13 and quit at age 34.--smoked for about 26 years.    Has Smoked none for 13 years.   Family History  Problem Relation Age of Onset  . Diabetes Father   . Heart disease Father   . Heart disease Brother 54       Stent at 54--CAD  . Colon cancer Maternal Uncle   . Heart disease  Paternal Aunt   . Prostate cancer Paternal Uncle   . Heart disease Paternal Uncle   . Esophageal cancer Neg Hx   . Inflammatory bowel disease Neg Hx   . Liver disease Neg Hx   . Pancreatic cancer Neg Hx   . Rectal cancer Neg Hx   . Stomach cancer Neg Hx    I have reviewed his medical, social, and family history in detail and updated the electronic medical record as necessary.    PHYSICAL EXAMINATION  BP 120/80   Pulse 76   Ht 5\' 10"  (1.778 m)   Wt 201 lb 12.8 oz (91.5 kg)   BMI 28.96 kg/m  Wt Readings from Last 3 Encounters:  06/30/18 201 lb 12.8 oz (91.5 kg)  04/28/18 202 lb (91.6 kg)  04/10/18 202 lb (91.6 kg)  GEN: NAD, appears stated age, doesn't appear chronically ill PSYCH: Cooperative, without pressured speech EYE: Conjunctivae pink, sclerae anicteric ENT: MMM, without oral ulcers, no erythema or exudates noted NECK: Supple CV: RR without R/Gs  RESP: CTAB posteriorly, without wheezing GI: NABS, soft, NT/ND, without rebound or guarding, no HSM appreciated MSK/EXT: No lower extremity edema SKIN: No jaundice NEURO:  Alert & Oriented x 3, no focal deficits   REVIEW OF DATA  I reviewed the following data at the time of this encounter:  GI Procedures and Studies  Reported prior colonoscopy at the [facility] will need to be obtained for review  Laboratory Studies  Reviewed in EPIC with no  evidence of anemia  Imaging Studies  No relevant studies to review   ASSESSMENT  Mr. Kuck is a 62 y.o. male with a pmh significant for Prostate Cancer, HLD, HTN, Pyrosis/GERD, hemorrhoids.   The patient is seen today for evaluation and management of:  1. Gastroesophageal reflux disease, esophagitis presence not specified   2. Pyrosis   3. Atypical chest pain   4. History of colonoscopy   5. Hemorrhoids, unspecified hemorrhoid type    The patient's atypical chest pain presentation is concerning for the possibility of underlying GERD.  With that being said it is interesting that he has been taking omeprazole as well as Dexilant within the last few months and is not clear if he has had significant improvement in his symptoms.  He has had very rare occasions of solid food dysphagia and has not had any unintentional weight loss.  That being said I do think that there are some lifestyle modifications which the patient should follow including trying not to lay down for at least 2 to 3 hours after meals and trying to minimize late night snacks including snacks that he knows can cause his symptoms to be worse.  His wife has a bed wedge and I have asked that the patient start using a wedge as well at night before he goes to sleep.  It is possible that the patient's symptoms could be nonacid related reflux and/or hypersensitive esophagus.  With that being said we will begin a therapeutic/diagnostic treatment with high-dose PPI therapy over the course the next 6 to 8 weeks at which point he continues to have symptoms we will then consider the role of an upper endoscopy.  If an upper endoscopy is performed, and there is no evidence of significant acid related changes then we may need to consider pH impedance study as well as have his primary care physician ensure that patient undergoes either stress testing to ensure that there is no other cardiac etiologies for his discomfort.  He has had issues with obtaining  Dexilant as well as omeprazole with his insurance and so obtained omeprazole at [facility].  We will plan to increase to twice daily dosing of his omeprazole and send that to [facility].  Patient understands if there are issues with cost prohibitive nests to obtaining medications that he should let us know what other medications can be considered.  I would not use Zantac as his primary acid reducing medicine at this point in time while we try and further clarify his underlying diagnosis.  All questions were answered to the best of my ability and the patient agrees to this plan of action with follow-up as indicated.   PLAN  1. Gastroesophageal reflux disease, esophagitis presence not specified -Increase omeprazole to twice daily 40 mg (prescription sent to pharmacy) -Lifestyle modifications discussed with patient as well as a food diary that will monitor food symptoms - If after 6 to 8 weeks patient continues to have issues then plan will be for evaluation via upper endoscopy to obtain an endoscopic evaluation as well as esophageal biopsies to rule out EOE  2. Pyrosis -As per above  3. Atypical chest pain -Work-up GERD however if this is negative would ask the PCP continue to consider role of cardiac stress testing  4. History of colonoscopy -Patient to give consent for obtaining results from [place]  5. Hemorrhoids, unspecified hemorrhoid type -No hemorrhoidal bleeding per report at this time  No orders of the defined types were placed in this encounter.   New Prescriptions   OMEPRAZOLE (PRILOSEC) 40 MG CAPSULE    Take 1 capsule (40 mg total) by mouth 2 (two) times daily at 8 am and 10 pm.   Modified Medications   No medications on file    Planned Follow Up: No follow-ups on file.   Justice Britain, MD [facility] Office # [phone]

## 2018-07-15 ENCOUNTER — Encounter: Payer: Self-pay | Admitting: Family Medicine

## 2018-07-17 ENCOUNTER — Ambulatory Visit: Payer: Managed Care, Other (non HMO) | Admitting: Family Medicine

## 2018-07-17 ENCOUNTER — Encounter: Payer: Self-pay | Admitting: Family Medicine

## 2018-07-17 VITALS — BP 130/80 | HR 78 | Temp 98.1°F | Resp 16 | Ht 70.0 in | Wt 202.0 lb

## 2018-07-17 DIAGNOSIS — J019 Acute sinusitis, unspecified: Secondary | ICD-10-CM | POA: Diagnosis not present

## 2018-07-17 DIAGNOSIS — B9689 Other specified bacterial agents as the cause of diseases classified elsewhere: Secondary | ICD-10-CM

## 2018-07-17 MED ORDER — AMOXICILLIN 875 MG PO TABS: 875.0000 mg | ORAL_TABLET | Freq: Two times a day (BID) | ORAL | 0 refills | Status: DC

## 2018-07-17 NOTE — Progress Notes (Signed)
Subjective:    Patient ID: Nathaniel Smith, male    DOB: 1956-03-06, 62 y.o.   MRN: [id number]  HPI Patient is a very pleasant 62 year old African-American male here today for an acute illness.  14 days ago, he developed an upper respiratory infection.  Symptoms included rhinorrhea, head congestion, postnasal drip, sore scratchy throat, and nonproductive cough.  He treated the symptoms symptomatically with Mucinex, Sudafed and symptoms gradually improved however over the last 3 to 4 days the symptoms have worsened.  He now has pain and pressure behind his eyes and in his cheekbones.  He has increased rhinorrhea, purulent nasal drainage when he blows his nose, cough productive of green sputum, and worsening head congestion. Past Medical History:  Diagnosis Date  . Cancer ([place]) 07/02/10   prostate  . Hyperlipidemia 11/26/2009  . Hypertension 11/26/2010  . Pyrosis    Past Surgical History:  Procedure Laterality Date  . CARPAL TUNNEL RELEASE Right 10/22/2006   [facility]  . PROSTATE SURGERY  09/2010   seeds implant   Current Outpatient Medications on File Prior to Visit  Medication Sig Dispense Refill  . amLODipine (NORVASC) 10 MG tablet TAKE 1 TABLET BY MOUTH DAILY - DISCONTINUE 5MG  DOSAGE 90 tablet 3  . atorvastatin (LIPITOR) 10 MG tablet TAKE 1 TABLET BY MOUTH DAILY 90 tablet 3  . Ferrous Sulfate (IRON) 28 MG TABS Take by mouth.    . losartan (COZAAR) 100 MG tablet TAKE 1 TABLET BY MOUTH DAILY 90 tablet 3  . Magnesium 500 MG TABS Take by mouth.    . Multiple Vitamin (MULTIVITAMIN) tablet Take 1 tablet by mouth daily.    [name] omeprazole (PRILOSEC) 40 MG capsule Take 1 capsule (40 mg total) by mouth 2 (two) times daily at 8 am and 10 pm. 60 capsule 3   No current facility-administered medications on file prior to visit.    No Known Allergies Social History   Socioeconomic History  . Marital status: Divorced    Spouse name: Not on file  . Number of children: 1  . Years of  education: Not on file  . Highest education level: Not on file  Occupational History  . Not on file  Social Needs  . Financial resource strain: Not on file  . Food insecurity:    Worry: Not on file    Inability: Not on file  . Transportation needs:    Medical: Not on file    Non-medical: Not on file  Tobacco Use  . Smoking status: Former Research scientist (life sciences)  . Smokeless tobacco: Never Used  Substance and Sexual Activity  . Alcohol use: No  . Drug use: No  . Sexual activity: Yes    Birth control/protection: Condom  Lifestyle  . Physical activity:    Days per week: Not on file    Minutes per session: Not on file  . Stress: Not on file  Relationships  . Social connections:    Talks on phone: Not on file    Gets together: Not on file    Attends religious service: Not on file    Active member of club or organization: Not on file    Attends meetings of clubs or organizations: Not on file    Relationship status: Not on file  . Intimate partner violence:    Fear of current or ex partner: Not on file    Emotionally abused: Not on file    Physically abused: Not on file    Forced sexual activity:  Not on file  Other Topics Concern  . Not on file  Social History Narrative   He drives dump trucks. Hauls rock, dirt, etc.   Divorced. Has one son and one stepdaughter.   :Walks on  Treadmill mill for 1 mile every morning Monday through Friday.   He smoked 1/2 pack a day starting at age 34 and quit at age 47.--smoked for about 26 years.    Has Smoked none for 13 years.   Family History  Problem Relation Age of Onset  . Diabetes Father   . Heart disease Father   . Heart disease Brother 54       Stent at 54--CAD  . Colon cancer Maternal Uncle   . Heart disease Paternal Aunt   . Prostate cancer Paternal Uncle   . Heart disease Paternal Uncle   . Esophageal cancer Neg Hx   . Inflammatory bowel disease Neg Hx   . Liver disease Neg Hx   . Pancreatic cancer Neg Hx   . Rectal cancer Neg Hx   .  Stomach cancer Neg Hx       Review of Systems  All other systems reviewed and are negative.      Objective:   Physical Exam  Constitutional: He is oriented to person, place, and time. He appears well-developed and well-nourished. No distress.  HENT:  Head: Normocephalic and atraumatic.  Right Ear: External ear normal.  Left Ear: External ear normal.  Nose: Mucosal edema and rhinorrhea present. Right sinus exhibits maxillary sinus tenderness and frontal sinus tenderness. Left sinus exhibits maxillary sinus tenderness and frontal sinus tenderness.  Mouth/Throat: Oropharynx is clear and moist. No oropharyngeal exudate.  Eyes: Pupils are equal, round, and reactive to light. Conjunctivae and EOM are normal. Right eye exhibits no discharge. Left eye exhibits no discharge. No scleral icterus.  Neck: Normal range of motion. Neck supple. No JVD present. No tracheal deviation present. No thyromegaly present.  Cardiovascular: Normal rate, regular rhythm, normal heart sounds and intact distal pulses. Exam reveals no gallop and no friction rub.  No murmur heard. Pulmonary/Chest: Effort normal and breath sounds normal. No stridor. No respiratory distress. He has no wheezes. He has no rales. He exhibits no tenderness.  Abdominal: Soft. Bowel sounds are normal. He exhibits no distension and no mass. There is no tenderness. There is no rebound and no guarding.  Musculoskeletal: Normal range of motion. He exhibits no edema or tenderness.  Lymphadenopathy:    He has no cervical adenopathy.  Neurological: He is alert and oriented to person, place, and time. He has normal reflexes. No cranial nerve deficit. He exhibits normal muscle tone. Coordination normal.  Skin: Skin is warm. No rash noted. He is not diaphoretic. No erythema. No pallor.  Psychiatric: He has a normal mood and affect. His behavior is normal. Judgment and thought content normal.  Vitals reviewed.         Assessment & Plan:  Acute  bacterial rhinosinusitis  I believe the patient has developed a secondary sinus infection.  Begin amoxicillin 875 mg p.o. twice daily for 10 days.  He can continue to use Mucinex and Sudafed as needed for symptoms.  Recheck in 1 week if no better or sooner if worse

## 2018-07-22 ENCOUNTER — Ambulatory Visit: Payer: Managed Care, Other (non HMO) | Admitting: Family Medicine

## 2018-07-31 ENCOUNTER — Ambulatory Visit: Payer: Self-pay | Admitting: Gastroenterology

## 2018-08-08 ENCOUNTER — Ambulatory Visit: Payer: Managed Care, Other (non HMO) | Admitting: Gastroenterology

## 2018-08-08 ENCOUNTER — Encounter: Payer: Self-pay | Admitting: Gastroenterology

## 2018-08-08 VITALS — BP 144/78 | HR 67 | Ht 70.0 in | Wt 200.0 lb

## 2018-08-08 DIAGNOSIS — R0789 Other chest pain: Secondary | ICD-10-CM | POA: Diagnosis not present

## 2018-08-08 DIAGNOSIS — R12 Heartburn: Secondary | ICD-10-CM | POA: Diagnosis not present

## 2018-08-08 DIAGNOSIS — K219 Gastro-esophageal reflux disease without esophagitis: Secondary | ICD-10-CM | POA: Diagnosis not present

## 2018-08-08 DIAGNOSIS — Z9889 Other specified postprocedural states: Secondary | ICD-10-CM | POA: Diagnosis not present

## 2018-08-08 NOTE — Progress Notes (Signed)
[facility] VISIT   Primary Care Provider Rawad, Bochicchio, MD [address] [place] [address] [phone]  Referring Provider Susy Frizzle, MD [address] [place],  [address] [phone]  Patient Profile: Nathaniel Smith is a 62 y.o. male with a pmh significant for Prostate Cancer, HLD, HTN, Pyrosis/GERD, hemorrhoids.  The patient presents to the [facility] for an evaluation and management of problem(s) noted below:  Problem List 1. Pyrosis   2. Gastroesophageal reflux disease, esophagitis presence not specified   3. Atypical chest pain   4. History of colonoscopy     History of Present Illness: Please see prior consultation note for full HPI.  In brief, this patient describes new onset pyrosis and reflux beginning in April of this year that has occurred on multiple times/days since that onset.  He has had in the past issues with late night snacking and eating as well as some dietary indiscretions with chocolates as well as coffee and caffeine use.  He has tried to minimize that as much as possible.  He was seen at his primary care physician back in June and he was prescribed Dexilant 60 mg every morning with breakfast.  When he followed up in July and his symptoms were slightly improved but he continued did not need to use condoms or other antacids to allow improvement of his symptoms including Zantac over-the-counter.  He was prescribed Dexilant but could not get that filled due to insurance denial.  He was prescribed Protonix but could not get that filled due to insurance denial.  He was prescribed omeprazole thereafter.   He had a prior colonoscopy done at the [facility] within the last 5 to 6 years but cannot recall the timing for follow-up.  He does not take significant nonsteroidals or BC/Goody powders.  He has never had an upper endoscopy.  The plan at the time of our last clinic visit was to increase his  PPI to twice daily dosing as well as use Zantac on an as-needed basis most likely at the end of the evening before he went to bed.  Interval History: Patient returns today in a frustrated manner as he continues to have symptoms that are occurring intermittently.  He is removed many of the things that he thought and that we discussed were potential sources for the development of acid reflux.  He will go 1 to 3 days without symptoms and then may have symptoms on night and then the next night and the next night.  It remains intermittent.  He has no overt dysphagia.  He has no odynophagia.  His weight is stable.  We did not receive his colonoscopy report from the [facility] yet.  GI Review of Systems Positive as above Negative for abdominal pain, nausea, vomiting, bloating, change in bowel habits, melena, hematochezia  Review of Systems General: Denies fevers/chills Cardiovascular: Denies palpitations Pulmonary: Denies shortness of breath Gastroenterological: See HPI Genitourinary: Denies dark and urine Hematological: Denies easy bruising Dermatological: Denies jaundice Psychological: Mood is stable Musculoskeletal: Denies new arthralgias   Medications Current Outpatient Medications  Medication Sig Dispense Refill  . amLODipine (NORVASC) 10 MG tablet TAKE 1 TABLET BY MOUTH DAILY - DISCONTINUE 5MG  DOSAGE (Patient not taking: Reported on 08/08/2018) 90 tablet 3  . amoxicillin (AMOXIL) 875 MG tablet Take 1 tablet (875 mg total) by mouth 2 (two) times daily. (Patient not taking: Reported on 08/08/2018) 20 tablet 0  . atorvastatin (  LIPITOR) 10 MG tablet TAKE 1 TABLET BY MOUTH DAILY (Patient not taking: Reported on 08/08/2018) 90 tablet 3  . Ferrous Sulfate (IRON) 28 MG TABS Take by mouth.    . losartan (COZAAR) 100 MG tablet TAKE 1 TABLET BY MOUTH DAILY (Patient not taking: Reported on 08/08/2018) 90 tablet 3  . Magnesium 500 MG TABS Take by mouth.    . Multiple Vitamin (MULTIVITAMIN) tablet Take  1 tablet by mouth daily.    [name] omeprazole (PRILOSEC) 40 MG capsule Take 1 capsule (40 mg total) by mouth 2 (two) times daily at 8 am and 10 pm. (Patient not taking: Reported on 08/08/2018) 60 capsule 3  . ranitidine (ZANTAC) 150 MG capsule Take 150 mg by mouth 2 (two) times daily.     No current facility-administered medications for this visit.     Allergies No Known Allergies  Histories Past Medical History:  Diagnosis Date  . Cancer ([place]) 07/02/10   prostate  . Hyperlipidemia 11/26/2009  . Hypertension 11/26/2010  . Pyrosis    Past Surgical History:  Procedure Laterality Date  . CARPAL TUNNEL RELEASE Right 10/22/2006   [facility]  . PROSTATE SURGERY  09/2010   seeds implant   Social History   Socioeconomic History  . Marital status: Divorced    Spouse name: Not on file  . Number of children: 1  . Years of education: Not on file  . Highest education level: Not on file  Occupational History  . Not on file  Social Needs  . Financial resource strain: Not on file  . Food insecurity:    Worry: Not on file    Inability: Not on file  . Transportation needs:    Medical: Not on file    Non-medical: Not on file  Tobacco Use  . Smoking status: Former Research scientist (life sciences)  . Smokeless tobacco: Never Used  Substance and Sexual Activity  . Alcohol use: No  . Drug use: No  . Sexual activity: Yes    Birth control/protection: Condom  Lifestyle  . Physical activity:    Days per week: Not on file    Minutes per session: Not on file  . Stress: Not on file  Relationships  . Social connections:    Talks on phone: Not on file    Gets together: Not on file    Attends religious service: Not on file    Active member of club or organization: Not on file    Attends meetings of clubs or organizations: Not on file    Relationship status: Not on file  . Intimate partner violence:    Fear of current or ex partner: Not on file    Emotionally abused: Not on file    Physically abused: Not on  file    Forced sexual activity: Not on file  Other Topics Concern  . Not on file  Social History Narrative   He drives dump trucks. Hauls rock, dirt, etc.   Divorced. Has one son and one stepdaughter.   :Walks on  Treadmill mill for 1 mile every morning Monday through Friday.   He smoked 1/2 pack a day starting at age 86 and quit at age 65.--smoked for about 26 years.    Has Smoked none for 13 years.   Family History  Problem Relation Age of Onset  . Diabetes Father   . Heart disease Father   . Heart disease Brother 54       Stent at 54--CAD  . Colon cancer  Maternal Uncle   . Heart disease Paternal Aunt   . Prostate cancer Paternal Uncle   . Heart disease Paternal Uncle   . Esophageal cancer Neg Hx   . Inflammatory bowel disease Neg Hx   . Liver disease Neg Hx   . Pancreatic cancer Neg Hx   . Rectal cancer Neg Hx   . Stomach cancer Neg Hx    I have reviewed his medical, social, and family history in detail and updated the electronic medical record as necessary.    PHYSICAL EXAMINATION  BP (!) 144/78   Pulse 67   Ht 5\' 10"  (1.778 m)   Wt 200 lb (90.7 kg)   BMI 28.70 kg/m  Wt Readings from Last 3 Encounters:  08/08/18 200 lb (90.7 kg)  07/17/18 202 lb (91.6 kg)  06/30/18 201 lb 12.8 oz (91.5 kg)  GEN: NAD, appears stated age, doesn't appear chronically ill PSYCH: Cooperative, without pressured speech EYE: Conjunctivae pink, sclerae anicteric ENT: MMM, without oral ulcers, no erythema or exudates noted NECK: Supple CV: RR without R/Gs  RESP: CTAB posteriorly, without wheezing GI: NABS, soft, NT/ND, without rebound or guarding, no HSM appreciated MSK/EXT: No lower extremity edema SKIN: No jaundice NEURO:  Alert & Oriented x 3, no focal deficits   REVIEW OF DATA  I reviewed the following data at the time of this encounter:  GI Procedures and Studies  Reported prior colonoscopy at the [facility] will need to be obtained for review  Laboratory Studies  Reviewed  in EPIC with no evidence of anemia  Imaging Studies  No relevant studies to review   ASSESSMENT  Mr. Calvillo is a 62 y.o. male with a pmh significant for Prostate Cancer, HLD, HTN, Pyrosis/GERD, hemorrhoids.   The patient is seen today for evaluation and management of:  1. Pyrosis   2. Gastroesophageal reflux disease, esophagitis presence not specified   3. Atypical chest pain   4. History of colonoscopy    The patient remains hemodynamically stable but has persistent issues of atypical chest discomfort that likely is heartburn/reflux related.  Being on high-dose PPI therapy has not caused significant improvement in his symptoms.  What helps mostly is the use of Zantac on an as-needed basis when it occurs.  He is following most lifestyle modifications to try to minimize food intake before bedtime as well as before lying flat and/or removing the late night snacker habits.  At this point in time an upper endoscopy is indicated to rule out esophagitis, Barrett's, EOE and other structural etiologies or problems including malignancy in the setting of new onset heartburn/reflux.  If the upper endoscopy is negative then we discussed the role of pH impedance studies as well as having his PCP ensure that there are no other cardiac etiologies for his atypical chest discomfort.  We will maintain PPI therapy at current dosing for now.  The patient has already had a PPI change from [place] to omeprazole (more so a result of insurance denial) are not clear that a PPI exchange would be indicated right now.  All patient questions were answered, to the best of my ability, and the patient agrees to the aforementioned plan of action with follow-up as indicated.   PLAN  1. Pyrosis - Maintain PPI BID dosing - Lifestyle modifications as discussed previously as well as a food diary need to be kept  2. Gastroesophageal reflux disease, esophagitis presence not specified - EGD for structural evaluation and esophageal  biopsies to rule out EOE  and Barrett's  3. Atypical chest pain -Work-up GERD however if this is negative would ask the PCP continue to consider role of cardiac stress testing  4. [facility] - [facility]   Orders Placed This Encounter  Procedures  . Ambulatory referral to Gastroenterology    New Prescriptions   No medications on file   Modified Medications   No medications on file    Planned Follow Up: No follow-ups on file.   Justice Britain, MD [facility] Advanced [facility]oscopy Office # [phone]

## 2018-08-08 NOTE — Patient Instructions (Signed)
You have been scheduled for an endoscopy. Please follow written instructions given to you at your visit today. If you use inhalers (even only as needed), please bring them with you on the day of your procedure. Your physician has requested that you go to www.startemmi.com and enter the access code given to you at your visit today. This web site gives a general overview about your procedure. However, you should still follow specific instructions given to you by our office regarding your preparation for the procedure.  Thank you for entrusting me with your care and choosing [facility].  Dr Mansouraty  

## 2018-08-10 ENCOUNTER — Encounter: Payer: Self-pay | Admitting: Gastroenterology

## 2018-08-14 ENCOUNTER — Encounter: Payer: Self-pay | Admitting: Gastroenterology

## 2018-08-19 ENCOUNTER — Ambulatory Visit (AMBULATORY_SURGERY_CENTER): Payer: Managed Care, Other (non HMO) | Admitting: Gastroenterology

## 2018-08-19 ENCOUNTER — Encounter: Payer: Self-pay | Admitting: Gastroenterology

## 2018-08-19 VITALS — BP 134/84 | HR 84 | Temp 97.3°F | Resp 20 | Ht 70.0 in | Wt 200.0 lb

## 2018-08-19 DIAGNOSIS — K210 Gastro-esophageal reflux disease with esophagitis: Secondary | ICD-10-CM | POA: Diagnosis present

## 2018-08-19 DIAGNOSIS — R12 Heartburn: Secondary | ICD-10-CM

## 2018-08-19 MED ORDER — SODIUM CHLORIDE 0.9 % IV SOLN: 500.0000 mL | Freq: Once | INTRAVENOUS | Status: DC

## 2018-08-19 NOTE — Progress Notes (Signed)
To PACU, VSS. Report to [email or web address] 

## 2018-08-19 NOTE — Progress Notes (Signed)
Called to room to assist during endoscopic procedure.  Patient ID and intended procedure confirmed with present staff. Received instructions for my participation in the procedure from the performing physician.  

## 2018-08-19 NOTE — Patient Instructions (Signed)
YOU HAD AN ENDOSCOPIC PROCEDURE TODAY AT [facility]:   Refer to the procedure report that was given to you for any specific questions about what was found during the examination.  If the procedure report does not answer your questions, please call your gastroenterologist to clarify.  If you requested that your care partner not be given the details of your procedure findings, then the procedure report has been included in a sealed envelope for you to review at your convenience later.  YOU SHOULD EXPECT: Some feelings of bloating in the abdomen. Passage of more gas than usual.  Walking can help get rid of the air that was put into your GI tract during the procedure and reduce the bloating. If you had a lower endoscopy (such as a colonoscopy or flexible sigmoidoscopy) you may notice spotting of blood in your stool or on the toilet paper. If you underwent a bowel prep for your procedure, you may not have a normal bowel movement for a few days.  Please Note:  You might notice some irritation and congestion in your nose or some drainage.  This is from the oxygen used during your procedure.  There is no need for concern and it should clear up in a day or so.  SYMPTOMS TO REPORT IMMEDIATELY:     Following upper endoscopy (EGD)  Vomiting of blood or coffee ground material  New chest pain or pain under the shoulder blades  Painful or persistently difficult swallowing  New shortness of breath  Fever of 100F or higher  Black, tarry-looking stools  For urgent or emergent issues, a gastroenterologist can be reached at any hour by calling [phone].   DIET:  We do recommend a small meal at first, but then you may proceed to your regular diet.  Drink plenty of fluids but you should avoid alcoholic beverages for 24 hours.  MEDICATIONS: Continue present medications.  Lifestyle modifications can be continued including: head of bed >30 degree (bed wedge better than pillows), not eating  at least 2 to 3 hours before bed, not lying flat after a meal for at least 2 hours, and avoiding food that exacerbate your symptoms.  Consider using current medications for 4 weeks and then decrease to once daily dosing PPI.  Please see handouts given to you by your recovery nurse.  ACTIVITY:  You should plan to take it easy for the rest of today and you should NOT DRIVE or use heavy machinery until tomorrow (because of the sedation medicines used during the test).    FOLLOW UP: Our staff will call the number listed on your records the next business day following your procedure to check on you and address any questions or concerns that you may have regarding the information given to you following your procedure. If we do not reach you, we will leave a message.  However, if you are feeling well and you are not experiencing any problems, there is no need to return our call.  We will assume that you have returned to your regular daily activities without incident.  If any biopsies were taken you will be contacted by phone or by letter within the next 1-3 weeks.  Please call us at [phone] if you have not heard about the biopsies in 3 weeks.   Thank you for allowing [place] to to provide for your healthcare needs today.  SIGNATURES/CONFIDENTIALITY: You and/or your care partner have signed paperwork which will be entered into your electronic medical  record.  These signatures attest to the fact that that the information above on your After Visit Summary has been reviewed and is understood.  Full responsibility of the confidentiality of this discharge information lies with you and/or your care-partner. 

## 2018-08-19 NOTE — Op Note (Signed)
[place] Patient Name: Nathaniel Smith Procedure Date: 08/19/2018 11:24 AM MRN: [id number] Endoscopist: Justice Britain , MD Age: 62 Referring MD:  Date of Birth: [birth year] Gender: Male Account #: [id number] Procedure:                Upper GI endoscopy Indications:              Heartburn, Gastro-esophageal reflux disease,                            Follow-up of gastro-esophageal reflux disease Medicines:                Monitored Anesthesia Care Procedure:                Pre-Anesthesia Assessment:                           - Prior to the procedure, a History and Physical                            was performed, and patient medications and                            allergies were reviewed. The patient's tolerance of                            previous anesthesia was also reviewed. The risks                            and benefits of the procedure and the sedation                            options and risks were discussed with the patient.                            All questions were answered, and informed consent                            was obtained. Prior Anticoagulants: The patient has                            taken no previous anticoagulant or antiplatelet                            agents. ASA Grade Assessment: II - A patient with                            mild systemic disease. After reviewing the risks                            and benefits, the patient was deemed in                            satisfactory condition to undergo the procedure.  After obtaining informed consent, the endoscope was                            passed under direct vision. Throughout the                            procedure, the patient's blood pressure, pulse, and                            oxygen saturations were monitored continuously. The                            Endoscope was introduced through the mouth, and                            advanced to  the second part of duodenum. The upper                            GI endoscopy was accomplished without difficulty.                            The patient tolerated the procedure. Scope In: Scope Out: Findings:                 No gross lesions were noted in the entire                            esophagus. Biopsies were taken with a cold forceps                            for histology from the proximal/middle esophagus to                            rule out EoE. Biopsies were taken with a cold                            forceps for histology from the distal esophagus to                            rule out EoE.                           The Z-line was regular and was found 40 cm from the                            incisors.                           No other gross lesions were noted in the entire                            examined stomach. Biopsies were taken with a cold  forceps for histology and Helicobacter pylori                            testing from the antrum/incisura/greater                            curve/lesser curve.                           No gross lesions were noted in the duodenal bulb,                            in the first portion of the duodenum and in the                            second portion of the duodenum. Complications:            No immediate complications. Estimated Blood Loss:     Estimated blood loss was minimal. Impression:               - No gross lesions in esophagus. Biopsied for EoE.                           - Z-line regular, 40 cm from the incisors.                           - No gross lesions in the stomach. Biopsied for HP.                           - No gross lesions in the duodenal bulb, in the                            first portion of the duodenum and in the second                            portion of the duodenum. Recommendation:           - The patient will be observed post-procedure,                             until all discharge criteria are met.                           - Discharge patient to home.                           - Patient has a contact number available for                            emergencies. The signs and symptoms of potential                            delayed complications were discussed with the  patient. Return to normal activities tomorrow.                            Written discharge instructions were provided to the                            patient.                           - Resume previous diet.                           - Continue present medications.                           - Await pathology results.                           - Lifestyle modifications can be continued                            including: HOB >30 degree (Bed Wedge better than                            Pillows), Not eating <2-3 hours before bed, Not                            laying flat after a meal for 2-hours, Avoiding                            foodstuffs that exacerbate symptoms.                           - Would continue current medications for 4-weeks                            and then decrease to once daily dosing PPI.                           - Will consider the role of pH impedence testing.                           - The findings and recommendations were discussed                            with the patient.                           - The findings and recommendations were discussed                            with the patient's family. Justice Britain, MD 08/19/2018 11:58:42 AM

## 2018-08-20 ENCOUNTER — Telehealth: Payer: Self-pay | Admitting: *Deleted

## 2018-08-20 NOTE — Telephone Encounter (Signed)
  Follow up Call-  Call back number 08/19/2018  Post procedure Call Back phone  # [phone]  Permission to leave phone message Yes  Some recent data might be hidden     Patient questions:  Do you have a fever, pain , or abdominal swelling? No. Pain Score  0 *  Have you tolerated food without any problems? Yes.    Have you been able to return to your normal activities? Yes.    Do you have any questions about your discharge instructions: Diet   No. Medications  No. Follow up visit  No.  Do you have questions or concerns about your Care? No.  Actions: * If pain score is 4 or above: No action needed, pain <4.  Pt did say yesterday when he got home that when he ate or drank,he felt like he could feel drink or food going down esophagus . Not actually painful that he couldn't tolerate but he could feel it going down more than usual. Told pt to try just eating soft foods today and to let us know if this feeling continues. Pt denied and bleeding ,chest discomfort, or fever .

## 2018-08-28 ENCOUNTER — Encounter: Payer: Self-pay | Admitting: Gastroenterology

## 2018-09-05 ENCOUNTER — Encounter: Payer: Self-pay | Admitting: Family Medicine

## 2018-09-05 ENCOUNTER — Ambulatory Visit: Payer: Managed Care, Other (non HMO) | Admitting: Family Medicine

## 2018-09-05 VITALS — BP 128/84 | HR 80 | Temp 97.7°F | Ht 70.0 in | Wt 205.0 lb

## 2018-09-05 DIAGNOSIS — Z23 Encounter for immunization: Secondary | ICD-10-CM

## 2018-09-05 DIAGNOSIS — M436 Torticollis: Secondary | ICD-10-CM

## 2018-09-05 MED ORDER — CYCLOBENZAPRINE HCL 10 MG PO TABS: 10.0000 mg | ORAL_TABLET | Freq: Three times a day (TID) | ORAL | 0 refills | Status: DC | PRN

## 2018-09-05 NOTE — Progress Notes (Signed)
Subjective:    Patient ID: Nathaniel Smith, male    DOB: [birth year], 62 y.o.   MRN: [id number]  Patient has been dealing with acid reflux for approximately 6 months.  During that time, I have asked the patient to sleep with the head of his bed abated to help treat the acid reflux.  Beginning roughly the same time, the patient developed pain and stiffness in his neck.  He states it is worse first thing in the morning.  No matter which pill he tries, he is unable to get his neck to relax.  The neck feels stiff and tight.  It hurts to turn his chin and side to side.  He has less pain with flexion and extension of the neck.  He denies any numbness or tingling in his arms.  He denies any weakness in his arms.  He denies any radicular pain shooting down his arms.  He is tried massages, CBD oil, heat, and no improvement has been obtained.  The pain is located in the trapezius and sternocleidomastoid muscles bilaterally.  Each side is equally affected.  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 Cancer ([place]) 07/02/10   prostate  . Hyperlipidemia 11/26/2009  . Hypertension 11/26/2010  . Pyrosis    Past Surgical History:  Procedure Laterality Date  . CARPAL TUNNEL RELEASE Right 10/22/2006   [facility]  . PROSTATE SURGERY  09/2010   seeds implant   Current Outpatient Medications on File Prior to Visit  Medication Sig Dispense Refill  . amLODipine (NORVASC) 10 MG tablet TAKE 1 TABLET BY MOUTH DAILY - DISCONTINUE 5MG  DOSAGE 90 tablet 3  . atorvastatin (LIPITOR) 10 MG tablet TAKE 1 TABLET BY MOUTH DAILY 90 tablet 3  . cetirizine (ZYRTEC) 10 MG tablet Take 10 mg by mouth daily.    . Ferrous Sulfate (IRON) 28 MG TABS Take by mouth.    . losartan (COZAAR) 100 MG tablet TAKE 1 TABLET BY MOUTH DAILY 90 tablet 3  . Magnesium 500 MG TABS Take by  mouth.    . Multiple Vitamin (MULTIVITAMIN) tablet Take 1 tablet by mouth daily.    [name] omeprazole (PRILOSEC) 40 MG capsule Take 1 capsule (40 mg total) by mouth 2 (two) times daily at 8 am and 10 pm. 60 capsule 3   No current facility-administered medications on file prior to visit.    No Known Allergies Social History   Socioeconomic History  . Marital status: Divorced    Spouse name: Not on file  . Number of children: 1  . Years of education: Not on file  . Highest education level: Not on file  Occupational History  . Not on file  Social Needs  . Financial resource strain: Not on file  . Food insecurity:    Worry: Not on file    Inability: Not on file  . Transportation needs:    Medical: Not on file    Non-medical: Not on file  Tobacco Use  . Smoking status: Former Research scientist (life sciences)  . Smokeless tobacco: Never Used  Substance and Sexual Activity  . Alcohol use: No  . Drug use: No  . Sexual activity: Yes    Birth control/protection: Condom  Lifestyle  . Physical activity:    Days per week: Not on file    Minutes per session: Not on  file  . Stress: Not on file  Relationships  . Social connections:    Talks on phone: Not on file    Gets together: Not on file    Attends religious service: Not on file    Active member of club or organization: Not on file    Attends meetings of clubs or organizations: Not on file    Relationship status: Not on file  . Intimate partner violence:    Fear of current or ex partner: Not on file    Emotionally abused: Not on file    Physically abused: Not on file    Forced sexual activity: Not on file  Other Topics Concern  . Not on file  Social History Narrative   He drives dump trucks. Hauls rock, dirt, etc.   Divorced. Has one son and one stepdaughter.   :Walks on  Treadmill mill for 1 mile every morning Monday through Friday.   He smoked 1/2 pack a day starting at age 74 and quit at age 27.--smoked for about 26 years.    Has Smoked none for 13  years.   Family History  Problem Relation Age of Onset  . Diabetes Father   . Heart disease Father   . Heart disease Brother 54       Stent at 54--CAD  . Colon cancer Maternal Uncle   . Heart disease Paternal Aunt   . Prostate cancer Paternal Uncle   . Heart disease Paternal Uncle   . Esophageal cancer Neg Hx   . Inflammatory bowel disease Neg Hx   . Liver disease Neg Hx   . Pancreatic cancer Neg Hx   . Rectal cancer Neg Hx   . Stomach cancer Neg Hx       Review of Systems  Musculoskeletal: Positive for neck pain.  All other systems reviewed and are negative.      Objective:   Physical Exam  Constitutional: He appears well-developed and well-nourished. No distress.  Neck: Normal range of motion. Neck supple. No JVD present. No tracheal deviation present. No thyromegaly present.    Cardiovascular: Normal rate, regular rhythm, normal heart sounds and intact distal pulses. Exam reveals no gallop and no friction rub.  No murmur heard. Pulmonary/Chest: Effort normal and breath sounds normal. No stridor. No respiratory distress. He has no wheezes. He has no rales. He exhibits no tenderness.  Musculoskeletal:       Cervical back: He exhibits tenderness, pain and spasm. He exhibits normal range of motion, no bony tenderness, no swelling, no edema, no deformity and no laceration.  Lymphadenopathy:    He has no cervical adenopathy.  Skin: He is not diaphoretic.  Vitals reviewed.         Assessment & Plan:  Neck stiffness - Plan: DG Cervical Spine Complete I believe a lot of his pain is muscular in nature.  I recommended trying Flexeril 10 mg p.o. nightly prior to bed to relieve muscle spasms and muscle tightness.  We need to avoid NSAIDs given his recent severe GERD.  Recheck in 1 week.  Obtain x-rays of the neck to evaluate for degenerative disc disease or facet arthritis.  Consider physical therapy if no better.

## 2018-09-05 NOTE — Addendum Note (Signed)
Addended by: Ofilia Neas R on: 09/05/2018 04:19 PM   Modules accepted: Orders

## 2018-10-27 ENCOUNTER — Other Ambulatory Visit: Payer: Self-pay | Admitting: Family Medicine

## 2018-10-27 ENCOUNTER — Encounter: Payer: Self-pay | Admitting: Family Medicine

## 2018-10-27 ENCOUNTER — Ambulatory Visit (HOSPITAL_COMMUNITY)
Admission: RE | Admit: 2018-10-27 | Discharge: 2018-10-27 | Disposition: A | Discharge status: Home / Self Care | Payer: 59 | Source: Ambulatory Visit | Attending: Family Medicine | Admitting: Family Medicine

## 2018-10-27 DIAGNOSIS — M542 Cervicalgia: Secondary | ICD-10-CM

## 2018-10-27 DIAGNOSIS — M4803 Spinal stenosis, cervicothoracic region: Secondary | ICD-10-CM | POA: Diagnosis not present

## 2018-10-27 MED ORDER — CYCLOBENZAPRINE HCL 10 MG PO TABS: 10.0000 mg | ORAL_TABLET | Freq: Three times a day (TID) | ORAL | 0 refills | Status: DC | PRN

## 2018-10-28 ENCOUNTER — Encounter: Payer: Self-pay | Admitting: Family Medicine

## 2018-10-28 DIAGNOSIS — S161XXA Strain of muscle, fascia and tendon at neck level, initial encounter: Secondary | ICD-10-CM

## 2018-10-28 DIAGNOSIS — M5030 Other cervical disc degeneration, unspecified cervical region: Secondary | ICD-10-CM

## 2018-11-05 ENCOUNTER — Encounter (HOSPITAL_COMMUNITY): Payer: Self-pay

## 2018-11-05 ENCOUNTER — Ambulatory Visit (HOSPITAL_COMMUNITY): Payer: 59 | Attending: Family Medicine

## 2018-11-05 ENCOUNTER — Other Ambulatory Visit: Payer: Self-pay

## 2018-11-05 DIAGNOSIS — M62838 Other muscle spasm: Secondary | ICD-10-CM | POA: Diagnosis not present

## 2018-11-05 DIAGNOSIS — R29898 Other symptoms and signs involving the musculoskeletal system: Secondary | ICD-10-CM | POA: Insufficient documentation

## 2018-11-05 DIAGNOSIS — M542 Cervicalgia: Secondary | ICD-10-CM | POA: Insufficient documentation

## 2018-11-05 NOTE — Therapy (Signed)
Nathaniel Smith, [place], [address] Phone: [phone]   Fax:  [phone]  Physical Therapy Evaluation  Patient Details  Name: Nathaniel Smith MRN: [id number] Date of Birth: Feb 26, 1956 Referring Provider (PT): Susy Frizzle, MD   Encounter Date: 11/05/2018  PT End of Session - 11/05/18 1140    Visit Number  1    Number of Visits  9    Date for PT Re-Evaluation  12/03/18    Authorization Type  [employer]    Authorization Time Period  11/05/18 to 12/03/18    Authorization - Visit Number  1    Authorization - Number of Visits  20    PT Start Time  0815    PT Stop Time  0852    PT Time Calculation (min)  37 min    Activity Tolerance  Patient tolerated treatment well    Behavior During Therapy  [facility] for tasks assessed/performed       Past Medical History:  Diagnosis Date  . Cancer ([place]) 07/02/10   prostate  . Hyperlipidemia 11/26/2009  . Hypertension 11/26/2010  . Pyrosis     Past Surgical History:  Procedure Laterality Date  . CARPAL TUNNEL RELEASE Right 10/22/2006   [facility]  . PROSTATE SURGERY  09/2010   seeds implant    There were no vitals filed for this visit.   Subjective Assessment - 11/05/18 0818    Subjective  Pt reports that he has been bil neck pain for about 1 year, L>R. He states it started insidiously. He states that his wife keeps changing his pillow but it has not helped his pain. He states that Dr. Dennard Schaumann gave him Flexeril which hasn't really helped the pain. His wife tried to massage his neck and he states that it aggravated his pain. No radicular pain down BUE. Turning his head aggravates his pain and if he reaches [facility] he feels serious discomfort in his upper trap region. He is self-employed in Architect and states it is his slow season right now but the pain didn't limit him when he was working more back in the Fall.     Limitations  Lifting    How long can you sit  comfortably?  no issues    How long can you stand comfortably?  no issues    How long can you walk comfortably?  no issues    Patient Stated Goals  reduce pain    Currently in Pain?  No/denies         [facility] PT Assessment - 11/05/18 0001      Assessment   Medical Diagnosis  DDD, cervical, strain of neck muscle, initial encounter    Referring Provider (PT)  Susy Frizzle, MD    Onset Date/Surgical Date  --   year ago   Next MD Visit  none scheduled yet    Prior Therapy  none      Balance Screen   Has the patient fallen in the past 6 months  No    Has the patient had a decrease in activity level because of a fear of falling?   No    Is the patient reluctant to leave their home because of a fear of falling?   No      Prior Function   Level of Independence  Independent    Vocation  Full time employment    Gaffer, Architect    Leisure  drive sports car      Observation/Other Assessments   Focus on Therapeutic Outcomes (FOTO)   to be completed next visit    Other Surveys   Other Surveys    Neck Disability Index   4/50      Functional Tests   Functional tests  Other      Other:   Other/ Comments  DNFE test: 60 sec, 5/10 discomfort      Posture/Postural Control   Posture/Postural Control  Postural limitations    Postural Limitations  Rounded Shoulders;Forward head;Increased thoracic kyphosis      ROM / Strength   AROM / PROM / Strength  AROM;Strength      AROM   AROM Assessment Site  Cervical    Cervical Flexion  46    Cervical Extension  34    Cervical - Right Side Bend  25   discomfort on L   Cervical - Left Side Bend  27   discomfort on L   Cervical - Right Rotation  73    Cervical - Left Rotation  52      Strength   Strength Assessment Site  Shoulder;Elbow;Hand    Right Shoulder Flexion  4+/5    Right Shoulder ABduction  4+/5    Right Shoulder Internal Rotation  5/5    Right Shoulder External Rotation  5/5    Left  Shoulder Flexion  4+/5   min pain   Left Shoulder ABduction  4+/5   min pain   Left Shoulder Internal Rotation  5/5    Left Shoulder External Rotation  5/5    Right Elbow Flexion  5/5    Right Elbow Extension  5/5    Left Elbow Flexion  5/5    Left Elbow Extension  5/5    Right Hand Gross Grasp  Functional    Right Hand Grip (lbs)  100    Left Hand Gross Grasp  Functional    Left Hand Grip (lbs)  90      Palpation   Spinal mobility  cervical and thoracic hypomobile; CPAs to lower cervical spine referred into his L upper trap region    Palpation comment  mod-max restrictions and palable trigger points in bil upper trap, L>R, which recreated his same pain      Special Tests    Special Tests  Cervical    Cervical Tests  Dictraction;Spurling's      Spurling's   Findings  Negative    Side  Left      Distraction Test   Findngs  Negative           Objective measurements completed on examination: See above findings.        PT Education - 11/05/18 1140    Education Details  exam findings, HEP, POC, dry needling risks and benefits    Person(s) Educated  Patient    Methods  Explanation;Demonstration;Handout    Comprehension  Verbalized understanding;Returned demonstration       PT Short Term Goals - 11/05/18 1146      PT SHORT TERM GOAL #1   Title  Pt will be independent with HEP and perform consistently in order to reduce pain.    Time  2    Period  Weeks    Status  New    Target Date  11/19/18      PT SHORT TERM GOAL #2   Title  Pt will have improved bil cervical rotation and cervical extension by 10  deg in order to demo reduced soft tissue restrictions and allow him to drive with greater ease.     Time  2    Period  Weeks    Status  New      PT SHORT TERM GOAL #3   Title  Pt will report being able to sleep without awakening due to neck pain to demo improved function and to maximize his overall recovery.     Time  2    Period  Weeks    Status  New         PT Long Term Goals - 11/05/18 1147      PT LONG TERM GOAL #1   Title  Pt will have improved L cervical rotation to 75deg or > in order to further demo reduced restrictions in L upper trap and decrease his pain with cervical ROM.     Time  4    Period  Weeks    Status  New    Target Date  12/03/18      PT LONG TERM GOAL #2   Title  Pt will be able to perform DNFE test for 60seconds with 0/10 discomfort to demo reduced overall pain.     Time  4    Period  Weeks    Status  New      PT LONG TERM GOAL #3   Title  Pt will report being able to perform OH tasks without neck pain to demo improved functional strength and maximize function at home and work.    Time  4    Period  Weeks    Status  New             Plan - 11/05/18 1141    Clinical Impression Statement  Pt is pleasant 63YO M who presents to OPPT with c/o bil neck pain, L>R. He presents with deficits in joint mobility in cervical and thoracic spine, cervical AROM, difficulty with OH tasks, posture, and overall soft tissue restrictions. Pt with palpable trigger points in bil upper trap, L>R, and reported recreation of same pain to palpation. PT feels pt would benefit from trigger point dry needling to reduce these spasms and reduce his overall pain. Pt needs skilled PT intervention in order to reduce his pain and promote return to PLOF.     Clinical Presentation  Stable    Clinical Presentation due to:  see flowsheets for objective tests and measures    Clinical Decision Making  Low    Rehab Potential  Good    PT Frequency  2x / week    PT Duration  4 weeks    PT Treatment/Interventions  ADLs/Self Care Home Management;Cryotherapy;Electrical Stimulation;Moist Heat;Traction;Functional mobility training;Therapeutic activities;Therapeutic exercise;Balance training;Neuromuscular re-education;Patient/family education;Manual techniques;Scar mobilization;Passive range of motion;Dry needling;Energy conservation;Taping;Spinal  Manipulations;Joint Manipulations    PT Next Visit Plan  review goals, administer FOTO; initiate dry needling to L upper trap, postural strength, thoracic and cervical mobility    PT Home Exercise Plan  eval: upper trap stretch    Consulted and Agree with Plan of Care  Patient       Patient will benefit from skilled therapeutic intervention in order to improve the following deficits and impairments:  Decreased range of motion, Hypomobility, Increased fascial restricitons, Increased muscle spasms, Impaired flexibility, Postural dysfunction, Improper body mechanics, Pain, Decreased activity tolerance  Visit Diagnosis: Cervicalgia - Plan: PT plan of care cert/re-cert  Other muscle spasm - Plan: PT plan of care cert/re-cert  Other  symptoms and signs involving the musculoskeletal system - Plan: PT plan of care cert/re-cert     Problem List Patient Active Problem List   Diagnosis Date Noted  . Gastroesophageal reflux disease 07/01/2018  . Pyrosis 07/01/2018  . Atypical chest pain 07/01/2018  . History of colonoscopy 07/01/2018  . Hemorrhoids 07/01/2018  . Family history of premature coronary artery disease 12/16/2013  . Hypertension 11/26/2010  . Cancer ([place]) 07/02/2010  . Hyperlipidemia 11/26/2009         Nathaniel Smith PT, DPT   [facility] [address] [place], [place], [address] Phone: [phone]   Fax:  [phone]  Name: Nathaniel Smith MRN: [id number] Date of Birth: 1956/04/12

## 2018-11-05 NOTE — Patient Instructions (Signed)

## 2018-11-12 ENCOUNTER — Encounter (HOSPITAL_COMMUNITY): Payer: Self-pay

## 2018-11-12 ENCOUNTER — Ambulatory Visit (HOSPITAL_COMMUNITY): Payer: 59

## 2018-11-12 DIAGNOSIS — M542 Cervicalgia: Secondary | ICD-10-CM | POA: Diagnosis not present

## 2018-11-12 DIAGNOSIS — R29898 Other symptoms and signs involving the musculoskeletal system: Secondary | ICD-10-CM

## 2018-11-12 DIAGNOSIS — M62838 Other muscle spasm: Secondary | ICD-10-CM

## 2018-11-12 NOTE — Therapy (Signed)
[place], [place], [address] Phone: [phone]   Fax:  [phone]  Physical Therapy Treatment  Patient Details  Name: Nathaniel Smith MRN: [id number] Date of Birth: August 30, 1956 Referring Provider (PT): Susy Frizzle, MD   Encounter Date: 11/12/2018  PT End of Session - 11/12/18 0905    Visit Number  2    Number of Visits  9    Date for PT Re-Evaluation  12/03/18    Authorization Type  [employer]    Authorization Time Period  11/05/18 to 12/03/18    Authorization - Visit Number  2    Authorization - Number of Visits  20    PT Start Time  0905    PT Stop Time  0945    PT Time Calculation (min)  40 min    Activity Tolerance  Patient tolerated treatment well    Behavior During Therapy  [facility] for tasks assessed/performed       Past Medical History:  Diagnosis Date  . Cancer ([place]) 07/02/10   prostate  . Hyperlipidemia 11/26/2009  . Hypertension 11/26/2010  . Pyrosis     Past Surgical History:  Procedure Laterality Date  . CARPAL TUNNEL RELEASE Right 10/22/2006   [facility]  . PROSTATE SURGERY  09/2010   seeds implant    There were no vitals filed for this visit.  Subjective Assessment - 11/12/18 0906    Subjective  Pt states that his neck pain is still there and bothers him. He looked into the dry needling.    Limitations  Lifting    How long can you sit comfortably?  no issues    How long can you stand comfortably?  no issues    How long can you walk comfortably?  no issues    Patient Stated Goals  reduce pain    Currently in Pain?  No/denies           [facility] Adult PT Treatment/Exercise - 11/12/18 0001      Exercises   Exercises  Neck      Neck Exercises: Seated   Other Seated Exercise  bil scap retraction +depressoin x15 reps (cues to reduce upper trap compensation)      Modalities   Modalities  Moist Heat      Moist Heat Therapy   Number Minutes Moist Heat  8 Minutes    Moist Heat Location  Cervical;Shoulder      Manual Therapy   Manual Therapy  Soft tissue mobilization    Manual therapy comments  completed separate rest of treatment    Soft tissue mobilization  STM after needling      Neck Exercises: Stretches   Upper Trapezius Stretch  Left;2 reps;30 seconds      Trigger Point Dry Needling - 11/12/18 0934    Consent Given?  Yes    Education Handout Provided  Yes   at eval   Muscles Treated Upper Body  Upper trapezius    Upper Trapezius Response  Twitch reponse elicited;Palpable increased muscle length   L upper trap          PT Education - 11/12/18 1142    Education Details  expect soreness from needling, can apply heat later today; continue HEP    Person(s) Educated  Patient    Methods  Explanation;Demonstration    Comprehension  Verbalized understanding;Returned demonstration       PT Short Term Goals - 11/05/18 1146  PT SHORT TERM GOAL #1   Title  Pt will be independent with HEP and perform consistently in order to reduce pain.    Time  2    Period  Weeks    Status  New    Target Date  11/19/18      PT SHORT TERM GOAL #2   Title  Pt will have improved bil cervical rotation and cervical extension by 10 deg in order to demo reduced soft tissue restrictions and allow him to drive with greater ease.     Time  2    Period  Weeks    Status  New      PT SHORT TERM GOAL #3   Title  Pt will report being able to sleep without awakening due to neck pain to demo improved function and to maximize his overall recovery.     Time  2    Period  Weeks    Status  New        PT Long Term Goals - 11/05/18 1147      PT LONG TERM GOAL #1   Title  Pt will have improved L cervical rotation to 75deg or > in order to further demo reduced restrictions in L upper trap and decrease his pain with cervical ROM.     Time  4    Period  Weeks    Status  New    Target Date  12/03/18      PT LONG TERM GOAL #2   Title  Pt will be able to perform  DNFE test for 60seconds with 0/10 discomfort to demo reduced overall pain.     Time  4    Period  Weeks    Status  New      PT LONG TERM GOAL #3   Title  Pt will report being able to perform OH tasks without neck pain to demo improved functional strength and maximize function at home and work.    Time  4    Period  Weeks    Status  New            Plan - 11/12/18 1142    Clinical Impression Statement  Pt presented to therapy for f/u visit and was agreeable to initiate dry needling. Good twitch responses elicited throughout L upper trap and pt reporting recreation of pain. Followed up with manual, stretching, and scapular retraction and depression in order to further reduce restrictions, pain, and proper posture. Pt reporting reduced pain but some mm soreness following; PT educated him that this soreness is normal and should last for 24-48 hours following. Applied moist heat pack to cervical/shoulder region in order to further promote relaxation, reduced pain, and reduced soft tissue restrictions; reviewed pt's goals and completed FOTO while pt on moist heat pack. Continue as planned, progressing as able.     Rehab Potential  Good    PT Frequency  2x / week    PT Duration  4 weeks    PT Treatment/Interventions  ADLs/Self Care Home Management;Cryotherapy;Electrical Stimulation;Moist Heat;Traction;Functional mobility training;Therapeutic activities;Therapeutic exercise;Balance training;Neuromuscular re-education;Patient/family education;Manual techniques;Scar mobilization;Passive range of motion;Dry needling;Energy conservation;Taping;Spinal Manipulations;Joint Manipulations    PT Next Visit Plan  continue dry needling to L upper trap, begin postural strength, thoracic and cervical mobility    PT Home Exercise Plan  eval: upper trap stretch    Consulted and Agree with Plan of Care  Patient       Patient will benefit from skilled therapeutic  intervention in order to improve the following  deficits and impairments:  Decreased range of motion, Hypomobility, Increased fascial restricitons, Increased muscle spasms, Impaired flexibility, Postural dysfunction, Improper body mechanics, Pain, Decreased activity tolerance  Visit Diagnosis: Cervicalgia  Other muscle spasm  Other symptoms and signs involving the musculoskeletal system     Problem List Patient Active Problem List   Diagnosis Date Noted  . Gastroesophageal reflux disease 07/01/2018  . Pyrosis 07/01/2018  . Atypical chest pain 07/01/2018  . History of colonoscopy 07/01/2018  . Hemorrhoids 07/01/2018  . Family history of premature coronary artery disease 12/16/2013  . Hypertension 11/26/2010  . Cancer ([place]) 07/02/2010  . Hyperlipidemia 11/26/2009       Geraldine Solar PT, DPT  [facility] [address] [place], [place], [address] Phone: [phone]   Fax:  [phone]  Name: TREON KEHL MRN: [id number] Date of Birth: [birth year]

## 2018-11-13 ENCOUNTER — Ambulatory Visit (HOSPITAL_COMMUNITY): Payer: 59

## 2018-11-14 ENCOUNTER — Telehealth: Payer: Self-pay | Admitting: Family Medicine

## 2018-11-14 ENCOUNTER — Ambulatory Visit (HOSPITAL_COMMUNITY): Payer: 59

## 2018-11-14 DIAGNOSIS — E7800 Pure hypercholesterolemia, unspecified: Secondary | ICD-10-CM

## 2018-11-14 MED ORDER — LOSARTAN POTASSIUM 100 MG PO TABS: 100.0000 mg | ORAL_TABLET | Freq: Every day | ORAL | 1 refills | Status: DC

## 2018-11-14 MED ORDER — OMEPRAZOLE 40 MG PO CPDR: 40.0000 mg | DELAYED_RELEASE_CAPSULE | Freq: Two times a day (BID) | ORAL | 3 refills | Status: DC

## 2018-11-14 MED ORDER — ATORVASTATIN CALCIUM 10 MG PO TABS: 10.0000 mg | ORAL_TABLET | Freq: Every day | ORAL | 1 refills | Status: DC

## 2018-11-14 NOTE — Telephone Encounter (Signed)
Disregard last note.  Pt refill refill on atorvastatin, losartan, amlodipine, and omeprazole wants it to go to mail order [facility]. Has new mailorder.

## 2018-11-14 NOTE — Telephone Encounter (Signed)
Pt needs refill on atorvastatin, losartan, and omeprazole wants sent to [facility]

## 2018-11-14 NOTE — Telephone Encounter (Signed)
meds sent to [facility] as pt called back and stated that he was at [facility] and his meds were not ready. Vevelyn Royals explained that he wanted mail order and he said no just send to [facility] as he is about out. Meds sent to [facility]

## 2018-11-14 NOTE — Telephone Encounter (Signed)
I'm sorry - which pharmacy does he want it to go to?

## 2018-11-14 NOTE — Telephone Encounter (Signed)
[facility]

## 2018-11-18 ENCOUNTER — Encounter (HOSPITAL_COMMUNITY): Payer: Self-pay

## 2018-11-18 ENCOUNTER — Ambulatory Visit (HOSPITAL_COMMUNITY): Payer: 59

## 2018-11-18 DIAGNOSIS — M542 Cervicalgia: Secondary | ICD-10-CM

## 2018-11-18 DIAGNOSIS — M62838 Other muscle spasm: Secondary | ICD-10-CM

## 2018-11-18 DIAGNOSIS — R29898 Other symptoms and signs involving the musculoskeletal system: Secondary | ICD-10-CM

## 2018-11-18 NOTE — Therapy (Signed)
[place], [place], [address] Phone: [phone]   Fax:  [phone]  Physical Therapy Treatment  Patient Details  Name: Nathaniel Smith MRN: [id number] Date of Birth: [birth year] Referring Provider (PT): Susy Frizzle, MD   Encounter Date: 11/18/2018  PT End of Session - 11/18/18 1040    Visit Number  3    Number of Visits  9    Date for PT Re-Evaluation  12/03/18    Authorization Type  [employer]    Authorization Time Period  11/05/18 to 12/03/18    Authorization - Visit Number  3    Authorization - Number of Visits  20    PT Start Time  6962   pt late   PT Stop Time  1114    PT Time Calculation (min)  34 min    Activity Tolerance  Patient tolerated treatment well    Behavior During Therapy  [facility] for tasks assessed/performed       Past Medical History:  Diagnosis Date  . Cancer ([place]) 07/02/10   prostate  . Hyperlipidemia 11/26/2009  . Hypertension 11/26/2010  . Pyrosis     Past Surgical History:  Procedure Laterality Date  . CARPAL TUNNEL RELEASE Right 10/22/2006   [facility]  . PROSTATE SURGERY  09/2010   seeds implant    There were no vitals filed for this visit.  Subjective Assessment - 11/18/18 1040    Subjective  Pt reports that he is feeling okay. He states that it isn't hurting like it used to. He can turn his head better with very little pain.     Limitations  Lifting    How long can you sit comfortably?  no issues    How long can you stand comfortably?  no issues    How long can you walk comfortably?  no issues    Patient Stated Goals  reduce pain    Currently in Pain?  No/denies             [facility] Adult PT Treatment/Exercise - 11/18/18 0001      Neck Exercises: Seated   Neck Retraction  10 reps;3 secs    Neck Retraction Limitations  min cues for form    Other Seated Exercise  bil scap retraction +depressoin x15 reps (improved form noted)    Other Seated  Exercise  thoracic rotations with pvc pipe at chest, bolster at knees 5x5-10" holds each      Manual Therapy   Manual Therapy  Soft tissue mobilization    Manual therapy comments  completed separate rest of treatment    Soft tissue mobilization  STM after needling to L upper trap to reduce pain and restrictions      Neck Exercises: Stretches   Upper Trapezius Stretch  Left;2 reps;30 seconds    Corner Stretch  2 reps;30 seconds      Trigger Point Dry Needling - 11/18/18 1101    Consent Given?  Yes    Education Handout Provided  No    Muscles Treated Upper Body  Upper trapezius    Upper Trapezius Response  Twitch reponse elicited;Palpable increased muscle length   L in prone            PT Education - 11/18/18 1040    Education Details  continue HEP    Person(s) Educated  Patient    Methods  Explanation;Demonstration    Comprehension  Verbalized understanding;Returned demonstration  PT Short Term Goals - 11/05/18 1146      PT SHORT TERM GOAL #1   Title  Pt will be independent with HEP and perform consistently in order to reduce pain.    Time  2    Period  Weeks    Status  New    Target Date  11/19/18      PT SHORT TERM GOAL #2   Title  Pt will have improved bil cervical rotation and cervical extension by 10 deg in order to demo reduced soft tissue restrictions and allow him to drive with greater ease.     Time  2    Period  Weeks    Status  New      PT SHORT TERM GOAL #3   Title  Pt will report being able to sleep without awakening due to neck pain to demo improved function and to maximize his overall recovery.     Time  2    Period  Weeks    Status  New        PT Long Term Goals - 11/05/18 1147      PT LONG TERM GOAL #1   Title  Pt will have improved L cervical rotation to 75deg or > in order to further demo reduced restrictions in L upper trap and decrease his pain with cervical ROM.     Time  4    Period  Weeks    Status  New    Target Date   12/03/18      PT LONG TERM GOAL #2   Title  Pt will be able to perform DNFE test for 60seconds with 0/10 discomfort to demo reduced overall pain.     Time  4    Period  Weeks    Status  New      PT LONG TERM GOAL #3   Title  Pt will report being able to perform OH tasks without neck pain to demo improved functional strength and maximize function at home and work.    Time  4    Period  Weeks    Status  New            Plan - 11/18/18 1112    Clinical Impression Statement  Session limited as pt 85mins late to appointment. Pt reporting overall positive response to needling last session stating that he has had reduce pain overall. Continued with needling this date to further address remaining restrictions in order to reduce pain with cervical movement and overall pain. Good twitch responses elicited throughout L upper trap. Followed up with upper trap stretching, cervical retractions, thoracic mobility, and pec stretching in order to facilitate proper posture and reduce his overall pain. Pt reporting soreness following the needling but reassured pt that this was normal. Continue as planned, progressing as able.     Rehab Potential  Good    PT Frequency  2x / week    PT Duration  4 weeks    PT Treatment/Interventions  ADLs/Self Care Home Management;Cryotherapy;Electrical Stimulation;Moist Heat;Traction;Functional mobility training;Therapeutic activities;Therapeutic exercise;Balance training;Neuromuscular re-education;Patient/family education;Manual techniques;Scar mobilization;Passive range of motion;Dry needling;Energy conservation;Taping;Spinal Manipulations;Joint Manipulations    PT Next Visit Plan  continue dry needling to L upper trap, begin postural strength, thoracic and cervical mobility    PT Home Exercise Plan  eval: upper trap stretch; 1/28: cervical retractions    Consulted and Agree with Plan of Care  Patient       Patient will benefit from  skilled therapeutic intervention in  order to improve the following deficits and impairments:  Decreased range of motion, Hypomobility, Increased fascial restricitons, Increased muscle spasms, Impaired flexibility, Postural dysfunction, Improper body mechanics, Pain, Decreased activity tolerance  Visit Diagnosis: Cervicalgia  Other muscle spasm  Other symptoms and signs involving the musculoskeletal system     Problem List Patient Active Problem List   Diagnosis Date Noted  . Gastroesophageal reflux disease 07/01/2018  . Pyrosis 07/01/2018  . Atypical chest pain 07/01/2018  . History of colonoscopy 07/01/2018  . Hemorrhoids 07/01/2018  . Family history of premature coronary artery disease 12/16/2013  . Hypertension 11/26/2010  . Cancer ([employer]) 07/02/2010  . Hyperlipidemia 11/26/2009        Geraldine Solar PT, DPT   [facility] [address] [place], [place], [address] Phone: [phone]   Fax:  [phone]  Name: LORAINE FREID MRN: [id number] Date of Birth: Sep 03, 1956

## 2018-11-19 DIAGNOSIS — Z8546 Personal history of malignant neoplasm of prostate: Secondary | ICD-10-CM | POA: Diagnosis not present

## 2018-11-19 DIAGNOSIS — C61 Malignant neoplasm of prostate: Secondary | ICD-10-CM | POA: Diagnosis not present

## 2018-11-20 ENCOUNTER — Encounter (HOSPITAL_COMMUNITY): Payer: Self-pay

## 2018-11-20 ENCOUNTER — Ambulatory Visit (HOSPITAL_COMMUNITY): Payer: 59

## 2018-11-20 DIAGNOSIS — M542 Cervicalgia: Secondary | ICD-10-CM | POA: Diagnosis not present

## 2018-11-20 DIAGNOSIS — M62838 Other muscle spasm: Secondary | ICD-10-CM

## 2018-11-20 DIAGNOSIS — R29898 Other symptoms and signs involving the musculoskeletal system: Secondary | ICD-10-CM

## 2018-11-20 NOTE — Therapy (Signed)
[place], [place], [address] Phone: [phone]   Fax:  [phone]  Physical Therapy Treatment  Patient Details  Name: Nathaniel Smith MRN: [id number] Date of Birth: [birth year] Referring Provider (PT): Susy Frizzle, MD   Encounter Date: 11/20/2018  PT End of Session - 11/20/18 0956    Visit Number  4    Number of Visits  9    Date for PT Re-Evaluation  12/03/18    Authorization Type  [employer]    Authorization Time Period  11/05/18 to 12/03/18    Authorization - Visit Number  4    Authorization - Number of Visits  20    PT Start Time  [phone]   pt late   PT Stop Time  1029    PT Time Calculation (min)  33 min    Activity Tolerance  Patient tolerated treatment well    Behavior During Therapy  [facility] for tasks assessed/performed       Past Medical History:  Diagnosis Date  . Cancer ([place]) 07/02/10   prostate  . Hyperlipidemia 11/26/2009  . Hypertension 11/26/2010  . Pyrosis     Past Surgical History:  Procedure Laterality Date  . CARPAL TUNNEL RELEASE Right 10/22/2006   [facility]  . PROSTATE SURGERY  09/2010   seeds implant    There were no vitals filed for this visit.  Subjective Assessment - 11/20/18 0956    Subjective  Pt states that he is feeling pretty good today. Still has some pain but much better overall than it used to be.     Limitations  Lifting    How long can you sit comfortably?  no issues    How long can you stand comfortably?  no issues    How long can you walk comfortably?  no issues    Patient Stated Goals  reduce pain    Currently in Pain?  No/denies                       [facility] Adult PT Treatment/Exercise - 11/20/18 0001      Neck Exercises: Machines for Strengthening   UBE (Upper Arm Bike)  x36mins, L2, retro for postural strengthening      Neck Exercises: Theraband   Shoulder Extension  10 reps;Green    Shoulder Extension Limitations  2 sets     Rows  10 reps;Green    Rows Limitations  2 sets    Other Theraband Exercises  horiz abd GTB 2x10 reps      Neck Exercises: Standing   Other Standing Exercises  Y's on wall with liftoff 2x10       Neck Exercises: Prone   Other Prone Exercise  Y's x8 mins focusing on retraining/engaging lower trap properly      Manual Therapy   Manual Therapy  Soft tissue mobilization    Manual therapy comments  completed separate rest of treatment    Soft tissue mobilization  STM L upper trap to reduce pain and restrictions             PT Education - 11/20/18 0956    Education Details  exercise technique, continue HEP    Person(s) Educated  Patient    Methods  Explanation;Demonstration    Comprehension  Verbalized understanding;Returned demonstration       PT Short Term Goals - 11/05/18 1146      PT SHORT TERM GOAL #1  Title  Pt will be independent with HEP and perform consistently in order to reduce pain.    Time  2    Period  Weeks    Status  New    Target Date  11/19/18      PT SHORT TERM GOAL #2   Title  Pt will have improved bil cervical rotation and cervical extension by 10 deg in order to demo reduced soft tissue restrictions and allow him to drive with greater ease.     Time  2    Period  Weeks    Status  New      PT SHORT TERM GOAL #3   Title  Pt will report being able to sleep without awakening due to neck pain to demo improved function and to maximize his overall recovery.     Time  2    Period  Weeks    Status  New        PT Long Term Goals - 11/05/18 1147      PT LONG TERM GOAL #1   Title  Pt will have improved L cervical rotation to 75deg or > in order to further demo reduced restrictions in L upper trap and decrease his pain with cervical ROM.     Time  4    Period  Weeks    Status  New    Target Date  12/03/18      PT LONG TERM GOAL #2   Title  Pt will be able to perform DNFE test for 60seconds with 0/10 discomfort to demo reduced overall pain.      Time  4    Period  Weeks    Status  New      PT LONG TERM GOAL #3   Title  Pt will report being able to perform OH tasks without neck pain to demo improved functional strength and maximize function at home and work.    Time  4    Period  Weeks    Status  New            Plan - 11/20/18 1030    Clinical Impression Statement  Session limited again as pt late for appointment. Able to begin postural strengthening, scap stabilization, and cervical stabilization today. Min cues for form throughout therex, mainly to reduce upper trap compensation, but no reports of pain during. Spent good deal of time focusing on engaging lower trap and providing education/NMR on how to begin to engage it properly while reducing upper trap compensation. Ended with manual STM to upper traps to reduce restrictions and overall pain. Pt reporting feeling much looser at EOS. Continue as planned, progressing as able.     Rehab Potential  Good    PT Frequency  2x / week    PT Duration  4 weeks    PT Treatment/Interventions  ADLs/Self Care Home Management;Cryotherapy;Electrical Stimulation;Moist Heat;Traction;Functional mobility training;Therapeutic activities;Therapeutic exercise;Balance training;Neuromuscular re-education;Patient/family education;Manual techniques;Scar mobilization;Passive range of motion;Dry needling;Energy conservation;Taping;Spinal Manipulations;Joint Manipulations    PT Next Visit Plan  continue NMR for lower trap; continue dry needling to L upper trap, postural strength, thoracic and cervical mobility    PT Home Exercise Plan  eval: upper trap stretch; 1/28: cervical retractions    Consulted and Agree with Plan of Care  Patient       Patient will benefit from skilled therapeutic intervention in order to improve the following deficits and impairments:  Decreased range of motion, Hypomobility, Increased fascial restricitons, Increased muscle  spasms, Impaired flexibility, Postural dysfunction,  Improper body mechanics, Pain, Decreased activity tolerance  Visit Diagnosis: Cervicalgia  Other muscle spasm  Other symptoms and signs involving the musculoskeletal system     Problem List Patient Active Problem List   Diagnosis Date Noted  . Gastroesophageal reflux disease 07/01/2018  . Pyrosis 07/01/2018  . Atypical chest pain 07/01/2018  . History of colonoscopy 07/01/2018  . Hemorrhoids 07/01/2018  . Family history of premature coronary artery disease 12/16/2013  . Hypertension 11/26/2010  . Cancer ([place]) 07/02/2010  . Hyperlipidemia 11/26/2009        Geraldine Solar PT, DPT   [facility] [address] [place], [place], [address] Phone: [phone]   Fax:  [phone]  Name: MARQUEST GUNKEL MRN: [id number] Date of Birth: 01-10-1956

## 2018-11-25 ENCOUNTER — Ambulatory Visit (HOSPITAL_COMMUNITY): Payer: 59 | Attending: Family Medicine

## 2018-11-25 ENCOUNTER — Encounter (HOSPITAL_COMMUNITY): Payer: Self-pay

## 2018-11-25 DIAGNOSIS — M542 Cervicalgia: Secondary | ICD-10-CM | POA: Diagnosis not present

## 2018-11-25 DIAGNOSIS — R29898 Other symptoms and signs involving the musculoskeletal system: Secondary | ICD-10-CM

## 2018-11-25 DIAGNOSIS — M62838 Other muscle spasm: Secondary | ICD-10-CM | POA: Insufficient documentation

## 2018-11-25 NOTE — Therapy (Signed)
[place], [place], [address] Phone: [phone]   Fax:  [phone]  Physical Therapy Treatment  Patient Details  Name: Nathaniel Smith MRN: [id number] Date of Birth: [birth year] Referring Provider (PT): Susy Frizzle, MD   Encounter Date: 11/25/2018  PT End of Session - 11/25/18 0905    Visit Number  5    Number of Visits  9    Date for PT Re-Evaluation  12/03/18    Authorization Type  [employer]    Authorization Time Period  11/05/18 to 12/03/18    Authorization - Visit Number  5    Authorization - Number of Visits  20    PT Start Time  0903    PT Stop Time  0944    PT Time Calculation (min)  41 min    Activity Tolerance  Patient tolerated treatment well    Behavior During Therapy  [facility] for tasks assessed/performed       Past Medical History:  Diagnosis Date  . Cancer ([place]) 07/02/10   prostate  . Hyperlipidemia 11/26/2009  . Hypertension 11/26/2010  . Pyrosis     Past Surgical History:  Procedure Laterality Date  . CARPAL TUNNEL RELEASE Right 10/22/2006   [facility]  . PROSTATE SURGERY  09/2010   seeds implant    There were no vitals filed for this visit.  Subjective Assessment - 11/25/18 0905    Subjective  Pt states that he is feeling good. He has only very minimal pain. Only minor stiffness with looking down.    Limitations  Lifting    How long can you sit comfortably?  no issues    How long can you stand comfortably?  no issues    How long can you walk comfortably?  no issues    Patient Stated Goals  reduce pain    Currently in Pain?  No/denies           [facility] Adult PT Treatment/Exercise - 11/25/18 0001      Neck Exercises: Standing   Other Standing Exercises  scap depression on wall x51mins      Neck Exercises: Sidelying   Other Sidelying Exercise  scap retraction in sidelying x15 mins total focusing on reducing upper trap compensations      Neck Exercises: Prone    Other Prone Exercise  --      Manual Therapy   Manual Therapy  Soft tissue mobilization    Manual therapy comments  completed separate rest of treatment    Soft tissue mobilization  STM after needling to L upper trap to further reduce pain and restrictions       Trigger Point Dry Needling - 11/25/18 0909    Consent Given?  Yes    Education Handout Provided  No    Muscles Treated Upper Body  Upper trapezius    Upper Trapezius Response  Twitch reponse elicited;Palpable increased muscle length   L in prone          PT Education - 11/25/18 0905    Education Details  exercise technique, continue HEP    Person(s) Educated  Patient    Methods  Explanation;Demonstration    Comprehension  Verbalized understanding;Returned demonstration       PT Short Term Goals - 11/05/18 1146      PT SHORT TERM GOAL #1   Title  Pt will be independent with HEP and perform consistently in order to reduce pain.  Time  2    Period  Weeks    Status  New    Target Date  11/19/18      PT SHORT TERM GOAL #2   Title  Pt will have improved bil cervical rotation and cervical extension by 10 deg in order to demo reduced soft tissue restrictions and allow him to drive with greater ease.     Time  2    Period  Weeks    Status  New      PT SHORT TERM GOAL #3   Title  Pt will report being able to sleep without awakening due to neck pain to demo improved function and to maximize his overall recovery.     Time  2    Period  Weeks    Status  New        PT Long Term Goals - 11/05/18 1147      PT LONG TERM GOAL #1   Title  Pt will have improved L cervical rotation to 75deg or > in order to further demo reduced restrictions in L upper trap and decrease his pain with cervical ROM.     Time  4    Period  Weeks    Status  New    Target Date  12/03/18      PT LONG TERM GOAL #2   Title  Pt will be able to perform DNFE test for 60seconds with 0/10 discomfort to demo reduced overall pain.     Time  4     Period  Weeks    Status  New      PT LONG TERM GOAL #3   Title  Pt will report being able to perform OH tasks without neck pain to demo improved functional strength and maximize function at home and work.    Time  4    Period  Weeks    Status  New            Plan - 11/25/18 0944    Clinical Impression Statement  Pt presenting to therapy reporting overall progress with therapy. He still had minor restrictions in proximal upper trap which recreated his pain. Performed dry needling x3 mins to this area, eliciting good twitch responses throughout. Followed up with manual to the area x12 mins to further reduce pain and restrictions and promote reduced post-needling soreness. Ended with lower trap engagement/recruitment activities in order to reduce upper trap compensation and his overall pain. Pt requiring multimodal cueing to perform but was able to demo understanding by EOS. Added sidelying and standing scap depression to HEP. Continue as planned, progressing as able, potentially d/c'ing next visit if pt requests    Rehab Potential  Good    PT Frequency  2x / week    PT Duration  4 weeks    PT Treatment/Interventions  ADLs/Self Care Home Management;Cryotherapy;Electrical Stimulation;Moist Heat;Traction;Functional mobility training;Therapeutic activities;Therapeutic exercise;Balance training;Neuromuscular re-education;Patient/family education;Manual techniques;Scar mobilization;Passive range of motion;Dry needling;Energy conservation;Taping;Spinal Manipulations;Joint Manipulations    PT Next Visit Plan  potentially d/c per pt request; continue NMR for lower trap; continue dry needling to L upper trap, postural strength, thoracic and cervical mobility    PT Home Exercise Plan  eval: upper trap stretch; 1/28: cervical retractions; 2/4: standing and sidelying lower trap recruitment/scap depression    Consulted and Agree with Plan of Care  Patient       Patient will benefit from skilled  therapeutic intervention in order to improve the following deficits and  impairments:  Decreased range of motion, Hypomobility, Increased fascial restricitons, Increased muscle spasms, Impaired flexibility, Postural dysfunction, Improper body mechanics, Pain, Decreased activity tolerance  Visit Diagnosis: Cervicalgia  Other muscle spasm  Other symptoms and signs involving the musculoskeletal system     Problem List Patient Active Problem List   Diagnosis Date Noted  . Gastroesophageal reflux disease 07/01/2018  . Pyrosis 07/01/2018  . Atypical chest pain 07/01/2018  . History of colonoscopy 07/01/2018  . Hemorrhoids 07/01/2018  . Family history of premature coronary artery disease 12/16/2013  . Hypertension 11/26/2010  . Cancer ([place]) 07/02/2010  . Hyperlipidemia 11/26/2009       Geraldine Solar PT, DPT   [facility] [address] [place], [place], [address] Phone: [phone]   Fax:  [phone]  Name: PHELAN SCHADT MRN: [id number] Date of Birth: December 13, 1955

## 2018-11-27 ENCOUNTER — Ambulatory Visit (INDEPENDENT_AMBULATORY_CARE_PROVIDER_SITE_OTHER): Payer: 59 | Admitting: Family Medicine

## 2018-11-27 ENCOUNTER — Encounter: Payer: Self-pay | Admitting: Family Medicine

## 2018-11-27 ENCOUNTER — Ambulatory Visit (HOSPITAL_COMMUNITY): Payer: 59

## 2018-11-27 ENCOUNTER — Encounter (HOSPITAL_COMMUNITY): Payer: Self-pay

## 2018-11-27 VITALS — BP 128/82 | HR 78 | Temp 98.3°F | Resp 14 | Ht 70.0 in | Wt 205.0 lb

## 2018-11-27 DIAGNOSIS — R29898 Other symptoms and signs involving the musculoskeletal system: Secondary | ICD-10-CM

## 2018-11-27 DIAGNOSIS — E7800 Pure hypercholesterolemia, unspecified: Secondary | ICD-10-CM | POA: Diagnosis not present

## 2018-11-27 DIAGNOSIS — M542 Cervicalgia: Secondary | ICD-10-CM | POA: Diagnosis not present

## 2018-11-27 DIAGNOSIS — I10 Essential (primary) hypertension: Secondary | ICD-10-CM | POA: Diagnosis not present

## 2018-11-27 DIAGNOSIS — Z0000 Encounter for general adult medical examination without abnormal findings: Secondary | ICD-10-CM

## 2018-11-27 DIAGNOSIS — M62838 Other muscle spasm: Secondary | ICD-10-CM

## 2018-11-27 DIAGNOSIS — Z8546 Personal history of malignant neoplasm of prostate: Secondary | ICD-10-CM | POA: Diagnosis not present

## 2018-11-27 LAB — LIPID PANEL
Cholesterol: 191 mg/dL (ref <200)
HDL: 40 mg/dL (ref >=40)
LDL Cholesterol (Calc): 131 mg/dL (calc) — ABNORMAL HIGH
Non-HDL Cholesterol (Calc): 151 mg/dL (calc) — ABNORMAL HIGH (ref <130)
Total CHOL/HDL Ratio: 4.8 (calc) (ref <5.0)
Triglycerides: 97 mg/dL (ref <150)

## 2018-11-27 LAB — COMPLETE METABOLIC PANEL WITH GFR
AG Ratio: 1.7 (calc) (ref 1.0–2.5)
ALBUMIN MSPROF: 4.3 g/dL (ref 3.6–5.1)
ALT: 32 U/L (ref 9–46)
AST: 21 U/L (ref 10–35)
Alkaline phosphatase (APISO): 57 U/L (ref 35–144)
BUN: 9 mg/dL (ref 7–25)
CO2: 26 mmol/L (ref 20–32)
Calcium: 9.8 mg/dL (ref 8.6–10.3)
Chloride: 105 mmol/L (ref 98–110)
Creat: 1.16 mg/dL (ref 0.70–1.25)
GFR, Est African American: 78 mL/min/{1.73_m2} (ref >=60)
GFR, Est Non African American: 67 mL/min/{1.73_m2} (ref >=60)
Globulin: 2.5 g/dL (calc) (ref 1.9–3.7)
Glucose, Bld: 98 mg/dL (ref 65–99)
Potassium: 4.3 mmol/L (ref 3.5–5.3)
SODIUM: 140 mmol/L (ref 135–146)
Total Bilirubin: 0.5 mg/dL (ref 0.2–1.2)
Total Protein: 6.8 g/dL (ref 6.1–8.1)

## 2018-11-27 LAB — CBC WITH DIFFERENTIAL/PLATELET
Absolute Monocytes: 380 cells/uL (ref 200–950)
Basophils Absolute: 28 cells/uL (ref 0–200)
Basophils Relative: 0.5 %
EOS ABS: 72 {cells}/uL (ref 15–500)
Eosinophils Relative: 1.3 %
HEMATOCRIT: 46.1 % (ref 38.5–50.0)
Hemoglobin: 15.7 g/dL (ref 13.2–17.1)
Lymphs Abs: 1991 cells/uL (ref 850–3900)
MCH: 27.9 pg (ref 27.0–33.0)
MCHC: 34.1 g/dL (ref 32.0–36.0)
MCV: 82 fL (ref 80.0–100.0)
MPV: 10.1 fL (ref 7.5–12.5)
Monocytes Relative: 6.9 %
Neutro Abs: 3031 cells/uL (ref 1500–7800)
Neutrophils Relative %: 55.1 %
Platelets: 286 10*3/uL (ref 140–400)
RBC: 5.62 10*6/uL (ref 4.20–5.80)
RDW: 13.4 % (ref 11.0–15.0)
Total Lymphocyte: 36.2 %
WBC: 5.5 10*3/uL (ref 3.8–10.8)

## 2018-11-27 NOTE — Therapy (Signed)
[facility] [address] [place], [place], [address] Phone: [phone]   Fax:  [phone]   PHYSICAL THERAPY DISCHARGE SUMMARY  Visits from Start of Care: 6  Current functional level related to goals / functional outcomes: See below   Remaining deficits: See below   Education / Equipment: HEP  Plan: Patient agrees to discharge.  Patient goals were met. Patient is being discharged due to being pleased with the current functional level.  ?????    Physical Therapy Treatment  Patient Details  Name: Nathaniel Smith MRN: [id number] Date of Birth: [birth year] Referring Provider (PT): Susy Frizzle, MD   Encounter Date: 11/27/2018  PT End of Session - 11/27/18 1031    Visit Number  6    Number of Visits  9    Date for PT Re-Evaluation  12/03/18    Authorization Type  [employer]    Authorization Time Period  11/05/18 to 12/03/18    Authorization - Visit Number  6    Authorization - Number of Visits  20    PT Start Time  4166    PT Stop Time  1051    PT Time Calculation (min)  20 min    Activity Tolerance  Patient tolerated treatment well    Behavior During Therapy  [facility] for tasks assessed/performed       Past Medical History:  Diagnosis Date  . Cancer ([place]) 07/02/10   prostate  . Hyperlipidemia 11/26/2009  . Hypertension 11/26/2010  . Pyrosis     Past Surgical History:  Procedure Laterality Date  . CARPAL TUNNEL RELEASE Right 10/22/2006   [facility]  . PROSTATE SURGERY  09/2010   seeds implant    There were no vitals filed for this visit.  Subjective Assessment - 11/27/18 1031    Subjective  Pt reports that he is feeling great. Very little pain in his neck. He tried to do his new exercises but states it was difficult to do.     Limitations  Lifting    How long can you sit comfortably?  no issues    How long can you stand comfortably?  no issues    How long can you walk comfortably?  no issues    Patient Stated Goals  reduce pain    Currently in Pain?  No/denies         [facility] PT Assessment - 11/27/18 0001      Assessment   Medical Diagnosis  DDD, cervical, strain of neck muscle, initial encounter    Referring Provider (PT)  Susy Frizzle, MD    Onset Date/Surgical Date  --   year ago   Next MD Visit  none scheduled yet    Prior Therapy  none      Other:   Other/ Comments  DNFE test: 60sec, 0/10   was 60sec, 5/10 discomfort     AROM   Cervical Extension  42   was 34   Cervical - Right Rotation  75   was 73   Cervical - Left Rotation  75   was 52deg           PT Education - 11/27/18 1059    Education Details  discharge plans and discharge HEP    Person(s) Educated  Patient    Methods  Explanation;Demonstration;Handout    Comprehension  Verbalized understanding;Returned demonstration         PT Short Term Goals - 11/27/18 1033  PT SHORT TERM GOAL #1   Title  Pt will be independent with HEP and perform consistently in order to reduce pain.    Time  2    Period  Weeks    Status  Achieved      PT SHORT TERM GOAL #2   Title  Pt will have improved bil cervical rotation and cervical extension by 10 deg in order to demo reduced soft tissue restrictions and allow him to drive with greater ease.     Time  2    Period  Weeks    Status  Partially Met      PT SHORT TERM GOAL #3   Title  Pt will report being able to sleep without awakening due to neck pain to demo improved function and to maximize his overall recovery.     Baseline  2/6: not waking up due to neck pain    Time  2    Period  Weeks    Status  Achieved        PT Long Term Goals - 11/27/18 1034      PT LONG TERM GOAL #1   Title  Pt will have improved L cervical rotation to 75deg or > in order to further demo reduced restrictions in L upper trap and decrease his pain with cervical ROM.     Time  4    Period  Weeks    Status  Achieved      PT LONG TERM GOAL #2   Title  Pt will be  able to perform DNFE test for 60seconds with 0/10 discomfort to demo reduced overall pain.     Time  4    Period  Weeks    Status  Achieved      PT LONG TERM GOAL #3   Title  Pt will report being able to perform OH tasks without neck pain to demo improved functional strength and maximize function at home and work.    Baseline  2/6: "hasn't tried"     Time  4    Period  Weeks    Status  On-going            Plan - 11/27/18 1057    Clinical Impression Statement  Pt presents to therapy requesting today be his last day due to progress made. Pt has made great progress towards goals as illustrated above. His cervical AROM has improved in all deficient ranges, his deep neck flexor strength has improved, and his overall pain has significantly reduced since starting therapy. He hasn't tried [facility] tasks with his LUE yet but feels like he could do it with much improved tolerance now. PT educated pt to continue to focus on engaging his lower trap with the HEP already provided and also added more advanced lower trap strengthening exercises today. Lots of education provided for pt to engage his lower trap with the lower level exercises prior to progressing to the exercises provided today and he verbalized understanding. At this time, pt is ready for d/c due to progress made    Rehab Potential  Good    PT Frequency  2x / week    PT Duration  4 weeks    PT Treatment/Interventions  ADLs/Self Care Home Management;Cryotherapy;Electrical Stimulation;Moist Heat;Traction;Functional mobility training;Therapeutic activities;Therapeutic exercise;Balance training;Neuromuscular re-education;Patient/family education;Manual techniques;Scar mobilization;Passive range of motion;Dry needling;Energy conservation;Taping;Spinal Manipulations;Joint Manipulations    PT Next Visit Plan  d/c to HEP    PT Home Exercise Plan  eval: upper trap  stretch; 1/28: cervical retractions; 2/4: standing and sidelying lower trap recruitment/scap  depression; 2/6: lat pull downs/lower trap strength with BTB anchored high, y's on wall, prone y's     Consulted and Agree with Plan of Care  Patient       Patient will benefit from skilled therapeutic intervention in order to improve the following deficits and impairments:  Decreased range of motion, Hypomobility, Increased fascial restricitons, Increased muscle spasms, Impaired flexibility, Postural dysfunction, Improper body mechanics, Pain, Decreased activity tolerance  Visit Diagnosis: Cervicalgia  Other muscle spasm  Other symptoms and signs involving the musculoskeletal system     Problem List Patient Active Problem List   Diagnosis Date Noted  . Gastroesophageal reflux disease 07/01/2018  . Pyrosis 07/01/2018  . Atypical chest pain 07/01/2018  . History of colonoscopy 07/01/2018  . Hemorrhoids 07/01/2018  . Family history of premature coronary artery disease 12/16/2013  . Hypertension 11/26/2010  . Cancer ([place]) 07/02/2010  . Hyperlipidemia 11/26/2009       Geraldine Solar PT, DPT   [facility] [address] [place], [place], [address] Phone: [phone]   Fax:  [phone]  Name: Nathaniel Smith MRN: [id number] Date of Birth: May 09, 1956

## 2018-11-27 NOTE — Progress Notes (Signed)
Subjective:    Patient ID: Nathaniel Smith, male    DOB: 02/15/1956, 63 y.o.   MRN: [id number]  HPI Patient is a very pleasant 63 year old African-American male who is here today for a complete physical exam. Medical history is significant for hypertension, hyperlipidemia, a family history of premature coronary artery disease, and prostate cancer. His prostate is monitored by his urologist due to his history of prostate cancer status post radioactive seed placement.  His urologist is Dr. Karsten Ro.  patient's last colonoscopy was in 2016 at the [facility] and there were benign polyps.  He denies any other medical concerns  Past Medical History:  Diagnosis Date  . Cancer ([place]) 07/02/10   prostate  . Hyperlipidemia 11/26/2009  . Hypertension 11/26/2010  . Pyrosis    Past Surgical History:  Procedure Laterality Date  . CARPAL TUNNEL RELEASE Right 10/22/2006   [facility]  . PROSTATE SURGERY  09/2010   seeds implant   Current Outpatient Medications on File Prior to Visit  Medication Sig Dispense Refill  . amLODipine (NORVASC) 10 MG tablet TAKE 1 TABLET BY MOUTH DAILY - DISCONTINUE 5MG  DOSAGE 90 tablet 3  . atorvastatin (LIPITOR) 10 MG tablet Take 1 tablet (10 mg total) by mouth daily. 90 tablet 1  . cetirizine (ZYRTEC) 10 MG tablet Take 10 mg by mouth daily.    . cyclobenzaprine (FLEXERIL) 10 MG tablet Take 1 tablet (10 mg total) by mouth 3 (three) times daily as needed for muscle spasms. 30 tablet 0  . Ferrous Sulfate (IRON) 28 MG TABS Take by mouth.    . losartan (COZAAR) 100 MG tablet Take 1 tablet (100 mg total) by mouth daily. 90 tablet 1  . Magnesium 500 MG TABS Take by mouth.    . Multiple Vitamin (MULTIVITAMIN) tablet Take 1 tablet by mouth daily.    [name] omeprazole (PRILOSEC) 40 MG capsule Take 1 capsule (40 mg total) by mouth 2 (two) times daily at 8 am and 10 pm. 180 capsule 3   No current facility-administered medications on file prior to visit.    No Known Allergies Social  History   Socioeconomic History  . Marital status: Divorced    Spouse name: Not on file  . Number of children: 1  . Years of education: Not on file  . Highest education level: Not on file  Occupational History  . Not on file  Social Needs  . Financial resource strain: Not on file  . Food insecurity:    Worry: Not on file    Inability: Not on file  . Transportation needs:    Medical: Not on file    Non-medical: Not on file  Tobacco Use  . Smoking status: Former Research scientist (life sciences)  . Smokeless tobacco: Never Used  Substance and Sexual Activity  . Alcohol use: No  . Drug use: No  . Sexual activity: Yes    Birth control/protection: Condom  Lifestyle  . Physical activity:    Days per week: Not on file    Minutes per session: Not on file  . Stress: Not on file  Relationships  . Social connections:    Talks on phone: Not on file    Gets together: Not on file    Attends religious service: Not on file    Active member of club or organization: Not on file    Attends meetings of clubs or organizations: Not on file    Relationship status: Not on file  . Intimate partner violence:  Fear of current or ex partner: Not on file    Emotionally abused: Not on file    Physically abused: Not on file    Forced sexual activity: Not on file  Other Topics Concern  . Not on file  Social History Narrative   He drives dump trucks. Hauls rock, dirt, etc.   Divorced. Has one son and one stepdaughter.   :Walks on  [place]readmill mill for 1 mile every morning Monday through Friday.   He smoked 1/2 pack a day starting at age 73 and quit at age 29.--smoked for about 26 years.    Has Smoked none for 13 years.   Family History  Problem Relation Age of Onset  . Diabetes Father   . Heart disease Father   . Heart disease Brother 54       Stent at 54--CAD  . Colon cancer Maternal Uncle   . Heart disease Paternal Aunt   . Prostate cancer Paternal Uncle   . Heart disease Paternal Uncle   . Esophageal cancer  Neg Hx   . Inflammatory bowel disease Neg Hx   . Liver disease Neg Hx   . Pancreatic cancer Neg Hx   . Rectal cancer Neg Hx   . Stomach cancer Neg Hx       Review of Systems  All other systems reviewed and are negative.      Objective:   Physical Exam  Constitutional: He is oriented to person, place, and time. He appears well-developed and well-nourished. No distress.  HENT:  Head: Normocephalic and atraumatic.  Right Ear: External ear normal.  Left Ear: External ear normal.  Nose: Nose normal.  Mouth/Throat: Oropharynx is clear and moist. No oropharyngeal exudate.  Eyes: Pupils are equal, round, and reactive to light. Conjunctivae and EOM are normal. Right eye exhibits no discharge. Left eye exhibits no discharge. No scleral icterus.  Neck: Normal range of motion. Neck supple. No JVD present. No tracheal deviation present. No thyromegaly present.  Cardiovascular: Normal rate, regular rhythm, normal heart sounds and intact distal pulses. Exam reveals no gallop and no friction rub.  No murmur heard. Pulmonary/Chest: Effort normal and breath sounds normal. No stridor. No respiratory distress. He has no wheezes. He has no rales. He exhibits no tenderness.  Abdominal: Soft. Bowel sounds are normal. He exhibits no distension and no mass. There is no abdominal tenderness. There is no rebound and no guarding.  Musculoskeletal: Normal range of motion.        General: No tenderness or edema.  Lymphadenopathy:    He has no cervical adenopathy.  Neurological: He is alert and oriented to person, place, and time. He has normal reflexes. No cranial nerve deficit. He exhibits normal muscle tone. Coordination normal.  Skin: Skin is warm. No rash noted. He is not diaphoretic. No erythema. No pallor.  Psychiatric: He has a normal mood and affect. His behavior is normal. Judgment and thought content normal.  Vitals reviewed.         Assessment & Plan:  Routine general medical examination at  a health care facility  Benign essential HTN - Plan: CBC with Differential/Platelet, COMPLETE METABOLIC PANEL WITH GFR, Lipid panel  Pure hypercholesterolemia - Plan: CBC with Differential/Platelet, COMPLETE METABOLIC PANEL WITH GFR, Lipid panel   Patient's physical exam today is completely normal.  His flu shot is up-to-date.  I recommended Shingrix/the shingles vaccine at a local pharmacy.  I will check a CBC, CMP, fasting lipid panel.  His PSA is  monitored by his urologist and he has an appointment to see his urologist later today.  His colonoscopy will be due in 2021.  Follow-up in 1 year or as needed.

## 2018-12-01 ENCOUNTER — Other Ambulatory Visit: Payer: Self-pay | Admitting: Family Medicine

## 2018-12-01 MED ORDER — OMEPRAZOLE 40 MG PO CPDR: 40.0000 mg | DELAYED_RELEASE_CAPSULE | Freq: Two times a day (BID) | ORAL | 3 refills | Status: DC

## 2018-12-01 MED ORDER — LOSARTAN POTASSIUM 100 MG PO TABS: 100.0000 mg | ORAL_TABLET | Freq: Every day | ORAL | 1 refills | Status: DC

## 2018-12-01 MED ORDER — AMLODIPINE BESYLATE 10 MG PO TABS: ORAL_TABLET | ORAL | 3 refills | Status: DC

## 2018-12-01 MED ORDER — ATORVASTATIN CALCIUM 20 MG PO TABS: 20.0000 mg | ORAL_TABLET | Freq: Every day | ORAL | 3 refills | Status: DC

## 2018-12-02 ENCOUNTER — Encounter (HOSPITAL_COMMUNITY): Payer: 59

## 2018-12-04 ENCOUNTER — Telehealth: Payer: Self-pay | Admitting: Family Medicine

## 2018-12-04 MED ORDER — OMEPRAZOLE 40 MG PO CPDR: 40.0000 mg | DELAYED_RELEASE_CAPSULE | Freq: Two times a day (BID) | ORAL | 3 refills | Status: DC

## 2018-12-04 NOTE — Telephone Encounter (Signed)
PA Submitted through [email or web address] and received the following:  Request Reference Number: [id number]. OMEPRAZOLE CAP 40MG  is approved through 12/05/2019. For further questions, call [phone].  Pharm made aware

## 2019-03-10 ENCOUNTER — Encounter: Payer: Self-pay | Admitting: Family Medicine

## 2019-03-12 ENCOUNTER — Encounter: Payer: Self-pay | Admitting: Family Medicine

## 2019-03-12 ENCOUNTER — Ambulatory Visit: Payer: 59 | Admitting: Family Medicine

## 2019-03-12 ENCOUNTER — Other Ambulatory Visit: Payer: Self-pay

## 2019-03-12 VITALS — BP 126/78 | HR 76 | Temp 98.2°F | Resp 14 | Ht 70.0 in | Wt 207.0 lb

## 2019-03-12 DIAGNOSIS — R0989 Other specified symptoms and signs involving the circulatory and respiratory systems: Secondary | ICD-10-CM | POA: Diagnosis not present

## 2019-03-12 MED ORDER — CLONAZEPAM 1 MG PO TABS: 1.0000 mg | ORAL_TABLET | Freq: Every day | ORAL | 1 refills | Status: DC

## 2019-03-12 NOTE — Progress Notes (Signed)
Subjective:    Patient ID: Nathaniel Smith, male    DOB: 03-15-1956, 63 y.o.   MRN: [id number]  Gastroesophageal Reflux   GI Problem   Significant associated medical issues include GERD.  04/10/18 Patient states that over the last 3 weeks, he has had numerous episodes where he will feel acid coming up into his chest.  He will have wet burps with a salty bitter acidic fluid in his mouth.  When he lays down at night, he can often feel the acid coming up in his chest.  One episode he woke up gasping for breath with the acid in his throat.  He is associated this with whenever he drinks an excessive amount of coffee.  He drinks over 32 ounces of coffee a day.  He denies any melena or hematochezia.  He denies any chest pain or shortness of breath or dyspnea on exertion.  He denies any hematemesis.  He denies any abdominal pain or weight loss.  At that time, my plan was: Begin Dexilant 60 mg p.o. every morning with breakfast.  Elevate the head of the bed 2 inches.  Decrease consumption of acidic foods.  Discontinue coffee or reduce consumption and switch to decaf.  Reassess in 2 weeks to see if symptoms are continuing.  Reassess sooner if symptoms worsen  04/28/18 Patient states that the dexilant has helped his symptoms somewhat.  He is no longer being awakened from sleep with reflux.  However he continues to have spells almost every other day where he will develop a severe burning sensation in the center of his chest usually immediately after eating there will be moderate to severe in intensity.  If he uses Tums, the symptoms will gradually subside over approximately 1 hour.  He denies any melena or hematochezia.  He denies any weight loss nausea or vomiting.  He denies any angina or shortness of breath.  At that time, my plan was: Patient symptoms have improved slightly however they continue to persist.  Insurance will not cover [place].  Therefore we will replace this with Protonix 40 mg a day in the  morning with food and supplement with Zantac 300 mg p.o. nightly.  Allow 2 to 3 weeks to take full effect and if no improvement is seen, I will consult GI for possible EGD to rule out other causes of refractory reflux   03/12/19 Patient saw GI.  EGD was unremarkable.  He was switched to omeprazole 40 mg twice daily.  Patient has been taking the medication twice daily however he will still often wake up usually around 1 or 3 in the morning with a feeling of burning in his lower neck upper chest area.  He will feel like he cannot breathe like his throat is closing in.  Seems to be worse when he eats certain foods such as fried food or ice cream.  Also seems to be worse if he does not take his nighttime omeprazole.  However despite doing the omeprazole twice daily and despite trying to modify his diet, he continues to have breakthrough symptoms.  This always occurs at night.  He is only have 1 occurrence during the daytime.  It occurs usually while he is sitting in a recliner.  It does not happen when he is supine.  He reports a sensation of something in his throat and a sensation like he cannot breathe.  If he gets up and drink some water the sensation will gradually go away after a few minutes  Past Medical History:  Diagnosis Date  . Cancer ([place]) 07/02/10   prostate  . Hyperlipidemia 11/26/2009  . Hypertension 11/26/2010  . Pyrosis    Past Surgical History:  Procedure Laterality Date  . CARPAL TUNNEL RELEASE Right 10/22/2006   [facility]  . PROSTATE SURGERY  09/2010   seeds implant   Current Outpatient Medications on File Prior to Visit  Medication Sig Dispense Refill  . amLODipine (NORVASC) 10 MG tablet TAKE 1 TABLET BY MOUTH DAILY 90 tablet 3  . atorvastatin (LIPITOR) 20 MG tablet Take 1 tablet (20 mg total) by mouth daily. 90 tablet 3  . cetirizine (ZYRTEC) 10 MG tablet Take 10 mg by mouth daily.    . cyclobenzaprine (FLEXERIL) 10 MG tablet Take 1 tablet (10 mg total) by mouth 3 (three)  times daily as needed for muscle spasms. 30 tablet 0  . Ferrous Sulfate (IRON) 28 MG TABS Take by mouth.    . losartan (COZAAR) 100 MG tablet Take 1 tablet (100 mg total) by mouth daily. 90 tablet 1  . Magnesium 500 MG TABS Take by mouth.    . Multiple Vitamin (MULTIVITAMIN) tablet Take 1 tablet by mouth daily.    [name] omeprazole (PRILOSEC) 40 MG capsule Take 1 capsule (40 mg total) by mouth 2 (two) times daily at 8 am and 10 pm. 180 capsule 3   No current facility-administered medications on file prior to visit.    No Known Allergies Social History   Socioeconomic History  . Marital status: Divorced    Spouse name: Not on file  . Number of children: 1  . Years of education: Not on file  . Highest education level: Not on file  Occupational History  . Not on file  Social Needs  . Financial resource strain: Not on file  . Food insecurity:    Worry: Not on file    Inability: Not on file  . Transportation needs:    Medical: Not on file    Non-medical: Not on file  Tobacco Use  . Smoking status: Former Research scientist (life sciences)  . Smokeless tobacco: Never Used  Substance and Sexual Activity  . Alcohol use: No  . Drug use: No  . Sexual activity: Yes    Birth control/protection: Condom  Lifestyle  . Physical activity:    Days per week: Not on file    Minutes per session: Not on file  . Stress: Not on file  Relationships  . Social connections:    Talks on phone: Not on file    Gets together: Not on file    Attends religious service: Not on file    Active member of club or organization: Not on file    Attends meetings of clubs or organizations: Not on file    Relationship status: Not on file  . Intimate partner violence:    Fear of current or ex partner: Not on file    Emotionally abused: Not on file    Physically abused: Not on file    Forced sexual activity: Not on file  Other Topics Concern  . Not on file  Social History Narrative   He drives dump trucks. Hauls rock, dirt, etc.    Divorced. Has one son and one stepdaughter.   :[place]  [place] 1 mile every morning Monday through Friday.   He smoked 1/2 pack a day starting at age 36 and quit at age 83.--smoked for about 26 years.    Has Smoked none for  13 years.   Family History  Problem Relation Age of Onset  . Diabetes Father   . Heart disease Father   . Heart disease Brother 54       Stent at 54--CAD  . Colon cancer Maternal Uncle   . Heart disease Paternal Aunt   . Prostate cancer Paternal Uncle   . Heart disease Paternal Uncle   . Esophageal cancer Neg Hx   . Inflammatory bowel disease Neg Hx   . Liver disease Neg Hx   . Pancreatic cancer Neg Hx   . Rectal cancer Neg Hx   . Stomach cancer Neg Hx       Review of Systems  All other systems reviewed and are negative.      Objective:   Physical Exam  Constitutional: He appears well-developed and well-nourished. No distress.  HENT:  Head: Normocephalic and atraumatic.  Right Ear: External ear normal.  Left Ear: External ear normal.  Nose: Nose normal.  Mouth/Throat: Oropharynx is clear and moist. No oropharyngeal exudate.  Eyes: Pupils are equal, round, and reactive to light. Conjunctivae and EOM are normal. Right eye exhibits no discharge. Left eye exhibits no discharge. No scleral icterus.  Neck: Normal range of motion. Neck supple. No JVD present. No tracheal deviation present. No thyromegaly present.  Cardiovascular: Normal rate, regular rhythm, normal heart sounds and intact distal pulses. Exam reveals no gallop and no friction rub.  No murmur heard. Pulmonary/Chest: Effort normal and breath sounds normal. No stridor. No respiratory distress. He has no wheezes. He has no rales. He exhibits no tenderness.  Abdominal: Soft. Bowel sounds are normal. He exhibits no distension and no mass. There is no abdominal tenderness. There is no rebound and no guarding.  Lymphadenopathy:    He has no cervical adenopathy.  Skin: He is not  diaphoretic.  Vitals reviewed.         Assessment & Plan:  Globus sensation Spent more than 20 minutes today with the patient reviewing his past medical history and discussing his symptoms.  I feel that some of this is anxiety related given the diurnal variation in the consistent pattern and the way his symptoms occur.  I do believe acid is the trigger however I believe some of this is anxiety related.  Therefore I recommended trying Klonopin 1 mg p.o. nightly prior to bedtime to see if we can reduce the frequency and incidence of this.  If so I think this would lend credence to the theory some of this may be anxiety related globus sensation.  This would give [place] a better idea of how to move forward treating this in the future rather than focusing simply on reflux.

## 2019-05-05 ENCOUNTER — Other Ambulatory Visit: Payer: Self-pay | Admitting: *Deleted

## 2019-05-05 MED ORDER — ATORVASTATIN CALCIUM 20 MG PO TABS: 20.0000 mg | ORAL_TABLET | Freq: Every day | ORAL | 3 refills | Status: DC

## 2019-05-07 ENCOUNTER — Encounter: Payer: Self-pay | Admitting: Family Medicine

## 2019-05-14 ENCOUNTER — Encounter: Payer: Self-pay | Admitting: Family Medicine

## 2019-05-14 MED ORDER — ATORVASTATIN CALCIUM 20 MG PO TABS: 20.0000 mg | ORAL_TABLET | Freq: Every day | ORAL | 3 refills | Status: DC

## 2019-05-21 MED ORDER — LOSARTAN POTASSIUM 100 MG PO TABS: 100.0000 mg | ORAL_TABLET | Freq: Every day | ORAL | 3 refills | Status: DC

## 2019-05-21 NOTE — Addendum Note (Signed)
Addended by: Shary Decamp B on: 05/21/2019 08:00 AM   Modules accepted: Orders

## 2019-06-29 ENCOUNTER — Encounter: Payer: Self-pay | Admitting: Family Medicine

## 2019-07-03 ENCOUNTER — Other Ambulatory Visit: Payer: Self-pay

## 2019-07-03 ENCOUNTER — Ambulatory Visit: Payer: 59 | Admitting: Family Medicine

## 2019-07-03 ENCOUNTER — Encounter: Payer: Self-pay | Admitting: Family Medicine

## 2019-07-03 VITALS — BP 136/80 | HR 80 | Temp 98.6°F | Resp 18 | Ht 70.0 in | Wt 210.0 lb

## 2019-07-03 DIAGNOSIS — B360 Pityriasis versicolor: Secondary | ICD-10-CM

## 2019-07-03 DIAGNOSIS — Z23 Encounter for immunization: Secondary | ICD-10-CM | POA: Diagnosis not present

## 2019-07-03 MED ORDER — TERBINAFINE HCL 250 MG PO TABS: 250.0000 mg | ORAL_TABLET | ORAL | 1 refills | Status: DC

## 2019-07-03 MED ORDER — CLONAZEPAM 1 MG PO TABS: 1.0000 mg | ORAL_TABLET | Freq: Every day | ORAL | 1 refills | Status: DC

## 2019-07-03 NOTE — Addendum Note (Signed)
Addended by: Shary Decamp B on: 07/03/2019 04:19 PM   Modules accepted: Orders

## 2019-07-03 NOTE — Progress Notes (Signed)
Subjective:    Patient ID: Nathaniel Smith, male    DOB: Jul 12, 1956, 63 y.o.   MRN: [id number]  GI Problem  Significant associated medical issues include GERD.  Gastroesophageal Reflux  Medication Refill  04/10/18 Patient states that over the last 3 weeks, he has had numerous episodes where he will feel acid coming up into his chest.  He will have wet burps with a salty bitter acidic fluid in his mouth.  When he lays down at night, he can often feel the acid coming up in his chest.  One episode he woke up gasping for breath with the acid in his throat.  He is associated this with whenever he drinks an excessive amount of coffee.  He drinks over 32 ounces of coffee a day.  He denies any melena or hematochezia.  He denies any chest pain or shortness of breath or dyspnea on exertion.  He denies any hematemesis.  He denies any abdominal pain or weight loss.  At that time, my plan was: Begin Dexilant 60 mg p.o. every morning with breakfast.  Elevate the head of the bed 2 inches.  Decrease consumption of acidic foods.  Discontinue coffee or reduce consumption and switch to decaf.  Reassess in 2 weeks to see if symptoms are continuing.  Reassess sooner if symptoms worsen  04/28/18 Patient states that the dexilant has helped his symptoms somewhat.  He is no longer being awakened from sleep with reflux.  However he continues to have spells almost every other day where he will develop a severe burning sensation in the center of his chest usually immediately after eating there will be moderate to severe in intensity.  If he uses Tums, the symptoms will gradually subside over approximately 1 hour.  He denies any melena or hematochezia.  He denies any weight loss nausea or vomiting.  He denies any angina or shortness of breath.  At that time, my plan was: Patient symptoms have improved slightly however they continue to persist.  Insurance will not cover [place].  Therefore we will replace this with Protonix 40  mg a day in the morning with food and supplement with Zantac 300 mg p.o. nightly.  Allow 2 to 3 weeks to take full effect and if no improvement is seen, I will consult GI for possible EGD to rule out other causes of refractory reflux   03/12/19 Patient saw GI.  EGD was unremarkable.  He was switched to omeprazole 40 mg twice daily.  Patient has been taking the medication twice daily however he will still often wake up usually around 1 or 3 in the morning with a feeling of burning in his lower neck upper chest area.  He will feel like he cannot breathe like his throat is closing in.  Seems to be worse when he eats certain foods such as fried food or ice cream.  Also seems to be worse if he does not take his nighttime omeprazole.  However despite doing the omeprazole twice daily and despite trying to modify his diet, he continues to have breakthrough symptoms.  This always occurs at night.  He is only have 1 occurrence during the daytime.  It occurs usually while he is sitting in a recliner.  It does not happen when he is supine.  He reports a sensation of something in his throat and a sensation like he cannot breathe.  If he gets up and drink some water the sensation will gradually go away after a few  minutes.  At that time, my plan was: Spent more than 20 minutes today with the patient reviewing his past medical history and discussing his symptoms.  I feel that some of this is anxiety related given the diurnal variation in the consistent pattern and the way his symptoms occur.  I do believe acid is the trigger however I believe some of this is anxiety related.  Therefore I recommended trying Klonopin 1 mg p.o. nightly prior to bedtime to see if we can reduce the frequency and incidence of this.  If so I think this would lend credence to the theory some of this may be anxiety related globus sensation.  This would give [place] a better idea of how to move forward treating this in the future rather than focusing simply  on reflux.  07/03/19 Patient is here today requesting a refill on his Klonopin.  He states that the medication has helped him.  Since starting the Klonopin, he has not had any further "GI issues".  He uses the medication sparingly.  He may take 2/week if that.  The medicine helps him sleep and certainly calms down anxiety when it happens.  As the patient states, "I just like to have it in the medicine cabinet in case I need it".  His primary reason for coming today is a rash.  It is on the right side of his neck and in the center of his back.  Is difficult to see on his neck but the photo below shows a clearly on his back.  His brother had a similar rash recently that I treated with Lamisil for tinea versicolor and the patient would like to try the medication as well.      Past Medical History:  Diagnosis Date   Cancer ([place]) 07/02/10   prostate   Hyperlipidemia 11/26/2009   Hypertension 11/26/2010   Pyrosis    Past Surgical History:  Procedure Laterality Date   CARPAL TUNNEL RELEASE Right 10/22/2006   [facility]   PROSTATE SURGERY  09/2010   seeds implant   Current Outpatient Medications on File Prior to Visit  Medication Sig Dispense Refill   amLODipine (NORVASC) 10 MG tablet TAKE 1 TABLET BY MOUTH DAILY 90 tablet 3   atorvastatin (LIPITOR) 20 MG tablet Take 1 tablet (20 mg total) by mouth daily. 90 tablet 3   cetirizine (ZYRTEC) 10 MG tablet Take 10 mg by mouth daily.     cyclobenzaprine (FLEXERIL) 10 MG tablet Take 1 tablet (10 mg total) by mouth 3 (three) times daily as needed for muscle spasms. 30 tablet 0   Ferrous Sulfate (IRON) 28 MG TABS Take by mouth.     losartan (COZAAR) 100 MG tablet Take 1 tablet (100 mg total) by mouth daily. 90 tablet 3   Magnesium 500 MG TABS Take by mouth.     Multiple Vitamin (MULTIVITAMIN) tablet Take 1 tablet by mouth daily.     omeprazole (PRILOSEC) 40 MG capsule Take 1 capsule (40 mg total) by mouth 2 (two) times daily at 8  am and 10 pm. 180 capsule 3   No current facility-administered medications on file prior to visit.    No Known Allergies Social History   Socioeconomic History   Marital status: Divorced    Spouse name: Not on file   Number of children: 1   Years of education: Not on file   Highest education level: Not on file  Occupational History   Not on file  Social Needs  Financial resource strain: Not on file   Food insecurity    Worry: Not on file    Inability: Not on file   Transportation needs    Medical: Not on file    Non-medical: Not on file  Tobacco Use   Smoking status: Former Smoker   Smokeless tobacco: Never Used  Substance and Sexual Activity   Alcohol use: No   Drug use: No   Sexual activity: Yes    Birth control/protection: Condom  Lifestyle   Physical activity    Days per week: Not on file    Minutes per session: Not on file   Stress: Not on file  Relationships   Social connections    Talks on phone: Not on file    Gets together: Not on file    Attends religious service: Not on file    Active member of club or organization: Not on file    Attends meetings of clubs or organizations: Not on file    Relationship status: Not on file   Intimate partner violence    Fear of current or ex partner: Not on file    Emotionally abused: Not on file    Physically abused: Not on file    Forced sexual activity: Not on file  Other Topics Concern   Not on file  Social History Narrative   He drives dump trucks. Hauls rock, dirt, etc.   Divorced. Has one son and one stepdaughter.   :Walks on  [place] for 1 mile every morning Monday through Friday.   He smoked 1/2 pack a day starting at age 36 and quit at age 51.--smoked for about 26 years.    Has Smoked none for 13 years.   Family History  Problem Relation Age of Onset   Diabetes Father    Heart disease Father    Heart disease Brother 66       Stent at 54--CAD   Colon cancer Maternal Uncle     Heart disease Paternal Aunt    Prostate cancer Paternal Uncle    Heart disease Paternal Uncle    Esophageal cancer Neg Hx    Inflammatory bowel disease Neg Hx    Liver disease Neg Hx    Pancreatic cancer Neg Hx    Rectal cancer Neg Hx    Stomach cancer Neg Hx       Review of Systems  All other systems reviewed and are negative.      Objective:   Physical Exam  Constitutional: He appears well-developed and well-nourished. No distress.  Eyes: EOM are normal.  Cardiovascular: Normal rate, regular rhythm, normal heart sounds and intact distal pulses. Exam reveals no gallop and no friction rub.  No murmur heard. Pulmonary/Chest: Effort normal and breath sounds normal. No respiratory distress. He has no wheezes. He has no rales. He exhibits no tenderness.  Skin: Rash noted. He is not diaphoretic.  Vitals reviewed.         Assessment & Plan:  Tinea versicolor  I will treat the patient with Lamisil 250 mg p.o. x1.  He can repeat this in 1 week.  I explained the natural history of this rash and the benign nature of it.  I will refill his Klonopin which he is using sparingly for anxiety which was contributing to his globus sensation.

## 2019-11-27 ENCOUNTER — Encounter: Payer: Self-pay | Admitting: Family Medicine

## 2019-12-05 ENCOUNTER — Ambulatory Visit: Payer: 59 | Attending: Internal Medicine

## 2019-12-05 ENCOUNTER — Ambulatory Visit: Payer: 59

## 2019-12-05 ENCOUNTER — Other Ambulatory Visit: Payer: Self-pay

## 2019-12-05 DIAGNOSIS — Z23 Encounter for immunization: Secondary | ICD-10-CM

## 2019-12-05 NOTE — Progress Notes (Signed)
   Covid-19 Vaccination Clinic  Name:  PER NOTARO    MRN: [id number] DOB: 1956-05-20  12/05/2019  Mr. Yarman was observed post Covid-19 immunization for 15 minutes without incidence. He was provided with Vaccine Information Sheet and instruction to access the V-Safe system.   Mr. Swinea was instructed to call 911 with any severe reactions post vaccine: [name] Difficulty breathing  . Swelling of your face and throat  . A fast heartbeat  . A bad rash all over your body  . Dizziness and weakness    Immunizations Administered    Name Date Dose VIS Date Route   Moderna COVID-19 Vaccine 12/05/2019  2:25 PM 0.5 mL 09/22/2019 Intramuscular   Manufacturer: Moderna   Lot: [id number]   [id number]

## 2019-12-21 ENCOUNTER — Other Ambulatory Visit: Payer: Self-pay

## 2019-12-21 ENCOUNTER — Ambulatory Visit (INDEPENDENT_AMBULATORY_CARE_PROVIDER_SITE_OTHER): Payer: 59 | Admitting: Family Medicine

## 2019-12-21 VITALS — BP 136/90 | HR 72 | Temp 96.9°F | Resp 16 | Ht 70.0 in | Wt 210.0 lb

## 2019-12-21 DIAGNOSIS — K219 Gastro-esophageal reflux disease without esophagitis: Secondary | ICD-10-CM

## 2019-12-21 DIAGNOSIS — Z0000 Encounter for general adult medical examination without abnormal findings: Secondary | ICD-10-CM

## 2019-12-21 DIAGNOSIS — E7800 Pure hypercholesterolemia, unspecified: Secondary | ICD-10-CM | POA: Diagnosis not present

## 2019-12-21 DIAGNOSIS — D126 Benign neoplasm of colon, unspecified: Secondary | ICD-10-CM

## 2019-12-21 DIAGNOSIS — I10 Essential (primary) hypertension: Secondary | ICD-10-CM

## 2019-12-21 NOTE — Progress Notes (Signed)
Subjective:    Patient ID: Nathaniel Smith, male    DOB: June 20, 1956, 64 y.o.   MRN: [id number]  HPI Patient is a very pleasant 64 year old African-American male who is here today for a complete physical exam. Medical history is significant for hypertension, hyperlipidemia, a family history of premature coronary artery disease, and prostate cancer. His prostate is monitored by his urologist due to his history of prostate cancer status post radioactive seed placement.  His urologist is Dr. Karsten Ro.  patient's last colonoscopy was in 2016 at the [place]. review of the biopsy taken at the [place] does show that he had a tubular adenoma.  Therefore he would be due for repeat colonoscopy this year.  He denies any melena or hematochezia.  He does occasionally still experience heartburn.  Is not on a daily basis.  Some of it is related to stress as it does improve when he takes anxiety medicine.  Some is related to dietary indiscretion.  He would like a refill on his anxiety medication that he uses sparingly.  He only took 1 tablet the entire month of February.  Otherwise he is doing well with no concerns.  His urologist states that he has released him because he is doing so well.  Therefore he is due for his next PSA and rectal exam in February 2022.  Past Medical History:  Diagnosis Date  . Cancer ([place]) 07/02/10   prostate  . Hyperlipidemia 11/26/2009  . Hypertension 11/26/2010  . Pyrosis    Past Surgical History:  Procedure Laterality Date  . CARPAL TUNNEL RELEASE Right 10/22/2006   [facility]  . PROSTATE SURGERY  09/2010   seeds implant   Current Outpatient Medications on File Prior to Visit  Medication Sig Dispense Refill  . amLODipine (NORVASC) 10 MG tablet TAKE 1 TABLET BY MOUTH DAILY 90 tablet 3  . atorvastatin (LIPITOR) 20 MG tablet Take 1 tablet (20 mg total) by mouth daily. 90 tablet 3  . cetirizine (ZYRTEC) 10 MG tablet Take 10 mg by mouth daily.    . cyclobenzaprine (FLEXERIL) 10 MG  tablet Take 1 tablet (10 mg total) by mouth 3 (three) times daily as needed for muscle spasms. 30 tablet 0  . Ferrous Sulfate (IRON) 28 MG TABS Take by mouth.    . losartan (COZAAR) 100 MG tablet Take 1 tablet (100 mg total) by mouth daily. 90 tablet 3  . Magnesium 500 MG TABS Take by mouth.    . Multiple Vitamin (MULTIVITAMIN) tablet Take 1 tablet by mouth daily.    [name] omeprazole (PRILOSEC) 40 MG capsule Take 1 capsule (40 mg total) by mouth 2 (two) times daily at 8 am and 10 pm. 180 capsule 3   No current facility-administered medications on file prior to visit.   No Known Allergies Social History   Socioeconomic History  . Marital status: Married    Spouse name: Not on file  . Number of children: 1  . Years of education: Not on file  . Highest education level: Not on file  Occupational History  . Not on file  Tobacco Use  . Smoking status: Former Research scientist (life sciences)  . Smokeless tobacco: Never Used  Substance and Sexual Activity  . Alcohol use: No  . Drug use: No  . Sexual activity: Yes    Birth control/protection: Condom  Other Topics Concern  . Not on file  Social History Narrative   He drives dump trucks. Hauls rock, dirt, etc.   Divorced. Has one son  and one stepdaughter.   :Walks on  [place] for 1 mile every morning Monday through Friday.   He smoked 1/2 pack a day starting at age 56 and quit at age 29.--smoked for about 26 years.    Has Smoked none for 13 years.   Social Determinants of Health   Financial Resource Strain:   . Difficulty of Paying Living Expenses: Not on file  Food Insecurity:   . Worried About Charity fundraiser in the Last Year: Not on file  . Ran Out of Food in the Last Year: Not on file  Transportation Needs:   . Lack of Transportation (Medical): Not on file  . Lack of Transportation (Non-Medical): Not on file  Physical Activity:   . Days of Exercise per Week: Not on file  . Minutes of Exercise per Session: Not on file  Stress:   . Feeling of  Stress : Not on file  Social Connections:   . Frequency of Communication with Friends and Family: Not on file  . Frequency of Social Gatherings with Friends and Family: Not on file  . Attends Religious Services: Not on file  . Active Member of Clubs or Organizations: Not on file  . Attends Archivist Meetings: Not on file  . Marital Status: Not on file  Intimate Partner Violence:   . Fear of Current or Ex-Partner: Not on file  . Emotionally Abused: Not on file  . Physically Abused: Not on file  . Sexually Abused: Not on file   Family History  Problem Relation Age of Onset  . Diabetes Father   . Heart disease Father   . Heart disease Brother 54       Stent at 54--CAD  . Colon cancer Maternal Uncle   . Heart disease Paternal Aunt   . Prostate cancer Paternal Uncle   . Heart disease Paternal Uncle   . Esophageal cancer Neg Hx   . Inflammatory bowel disease Neg Hx   . Liver disease Neg Hx   . Pancreatic cancer Neg Hx   . Rectal cancer Neg Hx   . Stomach cancer Neg Hx       Review of Systems  All other systems reviewed and are negative.      Objective:   Physical Exam  Constitutional: He is oriented to person, place, and time. He appears well-developed and well-nourished. No distress.  HENT:  Head: Normocephalic and atraumatic.  Right Ear: External ear normal.  Left Ear: External ear normal.  Nose: Nose normal.  Mouth/Throat: Oropharynx is clear and moist. No oropharyngeal exudate.  Eyes: Pupils are equal, round, and reactive to light. Conjunctivae and EOM are normal. Right eye exhibits no discharge. Left eye exhibits no discharge. No scleral icterus.  Neck: No JVD present. No tracheal deviation present. No thyromegaly present.  Cardiovascular: Normal rate, regular rhythm, normal heart sounds and intact distal pulses. Exam reveals no gallop and no friction rub.  No murmur heard. Pulmonary/Chest: Effort normal and breath sounds normal. No stridor. No  respiratory distress. He has no wheezes. He has no rales. He exhibits no tenderness.  Abdominal: Soft. Bowel sounds are normal. He exhibits no distension and no mass. There is no abdominal tenderness. There is no rebound and no guarding.  Musculoskeletal:        General: No tenderness or edema. Normal range of motion.     Cervical back: Normal range of motion and neck supple.  Lymphadenopathy:    He has no  cervical adenopathy.  Neurological: He is alert and oriented to person, place, and time. He has normal reflexes. No cranial nerve deficit. He exhibits normal muscle tone. Coordination normal.  Skin: Skin is warm. No rash noted. He is not diaphoretic. No erythema. No pallor.  Psychiatric: He has a normal mood and affect. His behavior is normal. Judgment and thought content normal.  Vitals reviewed.         Assessment & Plan:  Routine general medical examination at a health care facility - Plan: CBC with Differential/Platelet, COMPLETE METABOLIC PANEL WITH GFR, Lipid panel  Benign essential HTN - Plan: CBC with Differential/Platelet, COMPLETE METABOLIC PANEL WITH GFR, Lipid panel  Pure hypercholesterolemia - Plan: CBC with Differential/Platelet, COMPLETE METABOLIC PANEL WITH GFR, Lipid panel  Gastroesophageal reflux disease without esophagitis  Tubular adenoma of colon - Plan: Ambulatory referral to Gastroenterology  Physical exam today is completely normal.  I will schedule the patient to see GI for colonoscopy as it has been 5 years and he has a history of tubular adenomas.  His blood pressure today is outstanding.  We will check a CMP and a fasting lipid panel.  Ideally I like his LDL cholesterol to be less than 100.  Patient continues to have intermittent heartburn.  Some of this is related to stress.  He has had an EGD that was normal.  Keep eosinophilic esophagitis in the back of my mind in case heartburn worsens.  Patient is due to recheck prostate and PSA in February of next year.   He is due for a shingles shot which I recommended however I recommended that he wait until after he receives the second Covid vaccination which he will receive in March.  Otherwise regular anticipatory guidance is provided.

## 2019-12-22 LAB — COMPLETE METABOLIC PANEL WITH GFR
AG Ratio: 1.9 (calc) (ref 1.0–2.5)
ALT: 32 U/L (ref 9–46)
AST: 23 U/L (ref 10–35)
Albumin: 4.4 g/dL (ref 3.6–5.1)
Alkaline phosphatase (APISO): 53 U/L (ref 35–144)
BUN: 9 mg/dL (ref 7–25)
CO2: 24 mmol/L (ref 20–32)
Calcium: 9.8 mg/dL (ref 8.6–10.3)
Chloride: 105 mmol/L (ref 98–110)
Creat: 1.16 mg/dL (ref 0.70–1.25)
GFR, Est African American: 77 mL/min/{1.73_m2} (ref >=60)
GFR, Est Non African American: 67 mL/min/{1.73_m2} (ref >=60)
Globulin: 2.3 g/dL (calc) (ref 1.9–3.7)
Glucose, Bld: 105 mg/dL — ABNORMAL HIGH (ref 65–99)
Potassium: 4.4 mmol/L (ref 3.5–5.3)
Sodium: 139 mmol/L (ref 135–146)
Total Bilirubin: 0.7 mg/dL (ref 0.2–1.2)
Total Protein: 6.7 g/dL (ref 6.1–8.1)

## 2019-12-22 LAB — CBC WITH DIFFERENTIAL/PLATELET
Absolute Monocytes: 391 {cells}/uL (ref 200–950)
Basophils Absolute: 37 {cells}/uL (ref 0–200)
Basophils Relative: 0.6 %
Eosinophils Absolute: 81 {cells}/uL (ref 15–500)
Eosinophils Relative: 1.3 %
HCT: 46.3 % (ref 38.5–50.0)
Hemoglobin: 15.5 g/dL (ref 13.2–17.1)
Lymphs Abs: 2058 {cells}/uL (ref 850–3900)
MCH: 27.4 pg (ref 27.0–33.0)
MCHC: 33.5 g/dL (ref 32.0–36.0)
MCV: 81.8 fL (ref 80.0–100.0)
MPV: 10 fL (ref 7.5–12.5)
Monocytes Relative: 6.3 %
Neutro Abs: 3633 {cells}/uL (ref 1500–7800)
Neutrophils Relative %: 58.6 %
Platelets: 269 Thousand/uL (ref 140–400)
RBC: 5.66 Million/uL (ref 4.20–5.80)
RDW: 13.4 % (ref 11.0–15.0)
Total Lymphocyte: 33.2 %
WBC: 6.2 Thousand/uL (ref 3.8–10.8)

## 2019-12-22 LAB — LIPID PANEL
Cholesterol: 157 mg/dL
HDL: 38 mg/dL — ABNORMAL LOW
LDL Cholesterol (Calc): 102 mg/dL — ABNORMAL HIGH
Non-HDL Cholesterol (Calc): 119 mg/dL
Total CHOL/HDL Ratio: 4.1 (calc)
Triglycerides: 79 mg/dL

## 2019-12-23 ENCOUNTER — Telehealth: Payer: Self-pay | Admitting: Gastroenterology

## 2019-12-23 NOTE — Telephone Encounter (Signed)
Dr. Rush Landmark, pt's previous colonoscopy was in 2016 (in [place]).  Please advise recall date.

## 2019-12-24 ENCOUNTER — Encounter: Payer: Self-pay | Admitting: Gastroenterology

## 2019-12-24 NOTE — Telephone Encounter (Signed)
The Colonoscopy from 2016 had a single polyp removed with plan for a 5-year follow up. I would maintain that follow up schedule for now and then based on what we find/see on repeat procedure will dictate future follow up. Thanks. GM

## 2020-01-02 ENCOUNTER — Ambulatory Visit: Payer: 59 | Attending: Internal Medicine

## 2020-01-02 DIAGNOSIS — Z23 Encounter for immunization: Secondary | ICD-10-CM

## 2020-01-02 NOTE — Progress Notes (Signed)
   Covid-19 Vaccination Clinic  Name:  Nathaniel Smith    MRN: [id number] DOB: [birth year]  01/02/2020  Mr. Kreyling was observed post Covid-19 immunization for 15 minutes without incident. He was provided with Vaccine Information Sheet and instruction to access the V-Safe system.   Mr. Badami was instructed to call 911 with any severe reactions post vaccine: [name] Difficulty breathing  . Swelling of face and throat  . A fast heartbeat  . A bad rash all over body  . Dizziness and weakness   Immunizations Administered    Name Date Dose VIS Date Route   Moderna COVID-19 Vaccine 01/02/2020 12:04 PM 0.5 mL 09/22/2019 Intramuscular   Manufacturer: Moderna   Lot: [id number]   [id number]

## 2020-01-10 ENCOUNTER — Other Ambulatory Visit: Payer: Self-pay | Admitting: Family Medicine

## 2020-01-26 ENCOUNTER — Other Ambulatory Visit: Payer: Self-pay

## 2020-01-26 ENCOUNTER — Ambulatory Visit (AMBULATORY_SURGERY_CENTER): Payer: Self-pay | Admitting: *Deleted

## 2020-01-26 VITALS — Temp 97.5°F | Ht 70.0 in | Wt 210.0 lb

## 2020-01-26 DIAGNOSIS — Z86010 Personal history of colonic polyps: Secondary | ICD-10-CM

## 2020-01-26 NOTE — Progress Notes (Signed)
Comp covid vaccines 01-03-2020   No egg or soy allergy known to patient  No issues with past sedation with any surgeries  or procedures, no intubation problems  No diet pills per patient No home 02 use per patient  No blood thinners per patient  Pt denies issues with constipation  No A fib or A flutter  EMMI video sent to pt's e mail   Due to the COVID-19 pandemic we are asking patients to follow these guidelines. Please only bring one care partner. Please be aware that your care partner may wait in the car in the parking lot or if they feel like they will be too hot to wait in the car, they may wait in the lobby on the 4th floor. All care partners are required to wear a mask the entire time (we do not have any that we can provide them), they need to practice social distancing, and we will do a Covid check for all patient's and care partners when you arrive. Also we will check their temperature and your temperature. If the care partner waits in their car they need to stay in the parking lot the entire time and we will call them on their cell phone when the patient is ready for discharge so they can bring the car to the front of the building. Also all patient's will need to wear a mask into building.

## 2020-02-09 ENCOUNTER — Other Ambulatory Visit: Payer: Self-pay

## 2020-02-09 ENCOUNTER — Encounter: Payer: Self-pay | Admitting: Gastroenterology

## 2020-02-09 ENCOUNTER — Ambulatory Visit (AMBULATORY_SURGERY_CENTER): Payer: 59 | Admitting: Gastroenterology

## 2020-02-09 VITALS — BP 125/72 | HR 66 | Temp 97.3°F | Resp 16 | Ht 70.0 in | Wt 210.0 lb

## 2020-02-09 DIAGNOSIS — Z86010 Personal history of colonic polyps: Secondary | ICD-10-CM

## 2020-02-09 DIAGNOSIS — D123 Benign neoplasm of transverse colon: Secondary | ICD-10-CM

## 2020-02-09 MED ORDER — SODIUM CHLORIDE 0.9 % IV SOLN: 500.0000 mL | Freq: Once | INTRAVENOUS | Status: DC

## 2020-02-09 NOTE — Progress Notes (Signed)
Called to room to assist during endoscopic procedure.  Patient ID and intended procedure confirmed with present staff. Received instructions for my participation in the procedure from the performing physician.  

## 2020-02-09 NOTE — Progress Notes (Signed)
Pt's states no medical or surgical changes since previsit or office visit. Dt vitals, MO IV and JB temp

## 2020-02-09 NOTE — Op Note (Signed)
[facility] Patient Name: Nathaniel Smith Procedure Date: 02/09/2020 7:17 AM MRN: [id number] Endoscopist: Justice Britain , MD Age: 64 Referring MD:  Date of Birth: 1955-12-11 Gender: Male Account #: [id number] Procedure:                Colonoscopy Indications:              Surveillance: Personal history of adenomatous                            polyps on last colonoscopy 5 years ago Medicines:                Monitored Anesthesia Care Procedure:                Pre-Anesthesia Assessment:                           - Prior to the procedure, a History and Physical                            was performed, and patient medications and                            allergies were reviewed. The patient's tolerance of                            previous anesthesia was also reviewed. The risks                            and benefits of the procedure and the sedation                            options and risks were discussed with the patient.                            All questions were answered, and informed consent                            was obtained. Prior Anticoagulants: The patient has                            taken no previous anticoagulant or antiplatelet                            agents. ASA Grade Assessment: II - A patient with                            mild systemic disease. After reviewing the risks                            and benefits, the patient was deemed in                            satisfactory condition to undergo the procedure.  After obtaining informed consent, the colonoscope                            was passed under direct vision. Throughout the                            procedure, the patient's blood pressure, pulse, and                            oxygen saturations were monitored continuously. The                            Colonoscope was introduced through the anus and                            advanced to the 5 cm  into the ileum. The                            colonoscopy was performed without difficulty. The                            patient tolerated the procedure. The quality of the                            bowel preparation was good. The terminal ileum,                            ileocecal valve, appendiceal orifice, and rectum                            were photographed. Scope In: 8:04:59 AM Scope Out: 8:23:35 AM Scope Withdrawal Time: 0 hours 12 minutes 36 seconds  Total Procedure Duration: 0 hours 18 minutes 36 seconds  Findings:                 The digital rectal exam findings include                            hemorrhoids. Pertinent negatives include no                            palpable rectal lesions.                           The terminal ileum and ileocecal valve appeared                            normal.                           Two sessile polyps were found in the transverse                            colon and hepatic flexure. The polyps were 3 to 4  mm in size. These polyps were removed with a cold                            snare. Resection and retrieval were complete.                           Multiple small-mouthed diverticula were found in                            the recto-sigmoid colon, sigmoid colon and                            descending colon.                           Normal mucosa was found in the entire colon                            otherwise.                           Non-bleeding non-thrombosed internal hemorrhoids                            were found during retroflexion, during perianal                            exam and during digital exam. The hemorrhoids were                            Grade II (internal hemorrhoids that prolapse but                            reduce spontaneously). Complications:            No immediate complications. Estimated Blood Loss:     Estimated blood loss was minimal. Impression:                - Hemorrhoids found on digital rectal exam.                           - The examined portion of the ileum was normal.                           - Two 3 to 4 mm polyps in the transverse colon and                            at the hepatic flexure, removed with a cold snare.                            Resected and retrieved.                           - Diverticulosis in the recto-sigmoid colon, in the  sigmoid colon and in the descending colon.                           - Normal mucosa in the entire examined colon                            otherwise.                           - Non-bleeding non-thrombosed internal hemorrhoids. Recommendation:           - The patient will be observed post-procedure,                            until all discharge criteria are met.                           - Discharge patient to home.                           - Patient has a contact number available for                            emergencies. The signs and symptoms of potential                            delayed complications were discussed with the                            patient. Return to normal activities tomorrow.                            Written discharge instructions were provided to the                            patient.                           - High fiber diet.                           - Use FiberCon 1 tablet PO daily.                           - Await pathology results.                           - Repeat colonoscopy in 7 years for surveillance.                           - The findings and recommendations were discussed                            with the patient. Justice Britain, MD 02/09/2020 8:28:41 AM

## 2020-02-09 NOTE — Progress Notes (Signed)
Report to PACU, RN, vss, BBS= Clear.  

## 2020-02-09 NOTE — Patient Instructions (Signed)
Please read handouts provided. High fiber diet. Await pathology results. Use FiberCon 1 tablet daily.       YOU HAD AN ENDOSCOPIC PROCEDURE TODAY AT [facility]:   Refer to the procedure report that was given to you for any specific questions about what was found during the examination.  If the procedure report does not answer your questions, please call your gastroenterologist to clarify.  If you requested that your care partner not be given the details of your procedure findings, then the procedure report has been included in a sealed envelope for you to review at your convenience later.  YOU SHOULD EXPECT: Some feelings of bloating in the abdomen. Passage of more gas than usual.  Walking can help get rid of the air that was put into your GI tract during the procedure and reduce the bloating. If you had a lower endoscopy (such as a colonoscopy or flexible sigmoidoscopy) you may notice spotting of blood in your stool or on the toilet paper. If you underwent a bowel prep for your procedure, you may not have a normal bowel movement for a few days.  Please Note:  You might notice some irritation and congestion in your nose or some drainage.  This is from the oxygen used during your procedure.  There is no need for concern and it should clear up in a day or so.  SYMPTOMS TO REPORT IMMEDIATELY:   Following lower endoscopy (colonoscopy or flexible sigmoidoscopy):  Excessive amounts of blood in the stool  Significant tenderness or worsening of abdominal pains  Swelling of the abdomen that is new, acute  Fever of 100F or higher   For urgent or emergent issues, a gastroenterologist can be reached at any hour by calling [phone]. Do not use MyChart messaging for urgent concerns.    DIET:  We do recommend a small meal at first, but then you may proceed to your regular diet.  Drink plenty of fluids but you should avoid alcoholic beverages for 24 hours.  ACTIVITY:  You  should plan to take it easy for the rest of today and you should NOT DRIVE or use heavy machinery until tomorrow (because of the sedation medicines used during the test).    FOLLOW UP: Our staff will call the number listed on your records 48-72 hours following your procedure to check on you and address any questions or concerns that you may have regarding the information given to you following your procedure. If we do not reach you, we will leave a message.  We will attempt to reach you two times.  During this call, we will ask if you have developed any symptoms of COVID 19. If you develop any symptoms (ie: fever, flu-like symptoms, shortness of breath, cough etc.) before then, please call [phone].  If you test positive for Covid 19 in the 2 weeks post procedure, please call and report this information to [place].    If any biopsies were taken you will be contacted by phone or by letter within the next 1-3 weeks.  Please call us at [phone] if you have not heard about the biopsies in 3 weeks.    SIGNATURES/CONFIDENTIALITY: You and/or your care partner have signed paperwork which will be entered into your electronic medical record.  These signatures attest to the fact that that the information above on your After Visit Summary has been reviewed and is understood.  Full responsibility of the confidentiality of this discharge information lies with you  and/or your care-partner. 

## 2020-02-11 ENCOUNTER — Telehealth: Payer: Self-pay | Admitting: *Deleted

## 2020-02-11 NOTE — Telephone Encounter (Signed)
  Follow up Call-  Call back number 02/09/2020 08/19/2018  Post procedure Call Back phone  # [phone]  Permission to leave phone message Yes Yes  Some recent data might be hidden     Patient questions:  Do you have a fever, pain , or abdominal swelling? No. Pain Score  0 *  Have you tolerated food without any problems? Yes.    Have you been able to return to your normal activities? yes  Do you have any questions about your discharge instructions: Diet   No. Medications  No. Follow up visit  No.  Do you have questions or concerns about your Care? No.  Actions: * If pain score is 4 or above: No action needed, pain <4.  1. Have you developed a fever since your procedure? no  2.   Have you had an respiratory symptoms (SOB or cough) since your procedure? no  3.   Have you tested positive for COVID 19 since your procedure no  4.   Have you had any family members/close contacts diagnosed with the COVID 19 since your procedure? no   If yes to any of these questions please route to Joylene John, RN and Erenest Rasher, RN

## 2020-02-12 ENCOUNTER — Encounter: Payer: Self-pay | Admitting: Gastroenterology

## 2020-02-19 ENCOUNTER — Other Ambulatory Visit: Payer: Self-pay | Admitting: Family Medicine

## 2020-04-06 ENCOUNTER — Other Ambulatory Visit: Payer: Self-pay

## 2020-04-06 MED ORDER — LOSARTAN POTASSIUM 100 MG PO TABS: 100.0000 mg | ORAL_TABLET | Freq: Every day | ORAL | 3 refills | Status: DC

## 2020-05-25 ENCOUNTER — Ambulatory Visit
Admission: EM | Admit: 2020-05-25 | Discharge: 2020-05-25 | Disposition: A | Discharge status: Home / Self Care | Payer: 59 | Attending: Emergency Medicine | Admitting: Emergency Medicine

## 2020-05-25 ENCOUNTER — Other Ambulatory Visit: Payer: Self-pay

## 2020-05-25 DIAGNOSIS — M62838 Other muscle spasm: Secondary | ICD-10-CM | POA: Diagnosis not present

## 2020-05-25 DIAGNOSIS — M542 Cervicalgia: Secondary | ICD-10-CM

## 2020-05-25 MED ORDER — PREDNISONE 10 MG (21) PO TBPK
ORAL_TABLET | ORAL | 0 refills | Status: DC
Start: 2020-05-25 — End: 2021-04-25

## 2020-05-25 MED ORDER — DEXAMETHASONE SODIUM PHOSPHATE 10 MG/ML IJ SOLN
10.0000 mg | Freq: Once | INTRAMUSCULAR | Status: AC
  Administered 2020-05-25: 10 mg via INTRAMUSCULAR

## 2020-05-25 MED ORDER — KETOROLAC TROMETHAMINE 30 MG/ML IJ SOLN
30.0000 mg | Freq: Once | INTRAMUSCULAR | Status: AC
  Administered 2020-05-25: 30 mg via INTRAMUSCULAR

## 2020-05-25 MED ORDER — ACETAMINOPHEN 500 MG PO TABS
500.0000 mg | ORAL_TABLET | Freq: Four times a day (QID) | ORAL | 0 refills | Status: DC | PRN
Start: 2020-05-25 — End: 2021-04-25

## 2020-05-25 MED ORDER — CYCLOBENZAPRINE HCL 5 MG PO TABS: 5.0000 mg | ORAL_TABLET | Freq: Three times a day (TID) | ORAL | 0 refills | Status: DC | PRN

## 2020-05-25 NOTE — ED Triage Notes (Signed)
Pt has chronic neck pain but has had symptom relief for a while ,thenbegan hurting again recently

## 2020-05-25 NOTE — Discharge Instructions (Addendum)
Rest, ice and heat as needed Ensure adequate ROM as tolerated. Prescribed Tylenol inflammation and pain relief Prescribed flexeril  for muscle spasm.  Do not drive or operate heavy machinery while taking this medication Prescribed prednisone for inflammation Return here or go to ER if you have any new or worsening symptoms such as numbness/tingling of the inner thighs, loss of bladder or bowel control, headache/blurry vision, nausea/vomiting, confusion/altered mental status, dizziness, weakness, passing out, imbalance, etc..[name]

## 2020-05-25 NOTE — ED Provider Notes (Signed)
[place]   [id number] 05/25/20 Arrival Time: 1908   CC: Neck pain  SUBJECTIVE: History from: patient.  Nathaniel PENNELLA is a 64 y.o. male Complains of back pain that began XX days/ weeks ago.  It started after lifting a couch.  He localizes the pain to the RT/LT low back.  He describes the pain as constant and achy.  He has tried OTC medications without relief.  His symptoms are made worse with ROM.  He denies similar symptoms in the past.     ROS: As per HPI.  All other pertinent ROS negative.     Past Medical History:  Diagnosis Date  . Allergy   . Cancer ([place]) 07/02/10   prostate  . Hyperlipidemia 11/26/2009  . Hypertension 11/26/2010  . Pyrosis    Past Surgical History:  Procedure Laterality Date  . CARPAL TUNNEL RELEASE Right 10/22/2006    [facility]  . COLONOSCOPY    . POLYPECTOMY    . PROSTATE SURGERY  09/2010   seeds implant  . UPPER GASTROINTESTINAL ENDOSCOPY     No Known Allergies No current facility-administered medications on file prior to encounter.   Current Outpatient Medications on File Prior to Encounter  Medication Sig Dispense Refill  . amLODipine (NORVASC) 10 MG tablet TAKE 1 TABLET BY MOUTH  DAILY 90 tablet 3  . atorvastatin (LIPITOR) 20 MG tablet TAKE 1 TABLET BY MOUTH  DAILY 90 tablet 3  . cetirizine (ZYRTEC) 10 MG tablet Take 10 mg by mouth daily.    . Ferrous Sulfate (IRON) 28 MG TABS Take by mouth.    . losartan (COZAAR) 100 MG tablet Take 1 tablet (100 mg total) by mouth daily. 90 tablet 3  . Magnesium 500 MG TABS Take by mouth.    . Multiple Vitamin (MULTIVITAMIN) tablet Take 1 tablet by mouth daily.     Social History   Socioeconomic History  . Marital status: Married    Spouse name: Not on file  . Number of children: 1  . Years of education: Not on file  . Highest education level: Not on file  Occupational History  . Not on file  Tobacco Use  . Smoking status: Former Research scientist (life sciences)  . Smokeless tobacco: Never Used    Vaping Use  . Vaping Use: Never used  Substance and Sexual Activity  . Alcohol use: No  . Drug use: No  . Sexual activity: Yes    Birth control/protection: Condom  Other Topics Concern  . Not on file  Social History Narrative   He drives dump trucks. Hauls rock, dirt, etc.   Divorced. Has one son and one stepdaughter.   :Walks on  Treadmill mill for 1 mile every morning Monday through Friday.   He smoked 1/2 pack a day starting at age 20 and quit at age 89.--smoked for about 26 years.    Has Smoked none for 13 years.   Social Determinants of Health   Financial Resource Strain:   . Difficulty of Paying Living Expenses:   Food Insecurity:   . Worried About Charity fundraiser in the Last Year:   . Arboriculturist in the Last Year:   Transportation Needs:   . Film/video editor (Medical):   [name] Lack of Transportation (Non-Medical):   Physical Activity:   . Days of Exercise per Week:   . Minutes of Exercise per Session:   Stress:   . Feeling of Stress :   Social Connections:   .  Frequency of Communication with Friends and Family:   . Frequency of Social Gatherings with Friends and Family:   . Attends Religious Services:   . Active Member of Clubs or Organizations:   . Attends Archivist Meetings:   [name] Marital Status:   Intimate Partner Violence:   . Fear of Current or Ex-Partner:   . Emotionally Abused:   [name] Physically Abused:   . Sexually Abused:    Family History  Problem Relation Age of Onset  . Diabetes Father   . Heart disease Father   . Heart disease Brother 54       Stent at 54--CAD  . Colon cancer Maternal Uncle   . Heart disease Paternal Aunt   . Prostate cancer Paternal Uncle   . Heart disease Paternal Uncle   . Esophageal cancer Neg Hx   . Inflammatory bowel disease Neg Hx   . Liver disease Neg Hx   . Pancreatic cancer Neg Hx   . Rectal cancer Neg Hx   . Stomach cancer Neg Hx   . Colon polyps Neg Hx     OBJECTIVE:  Vitals:    05/25/20 1927  BP: (!) 167/97  Pulse: 90  Resp: 16  Temp: 98.8 F (37.1 C)  TempSrc: Oral  SpO2: 97%     Physical Exam Vitals and nursing note reviewed.  Constitutional:      General: He is not in acute distress.    Appearance: Normal appearance. He is normal weight. He is not ill-appearing, toxic-appearing or diaphoretic.  Neck:     Vascular: No carotid bruit.  Cardiovascular:     Rate and Rhythm: Normal rate and regular rhythm.     Pulses: Normal pulses.     Heart sounds: Normal heart sounds. No murmur heard.  No friction rub. No gallop.   Pulmonary:     Effort: Pulmonary effort is normal. No respiratory distress.     Breath sounds: Normal breath sounds. No stridor. No wheezing, rhonchi or rales.  Chest:     Chest wall: No tenderness.  Musculoskeletal:     Cervical back: Rigidity, spasms and tenderness present.  Lymphadenopathy:     Cervical: No cervical adenopathy.  Neurological:     Mental Status: He is alert and oriented to person, place, and time.     Cranial Nerves: No cranial nerve deficit.     Sensory: No sensory deficit.     Motor: No weakness.     Coordination: Coordination normal.      LABS:  No results found for this or any previous visit (from the past 24 hour(s)).   ASSESSMENT & PLAN:  1. Neck muscle spasm   2. Neck pain     Meds ordered this encounter  Medications  . ketorolac (TORADOL) 30 MG/ML injection 30 mg  . dexamethasone (DECADRON) injection 10 mg  . predniSONE (STERAPRED UNI-PAK 21 TAB) 10 MG (21) TBPK tablet    Sig: Take 6 tabs by mouth daily  for 1 days, then 5 tabs for 1 days, then 4 tabs for 1 days, then 3 tabs for 1 days, 2 tabs for 1 days, then 1 tab by mouth daily for 1 days    Dispense:  21 tablet    Refill:  0  . cyclobenzaprine (FLEXERIL) 5 MG tablet    Sig: Take 1 tablet (5 mg total) by mouth 3 (three) times daily as needed.    Dispense:  30 tablet    Refill:  0  .  acetaminophen (TYLENOL) 500 MG tablet    Sig: Take 1  tablet (500 mg total) by mouth every 6 (six) hours as needed.    Dispense:  30 tablet    Refill:  0    Discharge instructions Rest, ice and heat as needed Ensure adequate ROM as tolerated. Prescribed Tylenol inflammation and pain relief Prescribed flexeril  for muscle spasm.  Do not drive or operate heavy machinery while taking this medication Prescribed prednisone for inflammation Return here or go to ER if you have any new or worsening symptoms such as numbness/tingling of the inner thighs, loss of bladder or bowel control, headache/blurry vision, nausea/vomiting, confusion/altered mental status, dizziness, weakness, passing out, imbalance, etc...   Reviewed expectations re: course of current medical issues. Questions answered. Outlined signs and symptoms indicating need for more acute intervention. Patient verbalized understanding. After Visit Summary given.         Emerson Monte, FNP 05/25/20 2012

## 2020-05-27 ENCOUNTER — Ambulatory Visit: Payer: 59 | Admitting: Family Medicine

## 2020-08-26 ENCOUNTER — Ambulatory Visit (INDEPENDENT_AMBULATORY_CARE_PROVIDER_SITE_OTHER): Payer: 59 | Admitting: Family Medicine

## 2020-08-26 ENCOUNTER — Other Ambulatory Visit: Payer: Self-pay

## 2020-08-26 VITALS — BP 124/84 | HR 82 | Temp 98.4°F | Ht 70.0 in | Wt 213.0 lb

## 2020-08-26 DIAGNOSIS — M5030 Other cervical disc degeneration, unspecified cervical region: Secondary | ICD-10-CM | POA: Diagnosis not present

## 2020-08-26 DIAGNOSIS — Z23 Encounter for immunization: Secondary | ICD-10-CM

## 2020-08-26 MED ORDER — TIZANIDINE HCL 4 MG PO TABS
4.0000 mg | ORAL_TABLET | Freq: Four times a day (QID) | ORAL | 0 refills | Status: DC | PRN
Start: 2020-08-26 — End: 2021-04-25

## 2020-08-26 MED ORDER — CELECOXIB 200 MG PO CAPS: 200.0000 mg | ORAL_CAPSULE | Freq: Two times a day (BID) | ORAL | 2 refills | Status: DC

## 2020-08-26 NOTE — Progress Notes (Signed)
Subjective:    Patient ID: Nathaniel Smith, male    DOB: [birth year], 64 y.o.   MRN: [id number] Patient has known degenerative disc disease in the cervical spine from C5-C6 through C7-T1.  In the past he has had chronic bilateral neck pain secondary to this with compensatory muscle spasms.  Ultimately this benefited from physical therapy.  Recently he has been having increasing neck pain.  The pain is worse first thing in the morning.  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  I believe he is getting chronic bilateral neck pain due to the degenerative disc disease with compensatory muscle spasms.  He denies any cervical radiculopathy.  He was seen at an urgent care in August and was given prednisone.  He has been saving this medication and using it as needed.  Today the pain in the neck was worse.  Sometimes is on the left side.  Sometimes is on the right side.  However it usually goes away after he takes 3 or 4 of the 10 mg prednisone's. Past Medical History:  Diagnosis Date  . Allergy   . Cancer ([place]) 07/02/10   prostate  . Hyperlipidemia 11/26/2009  . Hypertension 11/26/2010  . Pyrosis    Past Surgical History:  Procedure Laterality Date  . CARPAL TUNNEL RELEASE Right 10/22/2006   [facility]  . COLONOSCOPY    . POLYPECTOMY    . PROSTATE SURGERY  09/2010   seeds implant  . UPPER GASTROINTESTINAL ENDOSCOPY     Current Outpatient Medications on File Prior to Visit  Medication Sig Dispense Refill  . acetaminophen (TYLENOL) 500 MG tablet Take 1 tablet (500 mg total) by mouth every 6 (six) hours as needed. 30 tablet 0  . amLODipine (NORVASC) 10 MG tablet TAKE 1 TABLET BY MOUTH  DAILY 90 tablet 3  . atorvastatin (LIPITOR) 20 MG tablet TAKE 1 TABLET BY MOUTH  DAILY 90 tablet 3  . Ferrous Sulfate (IRON) 28 MG TABS  Take by mouth.    . losartan (COZAAR) 100 MG tablet Take 1 tablet (100 mg total) by mouth daily. 90 tablet 3  . Magnesium 500 MG TABS Take by mouth.    . Multiple Vitamin (MULTIVITAMIN) tablet Take 1 tablet by mouth daily.    . predniSONE (STERAPRED UNI-PAK 21 TAB) 10 MG (21) TBPK tablet Take 6 tabs by mouth daily  for 1 days, then 5 tabs for 1 days, then 4 tabs for 1 days, then 3 tabs for 1 days, 2 tabs for 1 days, then 1 tab by mouth daily for 1 days 21 tablet 0   No current facility-administered medications on file prior to visit.   No Known Allergies Social History   Socioeconomic History  . Marital status: Married    Spouse name: Not on file  . Number of children: 1  . Years of education: Not on file  . Highest education level: Not on file  Occupational History  . Not on file  Tobacco Use  . Smoking status: Former Research scientist (life sciences)  . Smokeless tobacco: Never Used  Vaping Use  . Vaping Use: Never used  Substance and Sexual Activity  . Alcohol use: No  . Drug use: No  . Sexual activity: Yes    Birth control/protection: Condom  Other Topics Concern  .  Not on file  Social History Narrative   He drives dump trucks. Hauls rock, dirt, etc.   Divorced. Has one son and one stepdaughter.   :Walks on  Treadmill mill for 1 mile every morning Monday through Friday.   He smoked 1/2 pack a day starting at age 64 and quit at age 76.--smoked for about 26 years.    Has Smoked none for 13 years.   Social Determinants of Health   Financial Resource Strain:   . Difficulty of Paying Living Expenses: Not on file  Food Insecurity:   . Worried About Charity fundraiser in the Last Year: Not on file  . Ran Out of Food in the Last Year: Not on file  Transportation Needs:   . Lack of Transportation (Medical): Not on file  . Lack of Transportation (Non-Medical): Not on file  Physical Activity:   . Days of Exercise per Week: Not on file  . Minutes of Exercise per Session: Not on file  Stress:   .  Feeling of Stress : Not on file  Social Connections:   . Frequency of Communication with Friends and Family: Not on file  . Frequency of Social Gatherings with Friends and Family: Not on file  . Attends Religious Services: Not on file  . Active Member of Clubs or Organizations: Not on file  . Attends Archivist Meetings: Not on file  . Marital Status: Not on file  Intimate Partner Violence:   . Fear of Current or Ex-Partner: Not on file  . Emotionally Abused: Not on file  . Physically Abused: Not on file  . Sexually Abused: Not on file   Family History  Problem Relation Age of Onset  . Diabetes Father   . Heart disease Father   . Heart disease Brother 54       Stent at 54--CAD  . Colon cancer Maternal Uncle   . Heart disease Paternal Aunt   . Prostate cancer Paternal Uncle   . Heart disease Paternal Uncle   . Esophageal cancer Neg Hx   . Inflammatory bowel disease Neg Hx   . Liver disease Neg Hx   . Pancreatic cancer Neg Hx   . Rectal cancer Neg Hx   . Stomach cancer Neg Hx   . Colon polyps Neg Hx       Review of Systems  Musculoskeletal: Positive for neck pain.  All other systems reviewed and are negative.      Objective:   Physical Exam Vitals reviewed.  Constitutional:      General: He is not in acute distress.    Appearance: He is well-developed. He is not diaphoretic.  Neck:     Thyroid: No thyromegaly.     Vascular: No JVD.     Trachea: No tracheal deviation.   Cardiovascular:     Rate and Rhythm: Normal rate and regular rhythm.     Heart sounds: Normal heart sounds. No murmur heard.  No friction rub. No gallop.   Pulmonary:     Effort: Pulmonary effort is normal. No respiratory distress.     Breath sounds: Normal breath sounds. No stridor. No wheezing or rales.  Chest:     Chest wall: No tenderness.  Musculoskeletal:     Cervical back: Normal range of motion and neck supple. Spasms and tenderness present. No swelling, deformity,  lacerations or bony tenderness.  Lymphadenopathy:     Cervical: No cervical adenopathy.  Assessment & Plan:  DDD (degenerative disc disease), cervical  I believe the patient has degenerative disc disease in the cervical spine exacerbated by his job with compensatory muscle spasms.  I have recommended Celebrex 200 mg twice daily as needed for neck pain.  He has to monitor for worsening reflux on this.  He can use Zanaflex 2 to 4 mg every 6-8 hours as needed for neck spasms and neck stiffness.  If pain does not improve, we can consult physical therapy again.

## 2020-11-29 ENCOUNTER — Ambulatory Visit (INDEPENDENT_AMBULATORY_CARE_PROVIDER_SITE_OTHER): Payer: 59 | Admitting: Family Medicine

## 2020-11-29 ENCOUNTER — Encounter: Payer: Self-pay | Admitting: Family Medicine

## 2020-11-29 ENCOUNTER — Other Ambulatory Visit: Payer: Self-pay

## 2020-11-29 ENCOUNTER — Ambulatory Visit: Payer: 59 | Admitting: Family Medicine

## 2020-11-29 VITALS — BP 142/80 | HR 71 | Temp 97.8°F | Resp 17 | Ht 70.0 in | Wt 211.0 lb

## 2020-11-29 DIAGNOSIS — E7800 Pure hypercholesterolemia, unspecified: Secondary | ICD-10-CM | POA: Diagnosis not present

## 2020-11-29 DIAGNOSIS — I10 Essential (primary) hypertension: Secondary | ICD-10-CM | POA: Diagnosis not present

## 2020-11-29 DIAGNOSIS — Z0001 Encounter for general adult medical examination with abnormal findings: Secondary | ICD-10-CM

## 2020-11-29 DIAGNOSIS — M5030 Other cervical disc degeneration, unspecified cervical region: Secondary | ICD-10-CM | POA: Diagnosis not present

## 2020-11-29 DIAGNOSIS — D126 Benign neoplasm of colon, unspecified: Secondary | ICD-10-CM

## 2020-11-29 DIAGNOSIS — Z0000 Encounter for general adult medical examination without abnormal findings: Secondary | ICD-10-CM

## 2020-11-29 DIAGNOSIS — K219 Gastro-esophageal reflux disease without esophagitis: Secondary | ICD-10-CM

## 2020-11-29 NOTE — Progress Notes (Signed)
Subjective:    Patient ID: Nathaniel Smith, male    DOB: [birth year], 65 y.o.   MRN: [id number]  HPI Patient is a very pleasant 65 year old African-American gentleman who has a past medical history of prostate cancer status post radioactive seed treatment.  He has been seeing Dr. Karsten Ro with urology.  His last PSA was in March 2021.  It was 0.4.  He is due to repeat that this March.  They have deferred to allow [place] to monitor his PSA moving forward as his been more than 10 years since he had seed placement.  He also has a history of hypertension and hyperlipidemia.  Blood pressure today is borderline at 142/80.  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  Therefore we will just continue to monitor as it is borderline.  His only issue moving forward is he continues to have pain in his neck.  He has degenerative disc disease in the cervical spine.  Arthritis drugs provided minimal relief.  He is seeing a chiropractor right now.  He states at the present time that the pain is not severe enough to keep him awake at night.  We discussed seeing a neurosurgeon for possible facet joint injections or epidural steroid injections however at the present time he does not feel that the pain warrants that.  He has had all 3 doses of his COVID vaccine and his flu shot.  He has not yet old enough to get the pneumonia vaccine.  He is due for the shingles vaccine and we discussed that today.  He had a colonoscopy in April of last year.  They found 2 polyps which were tubular adenomas.  His gastroenterologist recommended a repeat colonoscopy in 7 years.  Therefore he is due in 2028.  Past Medical History:  Diagnosis Date  . Allergy   . Cancer ([place]) 07/02/10   prostate  . Hyperlipidemia 11/26/2009  . Hypertension 11/26/2010  . Pyrosis    Past Surgical History:  Procedure Laterality Date  . CARPAL TUNNEL RELEASE Right 10/22/2006   [facility]  . COLONOSCOPY    .  POLYPECTOMY    . PROSTATE SURGERY  09/2010   seeds implant  . UPPER GASTROINTESTINAL ENDOSCOPY     Current Outpatient Medications on File Prior to Visit  Medication Sig Dispense Refill  . acetaminophen (TYLENOL) 500 MG tablet Take 1 tablet (500 mg total) by mouth every 6 (six) hours as needed. 30 tablet 0  . amLODipine (NORVASC) 10 MG tablet TAKE 1 TABLET BY MOUTH  DAILY 90 tablet 3  . atorvastatin (LIPITOR) 20 MG tablet TAKE 1 TABLET BY MOUTH  DAILY 90 tablet 3  . celecoxib (CELEBREX) 200 MG capsule Take 1 capsule (200 mg total) by mouth 2 (two) times daily. 60 capsule 2  . Ferrous Sulfate (IRON) 28 MG TABS Take by mouth.    . losartan (COZAAR) 100 MG tablet Take 1 tablet (100 mg total) by mouth daily. 90 tablet 3  . Magnesium 500 MG TABS Take by mouth.    . Multiple Vitamin (MULTIVITAMIN) tablet Take 1 tablet by mouth daily.    . predniSONE (STERAPRED UNI-PAK 21 TAB) 10 MG (21) TBPK tablet Take 6 tabs by mouth daily  for 1 days, then 5 tabs for 1 days, then 4 tabs for 1 days, then 3 tabs for 1 days, 2 tabs for 1 days, then 1 tab by mouth daily for 1 days 21 tablet  0  . tiZANidine (ZANAFLEX) 4 MG tablet Take 1 tablet (4 mg total) by mouth every 6 (six) hours as needed for muscle spasms (can make you sleepy). 30 tablet 0   No current facility-administered medications on file prior to visit.   No Known Allergies Social History   Socioeconomic History  . Marital status: Married    Spouse name: Not on file  . Number of children: 1  . Years of education: Not on file  . Highest education level: Not on file  Occupational History  . Not on file  Tobacco Use  . Smoking status: Former Research scientist (life sciences)  . Smokeless tobacco: Never Used  . Tobacco comment: 2001 quit  Vaping Use  . Vaping Use: Never used  Substance and Sexual Activity  . Alcohol use: No  . Drug use: No  . Sexual activity: Yes    Birth control/protection: Condom  Other Topics Concern  . Not on file  Social History Narrative   He  drives dump trucks. Hauls rock, dirt, etc.   Divorced. Has one son and one stepdaughter.   :Walks on  Treadmill mill for 1 mile every morning Monday through Friday.   He smoked 1/2 pack a day starting at age 82 and quit at age 20.--smoked for about 26 years.    Has Smoked none for 13 years.   Social Determinants of Health   Financial Resource Strain: Not on file  Food Insecurity: Not on file  Transportation Needs: Not on file  Physical Activity: Not on file  Stress: Not on file  Social Connections: Not on file  Intimate Partner Violence: Not on file   Family History  Problem Relation Age of Onset  . Diabetes Father   . Heart disease Father   . Heart disease Brother 54       Stent at 54--CAD  . Colon cancer Maternal Uncle   . Heart disease Paternal Aunt   . Prostate cancer Paternal Uncle   . Heart disease Paternal Uncle   . Esophageal cancer Neg Hx   . Inflammatory bowel disease Neg Hx   . Liver disease Neg Hx   . Pancreatic cancer Neg Hx   . Rectal cancer Neg Hx   . Stomach cancer Neg Hx   . Colon polyps Neg Hx       Review of Systems  All other systems reviewed and are negative.      Objective:   Physical Exam Vitals reviewed.  Constitutional:      General: He is not in acute distress.    Appearance: He is well-developed. He is not diaphoretic.  HENT:     Head: Normocephalic and atraumatic.     Right Ear: External ear normal.     Left Ear: External ear normal.     Nose: Nose normal.     Mouth/Throat:     Pharynx: No oropharyngeal exudate.  Eyes:     General: No scleral icterus.       Right eye: No discharge.        Left eye: No discharge.     Conjunctiva/sclera: Conjunctivae normal.     Pupils: Pupils are equal, round, and reactive to light.  Neck:     Thyroid: No thyromegaly.     Vascular: No JVD.     Trachea: No tracheal deviation.  Cardiovascular:     Rate and Rhythm: Normal rate and regular rhythm.     Heart sounds: Normal heart sounds. No  murmur heard. No friction  rub. No gallop.   Pulmonary:     Effort: Pulmonary effort is normal. No respiratory distress.     Breath sounds: Normal breath sounds. No stridor. No wheezing or rales.  Chest:     Chest wall: No tenderness.  Abdominal:     General: Bowel sounds are normal. There is no distension.     Palpations: Abdomen is soft. There is no mass.     Tenderness: There is no abdominal tenderness. There is no guarding or rebound.  Musculoskeletal:        General: No tenderness. Normal range of motion.     Cervical back: Normal range of motion and neck supple.  Lymphadenopathy:     Cervical: No cervical adenopathy.  Skin:    General: Skin is warm.     Coloration: Skin is not pale.     Findings: No erythema or rash.  Neurological:     Mental Status: He is alert and oriented to person, place, and time.     Cranial Nerves: No cranial nerve deficit.     Motor: No abnormal muscle tone.     Coordination: Coordination normal.     Deep Tendon Reflexes: Reflexes are normal and symmetric.  Psychiatric:        Behavior: Behavior normal.        Thought Content: Thought content normal.        Judgment: Judgment normal.           Assessment & Plan:  Pure hypercholesterolemia - Plan: CBC with Differential/Platelet, COMPLETE METABOLIC PANEL WITH GFR, Lipid panel  DDD (degenerative disc disease), cervical  Benign essential HTN  Tubular adenoma of colon  Gastroesophageal reflux disease without esophagitis  Routine general medical examination at a health care facility  Blood pressure today is borderline however has been better in the past so we will just continue to monitor.  I will check a CBC, CMP, fasting lipid panel today.  The patient can return at his convenience anytime after March and I will be happy to check a PSA to screen for prostate cancer recurrence.  Regarding his cervical degenerative disc disease, I would be happy to give him medication for pain relief.  However  the next step would likely be a neurosurgery consultation for steroid injections.  The patient politely declines that at the present time but if he changes his mind I will be happy to put that referral in.  He is due for repeat colonoscopy in 2028.  I recommended the shingles vaccine today.  I recommended pneumonia vaccine after 65.  Flu shot and Covid vaccine are up-to-date.

## 2020-11-30 LAB — CBC WITH DIFFERENTIAL/PLATELET
Absolute Monocytes: 460 cells/uL (ref 200–950)
Basophils Absolute: 37 cells/uL (ref 0–200)
Basophils Relative: 0.5 %
Eosinophils Absolute: 51 cells/uL (ref 15–500)
Eosinophils Relative: 0.7 %
HCT: 48.7 % (ref 38.5–50.0)
Hemoglobin: 16.3 g/dL (ref 13.2–17.1)
Lymphs Abs: 2300 cells/uL (ref 850–3900)
MCH: 27.5 pg (ref 27.0–33.0)
MCHC: 33.5 g/dL (ref 32.0–36.0)
MCV: 82.1 fL (ref 80.0–100.0)
MPV: 10.1 fL (ref 7.5–12.5)
Monocytes Relative: 6.3 %
Neutro Abs: 4453 cells/uL (ref 1500–7800)
Neutrophils Relative %: 61 %
Platelets: 269 10*3/uL (ref 140–400)
RBC: 5.93 10*6/uL — ABNORMAL HIGH (ref 4.20–5.80)
RDW: 13.2 % (ref 11.0–15.0)
Total Lymphocyte: 31.5 %
WBC: 7.3 10*3/uL (ref 3.8–10.8)

## 2020-11-30 LAB — COMPLETE METABOLIC PANEL WITH GFR
AG Ratio: 1.8 (calc) (ref 1.0–2.5)
ALT: 25 U/L (ref 9–46)
AST: 18 U/L (ref 10–35)
Albumin: 4.3 g/dL (ref 3.6–5.1)
Alkaline phosphatase (APISO): 58 U/L (ref 35–144)
BUN: 12 mg/dL (ref 7–25)
CO2: 23 mmol/L (ref 20–32)
Calcium: 9.6 mg/dL (ref 8.6–10.3)
Chloride: 107 mmol/L (ref 98–110)
Creat: 1.15 mg/dL (ref 0.70–1.25)
GFR, Est African American: 78 mL/min/{1.73_m2} (ref >=60)
GFR, Est Non African American: 67 mL/min/{1.73_m2} (ref >=60)
Globulin: 2.4 g/dL (calc) (ref 1.9–3.7)
Glucose, Bld: 111 mg/dL — ABNORMAL HIGH (ref 65–99)
Potassium: 4.5 mmol/L (ref 3.5–5.3)
Sodium: 140 mmol/L (ref 135–146)
Total Bilirubin: 0.8 mg/dL (ref 0.2–1.2)
Total Protein: 6.7 g/dL (ref 6.1–8.1)

## 2020-11-30 LAB — LIPID PANEL
Cholesterol: 150 mg/dL (ref <200)
HDL: 35 mg/dL — ABNORMAL LOW (ref >=40)
LDL Cholesterol (Calc): 96 mg/dL (calc)
Non-HDL Cholesterol (Calc): 115 mg/dL (calc) (ref <130)
Total CHOL/HDL Ratio: 4.3 (calc) (ref <5.0)
Triglycerides: 96 mg/dL (ref <150)

## 2020-12-01 NOTE — Progress Notes (Signed)
Left detailed message on pt vm as requested on dpr

## 2020-12-31 ENCOUNTER — Other Ambulatory Visit: Payer: Self-pay | Admitting: Family Medicine

## 2021-01-09 ENCOUNTER — Telehealth: Payer: Self-pay

## 2021-01-20 ENCOUNTER — Other Ambulatory Visit: Payer: Self-pay | Admitting: Family Medicine

## 2021-03-31 NOTE — Telephone Encounter (Signed)
error 

## 2021-04-17 ENCOUNTER — Other Ambulatory Visit: Payer: Self-pay | Admitting: Family Medicine

## 2021-04-21 ENCOUNTER — Other Ambulatory Visit: Payer: Self-pay

## 2021-04-21 DIAGNOSIS — Z125 Encounter for screening for malignant neoplasm of prostate: Secondary | ICD-10-CM

## 2021-04-22 LAB — PSA: PSA: 0.05 ng/mL (ref <=4.00)

## 2021-04-25 ENCOUNTER — Other Ambulatory Visit: Payer: Self-pay

## 2021-04-25 ENCOUNTER — Ambulatory Visit: Payer: 59 | Admitting: Family Medicine

## 2021-04-25 ENCOUNTER — Encounter: Payer: Self-pay | Admitting: Family Medicine

## 2021-04-25 VITALS — BP 138/74 | HR 78 | Temp 98.6°F | Resp 14 | Ht 70.0 in | Wt 209.0 lb

## 2021-04-25 DIAGNOSIS — M25531 Pain in right wrist: Secondary | ICD-10-CM | POA: Diagnosis not present

## 2021-04-25 NOTE — Progress Notes (Signed)
Subjective:    Patient ID: Nathaniel Smith, male    DOB: [birth year], 65 y.o.   MRN: [id number]  HPI  Patient is a very pleasant 65 year old African-American gentleman with a history of prostate cancer.  He presents with a 45-month history of pain in his right wrist.  The pain is located at the base of his thumb just distal to the radius and the styloid process.  He is tender to palpation there.  He does have some tenderness to palpation in the anatomic snuffbox.  However the majority his pain seems to be tendon related.  He has a positive Finkelstein's test.  He has tenderness to palpation all along the tendon.  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  He denies any falls or injuries. Past Medical History:  Diagnosis Date   Allergy    Cancer ([place]) 07/02/10   prostate   Hyperlipidemia 11/26/2009   Hypertension 11/26/2010   Pyrosis    Past Surgical History:  Procedure Laterality Date   CARPAL TUNNEL RELEASE Right 10/22/2006   [facility]   COLONOSCOPY     POLYPECTOMY     PROSTATE SURGERY  09/2010   seeds implant   UPPER GASTROINTESTINAL ENDOSCOPY     Current Outpatient Medications on File Prior to Visit  Medication Sig Dispense Refill   acetaminophen (TYLENOL) 500 MG tablet Take 1 tablet (500 mg total) by mouth every 6 (six) hours as needed. 30 tablet 0   amLODipine (NORVASC) 10 MG tablet TAKE 1 TABLET BY MOUTH  DAILY 90 tablet 3   atorvastatin (LIPITOR) 20 MG tablet TAKE 1 TABLET BY MOUTH  DAILY 90 tablet 3   celecoxib (CELEBREX) 200 MG capsule Take 1 capsule (200 mg total) by mouth 2 (two) times daily. 60 capsule 2   Ferrous Sulfate (IRON) 28 MG TABS Take by mouth.     losartan (COZAAR) 100 MG tablet TAKE 1 TABLET BY MOUTH  DAILY 90 tablet 3   Magnesium 500 MG TABS Take by mouth.     Multiple Vitamin (MULTIVITAMIN) tablet Take 1 tablet by mouth daily.     predniSONE  (STERAPRED UNI-PAK 21 TAB) 10 MG (21) TBPK tablet Take 6 tabs by mouth daily  for 1 days, then 5 tabs for 1 days, then 4 tabs for 1 days, then 3 tabs for 1 days, 2 tabs for 1 days, then 1 tab by mouth daily for 1 days 21 tablet 0   tiZANidine (ZANAFLEX) 4 MG tablet Take 1 tablet (4 mg total) by mouth every 6 (six) hours as needed for muscle spasms (can make you sleepy). 30 tablet 0   No current facility-administered medications on file prior to visit.   No Known Allergies Social History   Socioeconomic History   Marital status: Married    Spouse name: Not on file   Number of children: 1   Years of education: Not on file   Highest education level: Not on file  Occupational History   Not on file  Tobacco Use   Smoking status: Former    Pack years: 0.00   Smokeless tobacco: Never   Tobacco comments:    2001 quit  Vaping Use   Vaping Use: Never used  Substance and Sexual Activity   Alcohol use: No   Drug use: No   Sexual activity: Yes    Birth control/protection: Condom  Other Topics  Concern   Not on file  Social History Narrative   He drives dump trucks. Hauls rock, dirt, etc.   Divorced. Has one son and one stepdaughter.   :Walks on  [place]mill mill for 1 mile every morning Monday through Friday.   He smoked 1/2 pack a day starting at age 19 and quit at age 64.--smoked for about 26 years.    Has Smoked none for 13 years.   Social Determinants of Health   Financial Resource Strain: Not on file  Food Insecurity: Not on file  Transportation Needs: Not on file  Physical Activity: Not on file  Stress: Not on file  Social Connections: Not on file  Intimate Partner Violence: Not on file   Family History  Problem Relation Age of Onset   Diabetes Father    Heart disease Father    Heart disease Brother 73       Stent at 54--CAD   Colon cancer Maternal Uncle    Heart disease Paternal Aunt    Prostate cancer Paternal Uncle    Heart disease Paternal Uncle    Esophageal  cancer Neg Hx    Inflammatory bowel disease Neg Hx    Liver disease Neg Hx    Pancreatic cancer Neg Hx    Rectal cancer Neg Hx    Stomach cancer Neg Hx    Colon polyps Neg Hx       Review of Systems     Objective:   Physical Exam Vitals reviewed.  Constitutional:      Appearance: Normal appearance.  Cardiovascular:     Rate and Rhythm: Normal rate and regular rhythm.     Heart sounds: Normal heart sounds.  Pulmonary:     Effort: Pulmonary effort is normal. No respiratory distress.     Breath sounds: Normal breath sounds.  Musculoskeletal:     Right hand: Tenderness and bony tenderness present. No swelling. Decreased range of motion. Normal strength. Normal sensation. There is no disruption of two-point discrimination.  Neurological:     Mental Status: He is alert.         Assessment & Plan:  Right wrist pain - Plan: DG Wrist Complete Right Differential diagnosis includes arthritis at the right wrist versus de Quervain's tenosynovitis.  I favor tendinitis.  I recommended a cortisone injection but the patient would like to try a thumb spica splint for 2 to 3 weeks first in addition to NSAIDs and rest.  I would like to get an x-ray of the wrist given his history of prostate cancer.

## 2021-05-02 ENCOUNTER — Ambulatory Visit
Admission: RE | Admit: 2021-05-02 | Discharge: 2021-05-02 | Disposition: A | Discharge status: Home / Self Care | Payer: 59 | Source: Ambulatory Visit | Attending: Family Medicine | Admitting: Family Medicine

## 2021-05-02 DIAGNOSIS — M25531 Pain in right wrist: Secondary | ICD-10-CM | POA: Diagnosis not present

## 2021-05-02 DIAGNOSIS — M1811 Unilateral primary osteoarthritis of first carpometacarpal joint, right hand: Secondary | ICD-10-CM | POA: Diagnosis not present

## 2021-05-26 ENCOUNTER — Telehealth: Payer: Self-pay

## 2021-05-26 DIAGNOSIS — M778 Other enthesopathies, not elsewhere classified: Secondary | ICD-10-CM

## 2021-05-26 NOTE — Telephone Encounter (Signed)
Pt called in wanting to request a referral for an orthopedic about his hand. Please advise  Cb#:[phone]

## 2021-05-26 NOTE — Telephone Encounter (Signed)
Patient was seen in office on 04/25/2021 for R wrist pain and X-ray ordered. Imaging showed mild arthritis at the wrist between the wrist bone and the base of the thumb, but pain thought to be tendonitis.   Ok to place referral?

## 2021-05-29 NOTE — Telephone Encounter (Signed)
Referral orders placed

## 2021-06-01 ENCOUNTER — Ambulatory Visit: Payer: Medicare Other | Admitting: Orthopaedic Surgery

## 2021-06-01 ENCOUNTER — Other Ambulatory Visit: Payer: Self-pay

## 2021-06-01 ENCOUNTER — Ambulatory Visit (INDEPENDENT_AMBULATORY_CARE_PROVIDER_SITE_OTHER): Payer: Medicare Other | Admitting: Orthopaedic Surgery

## 2021-06-01 DIAGNOSIS — M654 Radial styloid tenosynovitis [de Quervain]: Secondary | ICD-10-CM | POA: Insufficient documentation

## 2021-06-01 MED ORDER — METHYLPREDNISOLONE ACETATE 40 MG/ML IJ SUSP
40.0000 mg | INTRAMUSCULAR | Status: AC | PRN
  Administered 2021-06-01: 40 mg

## 2021-06-01 MED ORDER — BUPIVACAINE HCL 0.25 % IJ SOLN
2.0000 mL | INTRAMUSCULAR | Status: AC | PRN
  Administered 2021-06-01: 2 mL

## 2021-06-01 MED ORDER — LIDOCAINE HCL 1 % IJ SOLN
1.0000 mL | INTRAMUSCULAR | Status: AC | PRN
  Administered 2021-06-01: 1 mL

## 2021-06-01 NOTE — Progress Notes (Signed)
Office Visit Note   Patient: Nathaniel Smith           Date of Birth: 11-26-1955           MRN: [id number] Visit Date: 06/01/2021              Requested by: Susy Frizzle, MD [address] [place],  [place] [address] PCP: Susy Frizzle, MD   Assessment & Plan: Visit Diagnoses:  1. Tendinitis, de Quervain's     Plan: First dorsal compartment injection performed.  He got good relief.  If he has persistent problems he will call and we discussed surgical intervention if he has persistent problems.  Follow-Up Instructions: No follow-ups on file.   Orders:  Orders Placed This Encounter  Procedures   Hand/UE Inj   No orders of the defined types were placed in this encounter.     Procedures: Hand/UE Inj: R extensor compartment 1 for de Quervain's tenosynovitis on 06/01/2021 8:37 PM Medications: 1 mL lidocaine 1 %; 2 mL bupivacaine 0.25 %; 40 mg methylPREDNISolone acetate 40 MG/ML     Clinical Data: No additional findings.   Subjective: Chief Complaint  Patient presents with   Right Wrist - Pain    HPI 65 year old male who his wife's patient mine had problems with right wrist pain since the end of May.  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  Does have hypertension no diabetes does not smoke.  PCP suggested Invega injection versus brace.  He was somewhat worried about pain with injection he has tried Advil.  At times he feels sharp pain particularly with motion of his wrist that he states feels like "lightning".  He points directly over the first dorsal compartment where he has pain.  Review of Systems all other systems noncontributory to HPI.   Objective: Vital Signs: There were no vitals taken for this visit.  Physical Exam Constitutional:      Appearance: He is well-developed.  HENT:     Head: Normocephalic and atraumatic.     Right Ear: External ear normal.     Left Ear: External ear normal.  Eyes:      Pupils: Pupils are equal, round, and reactive to light.  Neck:     Thyroid: No thyromegaly.     Trachea: No tracheal deviation.  Cardiovascular:     Rate and Rhythm: Normal rate.  Pulmonary:     Effort: Pulmonary effort is normal.     Breath sounds: No wheezing.  Abdominal:     General: Bowel sounds are normal.     Palpations: Abdomen is soft.  Musculoskeletal:     Cervical back: Neck supple.  Skin:    General: Skin is warm and dry.     Capillary Refill: Capillary refill takes less than 2 seconds.  Neurological:     Mental Status: He is alert and oriented to person, place, and time.  Psychiatric:        Behavior: Behavior normal.        Thought Content: Thought content normal.        Judgment: Judgment normal.    Ortho Exam first dorsal compartment tenderness positive Finkelstein.  Negative Phalen's over the carpal tunnel negative carpal compression test no thenar atrophy.  Dorsal compartments other than first are all normal.  Specialty Comments:  No specialty comments available.  Imaging: No results found.   PMFS History: Patient Active Problem List   Diagnosis  Date Noted   Tendinitis, de Quervain's 06/01/2021   Gastroesophageal reflux disease 07/01/2018   Pyrosis 07/01/2018   Atypical chest pain 07/01/2018   History of colonoscopy 07/01/2018   Hemorrhoids 07/01/2018   Family history of premature coronary artery disease 12/16/2013   Hypertension 11/26/2010   Cancer ([place]) 07/02/2010   Hyperlipidemia 11/26/2009   Past Medical History:  Diagnosis Date   Allergy    Cancer ([place]) 07/02/10   prostate   Hyperlipidemia 11/26/2009   Hypertension 11/26/2010   Pyrosis     Family History  Problem Relation Age of Onset   Diabetes Father    Heart disease Father    Heart disease Brother 73       Stent at 54--CAD   Colon cancer Maternal Uncle    Heart disease Paternal Aunt    Prostate cancer Paternal Uncle    Heart disease Paternal Uncle    Esophageal cancer Neg Hx     Inflammatory bowel disease Neg Hx    Liver disease Neg Hx    Pancreatic cancer Neg Hx    Rectal cancer Neg Hx    Stomach cancer Neg Hx    Colon polyps Neg Hx     Past Surgical History:  Procedure Laterality Date   CARPAL TUNNEL RELEASE Right 10/22/2006   [facility]   COLONOSCOPY     POLYPECTOMY     PROSTATE SURGERY  09/2010   seeds implant   UPPER GASTROINTESTINAL ENDOSCOPY     Social History   Occupational History   Not on file  Tobacco Use   Smoking status: Former   Smokeless tobacco: Never   Tobacco comments:    2001 quit  Vaping Use   Vaping Use: Never used  Substance and Sexual Activity   Alcohol use: No   Drug use: No   Sexual activity: Yes    Birth control/protection: Condom

## 2021-06-05 ENCOUNTER — Telehealth: Payer: Self-pay | Admitting: Family Medicine

## 2021-06-05 NOTE — Telephone Encounter (Signed)
Patient called to request refund of $25 copay paid for DOS 04/25/21 ; patient states his insurance doesn't require him to pay the copay.   Please advise at [phone].

## 2021-06-07 ENCOUNTER — Ambulatory Visit: Payer: Medicare Other | Admitting: Orthopaedic Surgery

## 2021-06-12 NOTE — Telephone Encounter (Signed)
Sending request to our billing department

## 2021-08-04 ENCOUNTER — Ambulatory Visit (INDEPENDENT_AMBULATORY_CARE_PROVIDER_SITE_OTHER): Payer: Medicare Other | Admitting: Family Medicine

## 2021-08-04 ENCOUNTER — Other Ambulatory Visit: Payer: Self-pay

## 2021-08-04 VITALS — BP 142/64 | HR 82 | Temp 98.3°F | Resp 14 | Ht 70.0 in | Wt 210.0 lb

## 2021-08-04 DIAGNOSIS — L600 Ingrowing nail: Secondary | ICD-10-CM

## 2021-08-04 MED ORDER — HYDROCODONE-ACETAMINOPHEN 5-325 MG PO TABS: 1.0000 | ORAL_TABLET | Freq: Four times a day (QID) | ORAL | 0 refills | Status: DC | PRN

## 2021-08-04 NOTE — Progress Notes (Signed)
Subjective:    Patient ID: Nathaniel Smith, male    DOB: 09-16-1956, 65 y.o.   MRN: [id number]  HPI Patient is a very pleasant 65 year old African-American gentleman who presents today with an ingrown toenail on his left foot.  The great toe of the left foot has a medial nail fold that is ingrown.  The surrounding skin is erythematous and tender to palpation and is bleeding at the very tip of the toe.  He is requesting that I remove the ingrown portion of the toenail. Past Medical History:  Diagnosis Date   Allergy    Cancer ([place]) 07/02/10   prostate   Hyperlipidemia 11/26/2009   Hypertension 11/26/2010   Pyrosis    Past Surgical History:  Procedure Laterality Date   CARPAL TUNNEL RELEASE Right 10/22/2006   [facility]   COLONOSCOPY     POLYPECTOMY     PROSTATE SURGERY  09/2010   seeds implant   UPPER GASTROINTESTINAL ENDOSCOPY     Current Outpatient Medications on File Prior to Visit  Medication Sig Dispense Refill   amLODipine (NORVASC) 10 MG tablet TAKE 1 TABLET BY MOUTH  DAILY 90 tablet 3   atorvastatin (LIPITOR) 20 MG tablet TAKE 1 TABLET BY MOUTH  DAILY 90 tablet 3   Ferrous Sulfate (IRON) 28 MG TABS Take by mouth.     losartan (COZAAR) 100 MG tablet TAKE 1 TABLET BY MOUTH  DAILY 90 tablet 3   Magnesium 500 MG TABS Take by mouth.     Multiple Vitamin (MULTIVITAMIN) tablet Take 1 tablet by mouth daily.     No current facility-administered medications on file prior to visit.   No Known Allergies Social History   Socioeconomic History   Marital status: Married    Spouse name: Not on file   Number of children: 1   Years of education: Not on file   Highest education level: Not on file  Occupational History   Not on file  Tobacco Use   Smoking status: Former   Smokeless tobacco: Never   Tobacco comments:    2001 quit  Vaping Use   Vaping Use: Never used  Substance and Sexual Activity   Alcohol use: No   Drug use: No   Sexual activity: Yes    Birth  control/protection: Condom  Other Topics Concern   Not on file  Social History Narrative   He drives dump trucks. Hauls rock, dirt, etc.   Divorced. Has one son and one stepdaughter.   :Walks on  Treadmill mill for 1 mile every morning Monday through Friday.   He smoked 1/2 pack a day starting at age 73 and quit at age 8.--smoked for about 26 years.    Has Smoked none for 13 years.   Social Determinants of Health   Financial Resource Strain: Not on file  Food Insecurity: Not on file  Transportation Needs: Not on file  Physical Activity: Not on file  Stress: Not on file  Social Connections: Not on file  Intimate Partner Violence: Not on file      Review of Systems  All other systems reviewed and are negative.     Objective:   Physical Exam Vitals reviewed.  Cardiovascular:     Rate and Rhythm: Normal rate and regular rhythm.     Heart sounds: Normal heart sounds.  Pulmonary:     Effort: Pulmonary effort is normal. No respiratory distress.     Breath sounds: Normal breath sounds. No wheezing or rales.  Musculoskeletal:       Feet:   The medial nail edge of the left great toe is ingrown and the skin is overlapping is erythematous tender and swollen.       Assessment & Plan:  Ingrown toenail of left foot  The toe was anesthetized 0.1% lidocaine without epinephrine. A tourniquet was then applied to the base of the toe. The lateral nail edge was then separated from the underlying nail bed using a metal elevator. A longitudinal cut was then made using a pair of scissors all the way back to the proximal nail fold. Using a pair of hemostats, the ingrown portion of the nail was removed with simple traction. Neosporin was then applied to the wound bed, the toe was wrapped with petroleum gauze and Coban. Tourniquet was removed. There was no significant blood loss. Patient tolerated the procedure without complications. He was given a one-time prescription of  hydrocodone/acetaminophen 5/325 one by mouth every 6 hours when necessary pain. He was given 10 tablets.

## 2021-12-04 ENCOUNTER — Encounter: Payer: Self-pay | Admitting: Family Medicine

## 2021-12-04 ENCOUNTER — Ambulatory Visit (INDEPENDENT_AMBULATORY_CARE_PROVIDER_SITE_OTHER): Payer: Medicare Other | Admitting: Family Medicine

## 2021-12-04 ENCOUNTER — Other Ambulatory Visit: Payer: Self-pay

## 2021-12-04 VITALS — BP 130/82 | HR 74 | Temp 97.3°F | Ht 70.0 in | Wt 211.0 lb

## 2021-12-04 DIAGNOSIS — R739 Hyperglycemia, unspecified: Secondary | ICD-10-CM

## 2021-12-04 DIAGNOSIS — Z8546 Personal history of malignant neoplasm of prostate: Secondary | ICD-10-CM

## 2021-12-04 DIAGNOSIS — I10 Essential (primary) hypertension: Secondary | ICD-10-CM

## 2021-12-04 DIAGNOSIS — Z0000 Encounter for general adult medical examination without abnormal findings: Secondary | ICD-10-CM | POA: Diagnosis not present

## 2021-12-04 NOTE — Progress Notes (Signed)
Subjective:    Patient ID: Nathaniel Smith, male    DOB: 07-11-1956, 66 y.o.   MRN: [id number]  HPI Patient is a very pleasant 66 year old African-American male who is here today for a complete physical exam. Medical history is significant for hypertension, hyperlipidemia, a family history of premature coronary artery disease, and prostate cancer. His prostate is monitored by his urologist due to his history of prostate cancer status post radioactive seed placement.  His urologist has released him at this point.  Therefore he is due for a PSA from [place] today.  He has a history of tubular adenomas.  His last colonoscopy was in 2021.  They saw 2, tubular adenomas.  Recommended a repeat colonoscopy in 2028.  Patient is due for Pneumovax 23.  He defers this today but he will consider it in the future.  His flu shot, shingles shot, COVID vaccination and tetanus shot are all up-to-date.  Otherwise he is doing well.  His blood pressure today is excellent. Past Medical History:  Diagnosis Date   Allergy    Cancer ([place]) 07/02/10   prostate   Hyperlipidemia 11/26/2009   Hypertension 11/26/2010   Pyrosis    Past Surgical History:  Procedure Laterality Date   CARPAL TUNNEL RELEASE Right 10/22/2006   [facility]   COLONOSCOPY     POLYPECTOMY     PROSTATE SURGERY  09/2010   seeds implant   UPPER GASTROINTESTINAL ENDOSCOPY     Current Outpatient Medications on File Prior to Visit  Medication Sig Dispense Refill   amLODipine (NORVASC) 10 MG tablet TAKE 1 TABLET BY MOUTH  DAILY 90 tablet 3   atorvastatin (LIPITOR) 20 MG tablet TAKE 1 TABLET BY MOUTH  DAILY 90 tablet 3   Ferrous Sulfate (IRON) 28 MG TABS Take by mouth.     HYDROcodone-acetaminophen (NORCO) 5-325 MG tablet Take 1 tablet by mouth every 6 (six) hours as needed for moderate pain. 10 tablet 0   losartan (COZAAR) 100 MG tablet TAKE 1 TABLET BY MOUTH  DAILY 90 tablet 3   Magnesium 500 MG TABS Take by mouth.     Multiple Vitamin  (MULTIVITAMIN) tablet Take 1 tablet by mouth daily.     No current facility-administered medications on file prior to visit.   No Known Allergies Social History   Socioeconomic History   Marital status: Married    Spouse name: Not on file   Number of children: 1   Years of education: Not on file   Highest education level: Not on file  Occupational History   Not on file  Tobacco Use   Smoking status: Former   Smokeless tobacco: Never   Tobacco comments:    2001 quit  Vaping Use   Vaping Use: Never used  Substance and Sexual Activity   Alcohol use: No   Drug use: No   Sexual activity: Yes    Birth control/protection: Condom  Other Topics Concern   Not on file  Social History Narrative   He drives dump trucks. Hauls rock, dirt, etc.   Divorced. Has one son and one stepdaughter.   :Walks on  Treadmill mill for 1 mile every morning Monday through Friday.   He smoked 1/2 pack a day starting at age 66 and quit at age 27.--smoked for about 26 years.    Has Smoked none for 13 years.   Social Determinants of Health   Financial Resource Strain: Not on file  Food Insecurity: Not on file  Transportation  Needs: Not on file  Physical Activity: Not on file  Stress: Not on file  Social Connections: Not on file  Intimate Partner Violence: Not on file   Family History  Problem Relation Age of Onset   Diabetes Father    Heart disease Father    Heart disease Brother 29       Stent at 54--CAD   Colon cancer Maternal Uncle    Heart disease Paternal Aunt    Prostate cancer Paternal Uncle    Heart disease Paternal Uncle    Esophageal cancer Neg Hx    Inflammatory bowel disease Neg Hx    Liver disease Neg Hx    Pancreatic cancer Neg Hx    Rectal cancer Neg Hx    Stomach cancer Neg Hx    Colon polyps Neg Hx       Review of Systems  All other systems reviewed and are negative.     Objective:   Physical Exam Vitals reviewed.  Constitutional:      General: He is not in  acute distress.    Appearance: He is well-developed. He is not diaphoretic.  HENT:     Head: Normocephalic and atraumatic.     Right Ear: External ear normal.     Left Ear: External ear normal.     Nose: Nose normal.     Mouth/Throat:     Pharynx: No oropharyngeal exudate.  Eyes:     General: No scleral icterus.       Right eye: No discharge.        Left eye: No discharge.     Conjunctiva/sclera: Conjunctivae normal.     Pupils: Pupils are equal, round, and reactive to light.  Neck:     Thyroid: No thyromegaly.     Vascular: No JVD.     Trachea: No tracheal deviation.  Cardiovascular:     Rate and Rhythm: Normal rate and regular rhythm.     Heart sounds: Normal heart sounds. No murmur heard.   No friction rub. No gallop.  Pulmonary:     Effort: Pulmonary effort is normal. No respiratory distress.     Breath sounds: Normal breath sounds. No stridor. No wheezing or rales.  Chest:     Chest wall: No tenderness.  Abdominal:     General: Bowel sounds are normal. There is no distension.     Palpations: Abdomen is soft. There is no mass.     Tenderness: There is no abdominal tenderness. There is no guarding or rebound.  Musculoskeletal:        General: No tenderness. Normal range of motion.     Cervical back: Normal range of motion and neck supple.  Lymphadenopathy:     Cervical: No cervical adenopathy.  Skin:    General: Skin is warm.     Coloration: Skin is not pale.     Findings: No erythema or rash.  Neurological:     Mental Status: He is alert and oriented to person, place, and time.     Cranial Nerves: No cranial nerve deficit.     Motor: No abnormal muscle tone.     Coordination: Coordination normal.     Deep Tendon Reflexes: Reflexes are normal and symmetric.  Psychiatric:        Behavior: Behavior normal.        Thought Content: Thought content normal.        Judgment: Judgment normal.          Assessment &  Plan:  General medical exam - Plan: CBC with  Differential/Platelet, Lipid panel, COMPLETE METABOLIC PANEL WITH GFR, PSA  History of prostate cancer - Plan: PSA  Benign essential HTN - Plan: CBC with Differential/Platelet, Lipid panel, COMPLETE METABOLIC PANEL WITH GFR Very happy with patient's blood pressure today.  I will check a CBC, CMP, lipid panel, and PSA.  Goal LDL cholesterol is less than 100.  Colonoscopy is due again in 2028.  Recommended Pneumovax 23 today but the patient deferred for now.  The remainder of his immunizations are up-to-date.  He denies any falls, depression, or memory loss.

## 2021-12-05 NOTE — Addendum Note (Signed)
Addended by: Wadie Lessen on: 12/05/2021 05:23 PM   Modules accepted: Orders

## 2021-12-08 ENCOUNTER — Other Ambulatory Visit: Payer: Self-pay

## 2021-12-08 DIAGNOSIS — R739 Hyperglycemia, unspecified: Secondary | ICD-10-CM

## 2021-12-08 LAB — PSA: PSA: 0.06 ng/mL (ref <=4.00)

## 2021-12-08 LAB — CBC WITH DIFFERENTIAL/PLATELET
Absolute Monocytes: 351 cells/uL (ref 200–950)
Basophils Absolute: 33 cells/uL (ref 0–200)
Basophils Relative: 0.5 %
Eosinophils Absolute: 78 cells/uL (ref 15–500)
Eosinophils Relative: 1.2 %
HCT: 47.9 % (ref 38.5–50.0)
Hemoglobin: 16 g/dL (ref 13.2–17.1)
Lymphs Abs: 2178 cells/uL (ref 850–3900)
MCH: 27.6 pg (ref 27.0–33.0)
MCHC: 33.4 g/dL (ref 32.0–36.0)
MCV: 82.7 fL (ref 80.0–100.0)
MPV: 10.1 fL (ref 7.5–12.5)
Monocytes Relative: 5.4 %
Neutro Abs: 3861 cells/uL (ref 1500–7800)
Neutrophils Relative %: 59.4 %
Platelets: 278 10*3/uL (ref 140–400)
RBC: 5.79 10*6/uL (ref 4.20–5.80)
RDW: 13.3 % (ref 11.0–15.0)
Total Lymphocyte: 33.5 %
WBC: 6.5 10*3/uL (ref 3.8–10.8)

## 2021-12-08 LAB — LIPID PANEL
Cholesterol: 160 mg/dL (ref <200)
HDL: 36 mg/dL — ABNORMAL LOW (ref >=40)
LDL Cholesterol (Calc): 103 mg/dL (calc) — ABNORMAL HIGH
Non-HDL Cholesterol (Calc): 124 mg/dL (calc) (ref <130)
Total CHOL/HDL Ratio: 4.4 (calc) (ref <5.0)
Triglycerides: 115 mg/dL (ref <150)

## 2021-12-08 LAB — TEST AUTHORIZATION

## 2021-12-08 LAB — COMPLETE METABOLIC PANEL WITH GFR
AG Ratio: 1.6 (calc) (ref 1.0–2.5)
ALT: 31 U/L (ref 9–46)
AST: 21 U/L (ref 10–35)
Albumin: 4.3 g/dL (ref 3.6–5.1)
Alkaline phosphatase (APISO): 54 U/L (ref 35–144)
BUN: 11 mg/dL (ref 7–25)
CO2: 21 mmol/L (ref 20–32)
Calcium: 9.5 mg/dL (ref 8.6–10.3)
Chloride: 106 mmol/L (ref 98–110)
Creat: 1.18 mg/dL (ref 0.70–1.35)
Globulin: 2.7 g/dL (calc) (ref 1.9–3.7)
Glucose, Bld: 126 mg/dL — ABNORMAL HIGH (ref 65–99)
Potassium: 4.2 mmol/L (ref 3.5–5.3)
Sodium: 137 mmol/L (ref 135–146)
Total Bilirubin: 0.4 mg/dL (ref 0.2–1.2)
Total Protein: 7 g/dL (ref 6.1–8.1)
eGFR: 68 mL/min/{1.73_m2} (ref >=60)

## 2021-12-08 LAB — HEMOGLOBIN A1C W/OUT EAG: Hgb A1c MFr Bld: 6.9 % of total Hgb — ABNORMAL HIGH (ref <5.7)

## 2021-12-08 MED ORDER — METFORMIN HCL ER 500 MG PO TB24: 1000.0000 mg | ORAL_TABLET | Freq: Every day | ORAL | 5 refills | Status: DC

## 2021-12-11 ENCOUNTER — Encounter: Payer: Self-pay | Admitting: Family Medicine

## 2021-12-18 ENCOUNTER — Telehealth: Payer: Self-pay

## 2021-12-18 NOTE — Telephone Encounter (Signed)
Pt called in wanting to know if should come in for an appt with pcp for an ongoing issue with a ingrown toe nail, or should he just ask to get referral to a podiatrist? Please advise.  Cb#:[phone]

## 2021-12-18 NOTE — Telephone Encounter (Signed)
Patient is welcome to come in to office for evaluation of nail problem. Please contact to schedule. Thank you!

## 2021-12-20 ENCOUNTER — Ambulatory Visit (INDEPENDENT_AMBULATORY_CARE_PROVIDER_SITE_OTHER): Payer: Medicare Other | Admitting: Family Medicine

## 2021-12-20 ENCOUNTER — Encounter: Payer: Self-pay | Admitting: Family Medicine

## 2021-12-20 ENCOUNTER — Other Ambulatory Visit: Payer: Self-pay

## 2021-12-20 VITALS — BP 142/82 | HR 78 | Temp 97.0°F | Resp 18 | Ht 70.0 in | Wt 211.0 lb

## 2021-12-20 DIAGNOSIS — L600 Ingrowing nail: Secondary | ICD-10-CM | POA: Diagnosis not present

## 2021-12-20 MED ORDER — HYDROCODONE-ACETAMINOPHEN 5-325 MG PO TABS: 1.0000 | ORAL_TABLET | Freq: Four times a day (QID) | ORAL | 0 refills | Status: DC | PRN

## 2021-12-20 NOTE — Progress Notes (Signed)
? ?Subjective:  ? ? Patient ID: Nathaniel Smith, male    DOB: February 15, 1956, 66 y.o.   MRN: [id number] ? ?HPI ?08/04/21 ?Patient is a very pleasant 66 year old African-American gentleman who presents today with an ingrown toenail on his left foot.  The great toe of the left foot has a medial nail fold that is ingrown.  The surrounding skin is erythematous and tender to palpation and is bleeding at the very tip of the toe.  He is requesting that I remove the ingrown portion of the toenail.  At that time my plan was: ?The toe was anesthetized 0.1% lidocaine without epinephrine. A tourniquet was then applied to the base of the toe. The lateral nail edge was then separated from the underlying nail bed using a metal elevator. A longitudinal cut was then made using a pair of scissors all the way back to the proximal nail fold. Using a pair of hemostats, the ingrown portion of the nail was removed with simple traction. Neosporin was then applied to the wound bed, the toe was wrapped with petroleum gauze and Coban. Tourniquet was removed. There was no significant blood loss. Patient tolerated the procedure without complications. He was given a one-time prescription of hydrocodone/acetaminophen 5/325 one by mouth every 6 hours when necessary pain. He was given 10 tablets. ? ?12/20/21 ?Patient complains of pain in his right great toe.  The lateral nail fold has grown down into the underlying nailbed.  The skin adjacent to the lateral nail edge is sore and swollen and tender to touch.  He is requesting that I removed that portion of the ingrown nail and perform a matrixectomy to ensure that the nail does not grow back. ?Past Medical History:  ?Diagnosis Date  ?? Allergy   ?? Cancer ([place]) 07/02/10  ? prostate  ?? Hyperlipidemia 11/26/2009  ?? Hypertension 11/26/2010  ?? Pyrosis   ? ?Past Surgical History:  ?Procedure Laterality Date  ?? CARPAL TUNNEL RELEASE Right 10/22/2006  ? [facility]  ?? COLONOSCOPY    ?? POLYPECTOMY    ??  PROSTATE SURGERY  09/2010  ? seeds implant  ?? UPPER GASTROINTESTINAL ENDOSCOPY    ? ?Current Outpatient Medications on File Prior to Visit  ?Medication Sig Dispense Refill  ?? amLODipine (NORVASC) 10 MG tablet TAKE 1 TABLET BY MOUTH  DAILY 90 tablet 3  ?? atorvastatin (LIPITOR) 20 MG tablet TAKE 1 TABLET BY MOUTH  DAILY 90 tablet 3  ?? Ferrous Sulfate (IRON) 28 MG TABS Take by mouth.    ?? HYDROcodone-acetaminophen (NORCO) 5-325 MG tablet Take 1 tablet by mouth every 6 (six) hours as needed for moderate pain. (Patient not taking: Reported on 12/04/2021) 10 tablet 0  ?? losartan (COZAAR) 100 MG tablet TAKE 1 TABLET BY MOUTH  DAILY 90 tablet 3  ?? Magnesium 500 MG TABS Take by mouth.    ?? metFORMIN (GLUCOPHAGE-XR) 500 MG 24 hr tablet Take 2 tablets (1,000 mg total) by mouth daily with breakfast. 60 tablet 5  ?? Multiple Vitamin (MULTIVITAMIN) tablet Take 1 tablet by mouth daily.    ? ?No current facility-administered medications on file prior to visit.  ? ?No Known Allergies ?Social History  ? ?Socioeconomic History  ?? Marital status: Married  ?  Spouse name: Not on file  ?? Number of children: 1  ?? Years of education: Not on file  ?? Highest education level: Not on file  ?Occupational History  ?? Not on file  ?Tobacco Use  ?? Smoking status: Former  ??  Smokeless tobacco: Never  ?? Tobacco comments:  ?  2001 quit  ?Vaping Use  ?? Vaping Use: Never used  ?Substance and Sexual Activity  ?? Alcohol use: No  ?? Drug use: No  ?? Sexual activity: Yes  ?  Birth control/protection: Condom  ?Other Topics Concern  ?? Not on file  ?Social History Narrative  ? He drives dump trucks. Hauls rock, dirt, etc.  ? Divorced. Has one son and one stepdaughter.  ? :[place]  [place] 1 mile every morning Monday through Friday.  ? He smoked 1/2 pack a day starting at age 26 and quit at age 75.--smoked for about 26 years.  ?  Has Smoked none for 13 years.  ? ?Social Determinants of Health  ? ?Financial Resource Strain: Not on  file  ?Food Insecurity: Not on file  ?Transportation Needs: Not on file  ?Physical Activity: Not on file  ?Stress: Not on file  ?Social Connections: Not on file  ?Intimate Partner Violence: Not on file  ? ? ? ? ?Review of Systems  ?All other systems reviewed and are negative. ? ?   ?Objective:  ? Physical Exam ?Vitals reviewed.  ?Cardiovascular:  ?   Rate and Rhythm: Normal rate and regular rhythm.  ?   Heart sounds: Normal heart sounds.  ?Pulmonary:  ?   Effort: Pulmonary effort is normal. No respiratory distress.  ?   Breath sounds: Normal breath sounds. No wheezing or rales.  ?Musculoskeletal:  ?     Feet: ? ? ?The lateral nail edge of the right great toe is ingrown and the skin is overlapping is erythematous tender and swollen. ? ? ? ?   ?Assessment & Plan:  ?Ingrown toenail of right foot ?The toe was anesthetized 0.1% lidocaine without epinephrine. A tourniquet was then applied to the base of the toe. The lateral nail edge was then separated from the underlying nail bed using a metal elevator. A longitudinal cut was then made using a pair of scissors all the way back to the proximal nail fold. Using a pair of hemostats, the ingrown portion of the nail was removed with simple traction.  Neosporin was applied to the surrounding tissue.  Using a Q-tip soaked in phenol, I performed a matrixectomy underneath the proximal nail fold applying the phenol for a total of 30 seconds.  This was repeated x3.  Phenol was then cleansed using copious saturation with isopropyl alcohol.  Neosporin was then applied to the wound bed, the toe was wrapped with petroleum gauze and Coban. Tourniquet was removed. There was no significant blood loss. Patient tolerated the procedure without complications. He was given a one-time prescription of hydrocodone/acetaminophen 5/325 one by mouth every 6 hours when necessary pain. He was given 10 tablets. ?

## 2022-01-15 ENCOUNTER — Other Ambulatory Visit: Payer: Self-pay | Admitting: Family Medicine

## 2022-02-08 ENCOUNTER — Other Ambulatory Visit: Payer: Self-pay | Admitting: Family Medicine

## 2022-03-06 ENCOUNTER — Ambulatory Visit (INDEPENDENT_AMBULATORY_CARE_PROVIDER_SITE_OTHER): Payer: Medicare Other | Admitting: Family Medicine

## 2022-03-06 ENCOUNTER — Ambulatory Visit: Payer: Medicare Other | Admitting: Family Medicine

## 2022-03-06 VITALS — BP 130/82 | HR 63 | Temp 99.3°F | Ht 70.0 in | Wt 206.8 lb

## 2022-03-06 DIAGNOSIS — E119 Type 2 diabetes mellitus without complications: Secondary | ICD-10-CM | POA: Diagnosis not present

## 2022-03-06 DIAGNOSIS — I10 Essential (primary) hypertension: Secondary | ICD-10-CM

## 2022-03-06 NOTE — Progress Notes (Signed)
? ?Subjective:  ? ? Patient ID: Nathaniel Smith, male    DOB: [birth year], 66 y.o.   MRN: [id number] ? ?HPI ?Recently, the patient was diagnosed with type 2 diabetes.  We started him on metformin 1000 mg extended release once a day but he was unable to tolerate this.  He is only been taking 500 mg once a day.  He is here today to recheck his blood work.  He denies any chest pain shortness of breath or dyspnea on exertion.  He had his diabetic eye exam performed in March and it was normal.  He denies any neuropathy or pain in his feet. ?Past Medical History:  ?Diagnosis Date  ? Allergy   ? Cancer ([place]) 07/02/10  ? prostate  ? Hyperlipidemia 11/26/2009  ? Hypertension 11/26/2010  ? Pyrosis   ? ?Past Surgical History:  ?Procedure Laterality Date  ? CARPAL TUNNEL RELEASE Right 10/22/2006  ? [facility]  ? COLONOSCOPY    ? POLYPECTOMY    ? PROSTATE SURGERY  09/2010  ? seeds implant  ? UPPER GASTROINTESTINAL ENDOSCOPY    ? ?Current Outpatient Medications on File Prior to Visit  ?Medication Sig Dispense Refill  ? amLODipine (NORVASC) 10 MG tablet TAKE 1 TABLET BY MOUTH  DAILY 90 tablet 3  ? atorvastatin (LIPITOR) 20 MG tablet TAKE 1 TABLET BY MOUTH  DAILY 90 tablet 3  ? Ferrous Sulfate (IRON) 28 MG TABS Take by mouth.    ? HYDROcodone-acetaminophen (NORCO) 5-325 MG tablet Take 1 tablet by mouth every 6 (six) hours as needed for moderate pain. 10 tablet 0  ? losartan (COZAAR) 100 MG tablet TAKE 1 TABLET BY MOUTH  DAILY 90 tablet 3  ? Magnesium 500 MG TABS Take by mouth.    ? metFORMIN (GLUCOPHAGE-XR) 500 MG 24 hr tablet Take 2 tablets (1,000 mg total) by mouth daily with breakfast. 60 tablet 5  ? Multiple Vitamin (MULTIVITAMIN) tablet Take 1 tablet by mouth daily.    ? ?No current facility-administered medications on file prior to visit.  ? ?No Known Allergies ?Social History  ? ?Socioeconomic History  ? Marital status: Married  ?  Spouse name: Not on file  ? Number of children: 1  ? Years of education: Not on file  ?  Highest education level: Not on file  ?Occupational History  ? Not on file  ?Tobacco Use  ? Smoking status: Former  ? Smokeless tobacco: Never  ? Tobacco comments:  ?  2001 quit  ?Vaping Use  ? Vaping Use: Never used  ?Substance and Sexual Activity  ? Alcohol use: No  ? Drug use: No  ? Sexual activity: Yes  ?  Birth control/protection: Condom  ?Other Topics Concern  ? Not on file  ?Social History Narrative  ? He drives dump trucks. Hauls rock, dirt, etc.  ? Divorced. Has one son and one stepdaughter.  ? :[place]ks on  [place] for 1 mile every morning Monday through Friday.  ? He smoked 1/2 pack a day starting at age 53 and quit at age 63.--smoked for about 26 years.  ?  Has Smoked none for 13 years.  ? ?Social Determinants of Health  ? ?Financial Resource Strain: Not on file  ?Food Insecurity: Not on file  ?Transportation Needs: Not on file  ?Physical Activity: Not on file  ?Stress: Not on file  ?Social Connections: Not on file  ?Intimate Partner Violence: Not on file  ? ?Family History  ?Problem Relation Age of Onset  ?  Diabetes Father   ? Heart disease Father   ? Heart disease Brother 2  ?     Stent at 54--CAD  ? Colon cancer Maternal Uncle   ? Heart disease Paternal Aunt   ? Prostate cancer Paternal Uncle   ? Heart disease Paternal Uncle   ? Esophageal cancer Neg Hx   ? Inflammatory bowel disease Neg Hx   ? Liver disease Neg Hx   ? Pancreatic cancer Neg Hx   ? Rectal cancer Neg Hx   ? Stomach cancer Neg Hx   ? Colon polyps Neg Hx   ? ? ? ? ?Review of Systems  ?All other systems reviewed and are negative. ? ?   ?Objective:  ? Physical Exam ?Vitals reviewed.  ?Constitutional:   ?   General: He is not in acute distress. ?   Appearance: He is well-developed. He is not diaphoretic.  ?HENT:  ?   Head: Normocephalic and atraumatic.  ?   Right Ear: External ear normal.  ?   Left Ear: External ear normal.  ?   Nose: Nose normal.  ?   Mouth/Throat:  ?   Pharynx: No oropharyngeal exudate.  ?Eyes:  ?   General: No  scleral icterus.    ?   Right eye: No discharge.     ?   Left eye: No discharge.  ?   Conjunctiva/sclera: Conjunctivae normal.  ?   Pupils: Pupils are equal, round, and reactive to light.  ?Neck:  ?   Thyroid: No thyromegaly.  ?   Vascular: No JVD.  ?   Trachea: No tracheal deviation.  ?Cardiovascular:  ?   Rate and Rhythm: Normal rate and regular rhythm.  ?   Heart sounds: Normal heart sounds. No murmur heard. ?  No friction rub. No gallop.  ?Pulmonary:  ?   Effort: Pulmonary effort is normal. No respiratory distress.  ?   Breath sounds: Normal breath sounds. No stridor. No wheezing or rales.  ?Chest:  ?   Chest wall: No tenderness.  ?Abdominal:  ?   General: Bowel sounds are normal. There is no distension.  ?   Palpations: Abdomen is soft. There is no mass.  ?   Tenderness: There is no abdominal tenderness. There is no guarding or rebound.  ?Musculoskeletal:     ?   General: No tenderness. Normal range of motion.  ?   Cervical back: Normal range of motion and neck supple.  ?Lymphadenopathy:  ?   Cervical: No cervical adenopathy.  ?Skin: ?   General: Skin is warm.  ?   Coloration: Skin is not pale.  ?   Findings: No erythema or rash.  ?Neurological:  ?   Mental Status: He is alert and oriented to person, place, and time.  ?   Cranial Nerves: No cranial nerve deficit.  ?   Motor: No abnormal muscle tone.  ?   Coordination: Coordination normal.  ?   Deep Tendon Reflexes: Reflexes are normal and symmetric.  ?Psychiatric:     ?   Behavior: Behavior normal.     ?   Thought Content: Thought content normal.     ?   Judgment: Judgment normal.  ? ? ? ? ? ?   ?Assessment & Plan:  ?Controlled type 2 diabetes mellitus without complication, without long-term current use of insulin ([place]) - Plan: CBC with Differential/Platelet, Lipid panel, Microalbumin, urine, Hemoglobin A1c, COMPLETE METABOLIC PANEL WITH GFR ? ?Benign essential HTN ?Blood pressure today is  acceptable.  Check hemoglobin A1c.  If hemoglobin A1c is less than 6.9,  I would recommend dietary changes to try to get his sugar to goal.  If greater than 7 I will replace the metformin with Jardiance.  Patient is comfortable with this plan ?

## 2022-03-07 LAB — LIPID PANEL
Cholesterol: 166 mg/dL (ref <200)
HDL: 38 mg/dL — ABNORMAL LOW (ref >=40)
LDL Cholesterol (Calc): 109 mg/dL (calc) — ABNORMAL HIGH
Non-HDL Cholesterol (Calc): 128 mg/dL (calc) (ref <130)
Total CHOL/HDL Ratio: 4.4 (calc) (ref <5.0)
Triglycerides: 95 mg/dL (ref <150)

## 2022-03-07 LAB — COMPLETE METABOLIC PANEL WITH GFR
AG Ratio: 2.5 (calc) (ref 1.0–2.5)
ALT: 26 U/L (ref 9–46)
AST: 18 U/L (ref 10–35)
Albumin: 4.7 g/dL (ref 3.6–5.1)
Alkaline phosphatase (APISO): 54 U/L (ref 35–144)
BUN: 11 mg/dL (ref 7–25)
CO2: 23 mmol/L (ref 20–32)
Calcium: 9.6 mg/dL (ref 8.6–10.3)
Chloride: 105 mmol/L (ref 98–110)
Creat: 1.15 mg/dL (ref 0.70–1.35)
Globulin: 1.9 g/dL (calc) (ref 1.9–3.7)
Glucose, Bld: 139 mg/dL — ABNORMAL HIGH (ref 65–99)
Potassium: 4.1 mmol/L (ref 3.5–5.3)
Sodium: 138 mmol/L (ref 135–146)
Total Bilirubin: 0.5 mg/dL (ref 0.2–1.2)
Total Protein: 6.6 g/dL (ref 6.1–8.1)
eGFR: 71 mL/min/{1.73_m2} (ref >=60)

## 2022-03-07 LAB — HEMOGLOBIN A1C
Hgb A1c MFr Bld: 6.1 % of total Hgb — ABNORMAL HIGH (ref <5.7)
Mean Plasma Glucose: 128 mg/dL
eAG (mmol/L): 7.1 mmol/L

## 2022-03-07 LAB — CBC WITH DIFFERENTIAL/PLATELET
Absolute Monocytes: 329 cells/uL (ref 200–950)
Basophils Absolute: 37 cells/uL (ref 0–200)
Basophils Relative: 0.7 %
Eosinophils Absolute: 21 cells/uL (ref 15–500)
Eosinophils Relative: 0.4 %
HCT: 47 % (ref 38.5–50.0)
Hemoglobin: 15.2 g/dL (ref 13.2–17.1)
Lymphs Abs: 1855 cells/uL (ref 850–3900)
MCH: 26.9 pg — ABNORMAL LOW (ref 27.0–33.0)
MCHC: 32.3 g/dL (ref 32.0–36.0)
MCV: 83.2 fL (ref 80.0–100.0)
MPV: 10.2 fL (ref 7.5–12.5)
Monocytes Relative: 6.2 %
Neutro Abs: 3058 cells/uL (ref 1500–7800)
Neutrophils Relative %: 57.7 %
Platelets: 266 10*3/uL (ref 140–400)
RBC: 5.65 10*6/uL (ref 4.20–5.80)
RDW: 13.5 % (ref 11.0–15.0)
Total Lymphocyte: 35 %
WBC: 5.3 10*3/uL (ref 3.8–10.8)

## 2022-03-07 LAB — MICROALBUMIN, URINE: Microalb, Ur: 0.2 mg/dL

## 2022-04-22 ENCOUNTER — Other Ambulatory Visit: Payer: Self-pay | Admitting: Family Medicine

## 2022-04-23 NOTE — Telephone Encounter (Signed)
Requested Prescriptions  Pending Prescriptions Disp Refills  . losartan (COZAAR) 100 MG tablet [Pharmacy Med Name: Losartan Potassium 100 MG Oral Tablet] 90 tablet 1    Sig: TAKE 1 TABLET BY MOUTH  DAILY     Cardiovascular:  Angiotensin Receptor Blockers Passed - 04/22/2022  9:11 AM      Passed - Cr in normal range and within 180 days    Creat  Date Value Ref Range Status  03/06/2022 1.15 0.70 - 1.35 mg/dL Final         Passed - K in normal range and within 180 days    Potassium  Date Value Ref Range Status  03/06/2022 4.1 3.5 - 5.3 mmol/L Final         Passed - Patient is not pregnant      Passed - Last BP in normal range    BP Readings from Last 1 Encounters:  03/06/22 130/82         Passed - Valid encounter within last 6 months    Recent Outpatient Visits          1 month ago Controlled type 2 diabetes mellitus without complication, without long-term current use of insulin ([place])   [facility], Cammie Mcgee, MD   4 months ago Ingrown toenail of right foot   [place], Cammie Mcgee, MD   4 months ago General medical exam   [place] Susy Frizzle, MD   8 months ago Ingrown toenail of left foot   [place], Cammie Mcgee, MD   12 months ago Right wrist pain   [place], Cammie Mcgee, MD      Future Appointments            In 4 months [place], Cammie Mcgee, MD [place]

## 2022-05-11 ENCOUNTER — Encounter: Payer: Self-pay | Admitting: Family Medicine

## 2022-06-04 ENCOUNTER — Other Ambulatory Visit: Payer: Self-pay | Admitting: Family Medicine

## 2022-06-04 DIAGNOSIS — E118 Type 2 diabetes mellitus with unspecified complications: Secondary | ICD-10-CM

## 2022-06-07 ENCOUNTER — Ambulatory Visit (INDEPENDENT_AMBULATORY_CARE_PROVIDER_SITE_OTHER): Payer: Medicare Other | Admitting: Family Medicine

## 2022-06-07 VITALS — BP 132/72 | HR 69 | Temp 99.0°F | Ht 69.0 in | Wt 208.0 lb

## 2022-06-07 DIAGNOSIS — L929 Granulomatous disorder of the skin and subcutaneous tissue, unspecified: Secondary | ICD-10-CM

## 2022-06-07 MED ORDER — OLOPATADINE HCL 0.1 % OP SOLN: 1.0000 [drp] | Freq: Two times a day (BID) | OPHTHALMIC | 12 refills | Status: DC

## 2022-06-07 NOTE — Progress Notes (Signed)
Subjective:    Patient ID: Nathaniel Smith, male    DOB: Jan 29, 1956, 66 y.o.   MRN: [id number]  HPI 08/04/21 Patient is a very pleasant 66 year old African-American gentleman who presents today with an ingrown toenail on his left foot.  The great toe of the left foot has a medial nail fold that is ingrown.  The surrounding skin is erythematous and tender to palpation and is bleeding at the very tip of the toe.  He is requesting that I remove the ingrown portion of the toenail.  At that time my plan was: The toe was anesthetized 0.1% lidocaine without epinephrine. A tourniquet was then applied to the base of the toe. The lateral nail edge was then separated from the underlying nail bed using a metal elevator. A longitudinal cut was then made using a pair of scissors all the way back to the proximal nail fold. Using a pair of hemostats, the ingrown portion of the nail was removed with simple traction. Neosporin was then applied to the wound bed, the toe was wrapped with petroleum gauze and Coban. Tourniquet was removed. There was no significant blood loss. Patient tolerated the procedure without complications. He was given a one-time prescription of hydrocodone/acetaminophen 5/325 one by mouth every 6 hours when necessary pain. He was given 10 tablets.  12/20/21 Patient complains of pain in his right great toe.  The lateral nail fold has grown down into the underlying nailbed.  The skin adjacent to the lateral nail edge is sore and swollen and tender to touch.  He is requesting that I removed that portion of the ingrown nail and perform a matrixectomy to ensure that the nail does not grow back.  At that time, my plan was:  The toe was anesthetized 0.1% lidocaine without epinephrine. A tourniquet was then applied to the base of the toe. The lateral nail edge was then separated from the underlying nail bed using a metal elevator. A longitudinal cut was then made using a pair of scissors all the way back to  the proximal nail fold. Using a pair of hemostats, the ingrown portion of the nail was removed with simple traction.  Neosporin was applied to the surrounding tissue.  Using a Q-tip soaked in phenol, I performed a matrixectomy underneath the proximal nail fold applying the phenol for a total of 30 seconds.  This was repeated x3.  Phenol was then cleansed using copious saturation with isopropyl alcohol.  Neosporin was then applied to the wound bed, the toe was wrapped with petroleum gauze and Coban. Tourniquet was removed. There was no significant blood loss. Patient tolerated the procedure without complications. He was given a one-time prescription of hydrocodone/acetaminophen 5/325 one by mouth every 6 hours when necessary pain. He was given 10 tablets.  06/07/22 At the base of the toenail, on the lateral margin, there is a small red nodular lesion that is granulation tissue.  The patient states that it bleeds occasionally.  It does not cause him pain.  This is most likely granulation tissue stemming from the trauma where I removed his toenail and performed a matrixectomy.  Pink circle represents 3 mm nodular pink cervical Past Medical History:  Diagnosis Date   Allergy    Cancer ([place]) 07/02/10   prostate   Hyperlipidemia 11/26/2009   Hypertension 11/26/2010   Pyrosis    Past Surgical History:  Procedure Laterality Date   CARPAL TUNNEL RELEASE Right 10/22/2006   [facility]   COLONOSCOPY  POLYPECTOMY     PROSTATE SURGERY  09/2010   seeds implant   UPPER GASTROINTESTINAL ENDOSCOPY     Current Outpatient Medications on File Prior to Visit  Medication Sig Dispense Refill   amLODipine (NORVASC) 10 MG tablet TAKE 1 TABLET BY MOUTH  DAILY 90 tablet 3   atorvastatin (LIPITOR) 20 MG tablet TAKE 1 TABLET BY MOUTH  DAILY 90 tablet 3   Ferrous Sulfate (IRON) 28 MG TABS Take by mouth.     HYDROcodone-acetaminophen (NORCO) 5-325 MG tablet Take 1 tablet by mouth every 6 (six) hours as needed for  moderate pain. (Patient not taking: Reported on 03/06/2022) 10 tablet 0   losartan (COZAAR) 100 MG tablet TAKE 1 TABLET BY MOUTH  DAILY 90 tablet 1   Magnesium 500 MG TABS Take by mouth.     metFORMIN (GLUCOPHAGE-XR) 500 MG 24 hr tablet Take 2 tablets (1,000 mg total) by mouth daily with breakfast. 60 tablet 5   Multiple Vitamin (MULTIVITAMIN) tablet Take 1 tablet by mouth daily.     No current facility-administered medications on file prior to visit.   No Known Allergies Social History   Socioeconomic History   Marital status: Married    Spouse name: Not on file   Number of children: 1   Years of education: Not on file   Highest education level: Not on file  Occupational History   Not on file  Tobacco Use   Smoking status: Former   Smokeless tobacco: Never   Tobacco comments:    2001 quit  Vaping Use   Vaping Use: Never used  Substance and Sexual Activity   Alcohol use: No   Drug use: No   Sexual activity: Yes    Birth control/protection: Condom  Other Topics Concern   Not on file  Social History Narrative   He drives dump trucks. Hauls rock, dirt, etc.   Divorced. Has one son and one stepdaughter.   :[place]ks on  [place] 1 mile every morning Monday through Friday.   He smoked 1/2 pack a day starting at age 60 and quit at age 18.--smoked for about 26 years.    Has Smoked none for 13 years.   Social Determinants of Health   Financial Resource Strain: Not on file  Food Insecurity: Not on file  Transportation Needs: Not on file  Physical Activity: Not on file  Stress: Not on file  Social Connections: Not on file  Intimate Partner Violence: Not on file      Review of Systems  All other systems reviewed and are negative.      Objective:   Physical Exam Vitals reviewed.  Cardiovascular:     Rate and Rhythm: Normal rate and regular rhythm.     Heart sounds: Normal heart sounds.  Pulmonary:     Effort: Pulmonary effort is normal. No respiratory  distress.     Breath sounds: Normal breath sounds. No wheezing or rales.  Musculoskeletal:       Feet:        Assessment & Plan:  Excessive granulation tissue I explained to the patient that this is likely a reaction to the surgery that I performed to remove the toenail and perform matrixectomy.  Unfortunately it will not likely resolve on its own.  I offered the patient chemical cautery with silver nitrate vs electrocautery.  Patient states that it does not hurt or bother him he just was concerned that it may be an infection.  Therefore he elects  just to monitor the area.  He does have some drainage coming from his left thigh.  They are clear tears.  Ophthalmology says he has chronic dry eyes and recommended Systane.  Patient would like to try allergy drops.  I explained to the patient I do not feel that this is due to an allergy but I am fine with him trying Patanol

## 2022-06-16 ENCOUNTER — Other Ambulatory Visit: Payer: Self-pay | Admitting: Family Medicine

## 2022-06-16 DIAGNOSIS — R739 Hyperglycemia, unspecified: Secondary | ICD-10-CM

## 2022-06-18 NOTE — Telephone Encounter (Signed)
Requested Prescriptions  Pending Prescriptions Disp Refills  . metFORMIN (GLUCOPHAGE-XR) 500 MG 24 hr tablet [Pharmacy Med Name: METFORMIN ER $RemoveBefo'500MG'gObxyamscwL$  24HR TABS] 180 tablet 0    Sig: TAKE 2 TABLETS(1000 MG) BY MOUTH DAILY WITH BREAKFAST     Endocrinology:  Diabetes - Biguanides Failed - 06/16/2022  3:36 AM      Failed - B12 Level in normal range and within 720 days    No results found for: "VITAMINB12"       Passed - Cr in normal range and within 360 days    Creat  Date Value Ref Range Status  03/06/2022 1.15 0.70 - 1.35 mg/dL Final         Passed - HBA1C is between 0 and 7.9 and within 180 days    Hgb A1c MFr Bld  Date Value Ref Range Status  03/06/2022 6.1 (H) <5.7 % of total Hgb Final    Comment:    For someone without known diabetes, a hemoglobin  A1c value between 5.7% and 6.4% is consistent with prediabetes and should be confirmed with a  follow-up test. . For someone with known diabetes, a value <7% indicates that their diabetes is well controlled. A1c targets should be individualized based on duration of diabetes, age, comorbid conditions, and other considerations. . This assay result is consistent with an increased risk of diabetes. . Currently, no consensus exists regarding use of hemoglobin A1c for diagnosis of diabetes for children. .          Passed - eGFR in normal range and within 360 days    GFR, Est African American  Date Value Ref Range Status  11/29/2020 78 > OR = 60 mL/min/1.63m2 Final   GFR, Est Non African American  Date Value Ref Range Status  11/29/2020 67 > OR = 60 mL/min/1.43m2 Final   eGFR  Date Value Ref Range Status  03/06/2022 71 > OR = 60 mL/min/1.27m2 Final    Comment:    The eGFR is based on the CKD-EPI 2021 equation. To calculate  the new eGFR from a previous Creatinine or Cystatin C result, go to https://www.kidney.org/professionals/ kdoqi/gfr%5Fcalculator          Passed - Valid encounter within last 6 months    Recent  Outpatient Visits          3 months ago Controlled type 2 diabetes mellitus without complication, without long-term current use of insulin ([place])   [place], Cammie Mcgee, MD   6 months ago Ingrown toenail of right foot   [place], Cammie Mcgee, MD   6 months ago General medical exam   [place] Susy Frizzle, MD   10 months ago Ingrown toenail of left foot   [place], Cammie Mcgee, MD   1 year ago Right wrist pain   [place], Cammie Mcgee, MD      Future Appointments            In 2 months [place], Cammie Mcgee, MD [place]           Passed - CBC within normal limits and completed in the last 12 months    WBC  Date Value Ref Range Status  03/06/2022 5.3 3.8 - 10.8 Thousand/uL Final   RBC  Date Value Ref Range Status  03/06/2022 5.65 4.20 - 5.80 Million/uL Final   Hemoglobin  Date Value Ref Range Status  03/06/2022 15.2 13.2 - 17.1 g/dL Final   HCT  Date Value Ref Range Status  03/06/2022 47.0 38.5 - 50.0 % Final   MCHC  Date Value Ref Range Status  03/06/2022 32.3 32.0 - 36.0 g/dL Final   [facility]  Date Value Ref Range Status  03/06/2022 26.9 (L) 27.0 - 33.0 pg Final   MCV  Date Value Ref Range Status  03/06/2022 83.2 80.0 - 100.0 fL Final   No results found for: "PLTCOUNTKUC", "LABPLAT", "POCPLA" RDW  Date Value Ref Range Status  03/06/2022 13.5 11.0 - 15.0 % Final

## 2022-07-05 ENCOUNTER — Ambulatory Visit (HOSPITAL_COMMUNITY)
Admission: RE | Admit: 2022-07-05 | Discharge: 2022-07-05 | Disposition: A | Discharge status: Home / Self Care | Payer: Medicare Other | Source: Ambulatory Visit | Attending: Family Medicine | Admitting: Family Medicine

## 2022-07-05 DIAGNOSIS — E118 Type 2 diabetes mellitus with unspecified complications: Secondary | ICD-10-CM | POA: Insufficient documentation

## 2022-07-11 ENCOUNTER — Telehealth: Payer: Self-pay | Admitting: Orthopaedic Surgery

## 2022-07-11 NOTE — Telephone Encounter (Signed)
Pt calling because pain is getting worse in hand and 3 fingers are now numb. He is now free to come in this Friday if there was a way to work him in to get relief before the weekend.   Cb [phone]

## 2022-07-12 ENCOUNTER — Ambulatory Visit (INDEPENDENT_AMBULATORY_CARE_PROVIDER_SITE_OTHER): Payer: Medicare Other | Admitting: Orthopaedic Surgery

## 2022-07-12 ENCOUNTER — Ambulatory Visit: Payer: Medicare Other | Admitting: Orthopaedic Surgery

## 2022-07-12 ENCOUNTER — Encounter: Payer: Self-pay | Admitting: Orthopaedic Surgery

## 2022-07-12 DIAGNOSIS — G5602 Carpal tunnel syndrome, left upper limb: Secondary | ICD-10-CM | POA: Diagnosis not present

## 2022-07-12 NOTE — Progress Notes (Signed)
Office Visit Note   Patient: Nathaniel Smith           Date of Birth: September 02, 1956           MRN: [id number] Visit Date: 07/12/2022              Requested by: Susy Frizzle, MD [address] [place],  [place] [address] PCP: Susy Frizzle, MD   Assessment & Plan: Visit Diagnoses:  1. Carpal tunnel syndrome, left upper limb     Plan: Wrist splint applied.  [place] that he can wear it at night and also when he is driving.  Recheck 5 weeks.  Patient states he like to put off  left carpal tunnel release until Christmas time when work becomes very slow.  Follow-Up Instructions: No follow-ups on file.   Orders:  No orders of the defined types were placed in this encounter.  No orders of the defined types were placed in this encounter.     Procedures: No procedures performed   Clinical Data: No additional findings.   Subjective: Chief Complaint  Patient presents with   Left Hand - Numbness    HPI 66 year old male seen with numbness in his left hand median distribution that is waking him up at night.  He has had previous carpal tunnel release on his opposite right hand after failed splinting and injections.  Electrical test were consistent with bilateral carpal tunnel syndrome and he states left hand had been bothering him until the last several months and is getting worse.  He states the left hand at times would come and go.  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  Patient denies neck pain.  Review of Systems negative for diabetes.   Objective: Vital Signs: Ht '5\' 9"'$  (1.753 m)   Wt 205 lb (93 kg)   BMI 30.27 kg/m   Physical Exam Constitutional:      Appearance: He is well-developed.  HENT:     Head: Normocephalic and atraumatic.     Right Ear: External ear normal.     Left Ear: External ear normal.  Eyes:     Pupils: Pupils are equal, round, and reactive to light.  Neck:     Thyroid: No thyromegaly.      Trachea: No tracheal deviation.  Cardiovascular:     Rate and Rhythm: Normal rate.  Pulmonary:     Effort: Pulmonary effort is normal.     Breath sounds: No wheezing.  Abdominal:     General: Bowel sounds are normal.     Palpations: Abdomen is soft.  Musculoskeletal:     Cervical back: Neck supple.  Skin:    General: Skin is warm and dry.     Capillary Refill: Capillary refill takes less than 2 seconds.  Neurological:     Mental Status: He is alert and oriented to person, place, and time.  Psychiatric:        Behavior: Behavior normal.        Thought Content: Thought content normal.        Judgment: Judgment normal.     Ortho Exam positive carpal compression test on the left positive Phalen's.  No thenar atrophy.  Normal sensation left ulnar one half fingers.  Decreased two-point left median distribution.  Well-healed right hand carpal tunnel scar with normal thenar strength and normal sensation to the right fingers.  Full cervical range of motion no brachial  plexus tenderness.  Specialty Comments:  No specialty comments available.  Imaging: No results found.   PMFS History: Patient Active Problem List   Diagnosis Date Noted   Carpal tunnel syndrome, left upper limb 07/12/2022   Tendinitis, de Quervain's 06/01/2021   Gastroesophageal reflux disease 07/01/2018   Pyrosis 07/01/2018   Atypical chest pain 07/01/2018   History of colonoscopy 07/01/2018   Hemorrhoids 07/01/2018   Family history of premature coronary artery disease 12/16/2013   Hypertension 11/26/2010   Cancer ([place]) 07/02/2010   Hyperlipidemia 11/26/2009   Past Medical History:  Diagnosis Date   Allergy    Cancer ([place]) 07/02/10   prostate   Hyperlipidemia 11/26/2009   Hypertension 11/26/2010   Pyrosis     Family History  Problem Relation Age of Onset   Diabetes Father    Heart disease Father    Heart disease Brother 55       Stent at 54--CAD   Colon cancer Maternal Uncle    Heart disease  Paternal Aunt    Prostate cancer Paternal Uncle    Heart disease Paternal Uncle    Esophageal cancer Neg Hx    Inflammatory bowel disease Neg Hx    Liver disease Neg Hx    Pancreatic cancer Neg Hx    Rectal cancer Neg Hx    Stomach cancer Neg Hx    Colon polyps Neg Hx     Past Surgical History:  Procedure Laterality Date   CARPAL TUNNEL RELEASE Right 10/22/2006   [facility]   COLONOSCOPY     POLYPECTOMY     PROSTATE SURGERY  09/2010   seeds implant   UPPER GASTROINTESTINAL ENDOSCOPY     Social History   Occupational History   Not on file  Tobacco Use   Smoking status: Former   Smokeless tobacco: Never   Tobacco comments:    2001 quit  Vaping Use   Vaping Use: Never used  Substance and Sexual Activity   Alcohol use: No   Drug use: No   Sexual activity: Yes    Birth control/protection: Condom

## 2022-07-12 NOTE — Telephone Encounter (Signed)
No appointment available for Friday. Patient is coming to [facility] office to be seen this afternoon.

## 2022-07-20 ENCOUNTER — Ambulatory Visit: Payer: Medicare Other | Admitting: Orthopaedic Surgery

## 2022-08-09 DIAGNOSIS — H04223 Epiphora due to insufficient drainage, bilateral lacrimal glands: Secondary | ICD-10-CM | POA: Diagnosis not present

## 2022-08-16 ENCOUNTER — Encounter: Payer: Self-pay | Admitting: Orthopaedic Surgery

## 2022-08-16 ENCOUNTER — Ambulatory Visit: Payer: Medicare Other | Admitting: Orthopaedic Surgery

## 2022-08-16 VITALS — Ht 69.0 in | Wt 205.0 lb

## 2022-08-16 DIAGNOSIS — M654 Radial styloid tenosynovitis [de Quervain]: Secondary | ICD-10-CM

## 2022-08-16 DIAGNOSIS — G5602 Carpal tunnel syndrome, left upper limb: Secondary | ICD-10-CM

## 2022-08-16 MED ORDER — LIDOCAINE HCL 1 % IJ SOLN
1.0000 mL | INTRAMUSCULAR | Status: AC | PRN
  Administered 2022-08-16: 1 mL

## 2022-08-16 MED ORDER — METHYLPREDNISOLONE ACETATE 40 MG/ML IJ SUSP
40.0000 mg | INTRAMUSCULAR | Status: AC | PRN
  Administered 2022-08-16: 40 mg

## 2022-08-16 MED ORDER — BUPIVACAINE HCL 0.25 % IJ SOLN
1.0000 mL | INTRAMUSCULAR | Status: AC | PRN
  Administered 2022-08-16: 1 mL

## 2022-08-16 NOTE — Progress Notes (Signed)
Office Visit Note   Patient: Nathaniel Smith           Date of Birth: 12-26-1955           MRN: [id number] Visit Date: 08/16/2022              Requested by: Susy Frizzle, MD [address] [place],  [place] [id number] PCP: Susy Frizzle, MD   Assessment & Plan: Visit Diagnoses:  1. Tendinitis, de Quervain's   2. Carpal tunnel syndrome, left upper limb     Plan: Right first dorsal compartment injected.  We reviewed splinting again.  He like to have his left carpal tunnel release scheduled in December when work is slow.  He will not require preoperative medical clearance he has been healthy and active with well-controlled hypertension with medication. Pathophysiology discussed for first dorsal compartment stenosing tenosynovitis right wrist.  We discussed the carpal tunnel release postop care, sutures, like time out of work etc.  Decision for surgery made for left carpal tunnel release.  Follow-Up Instructions: No follow-ups on file.   Orders:  Orders Placed This Encounter  Procedures   Hand/UE Inj   No orders of the defined types were placed in this encounter.     Procedures: Hand/UE Inj: R extensor compartment 1 for de Quervain's tenosynovitis on 08/16/2022 1:50 PM Medications: 1 mL lidocaine 1 %; 1 mL bupivacaine 0.25 %; 40 mg methylPREDNISolone acetate 40 MG/ML      Clinical Data: No additional findings.   Subjective: Chief Complaint  Patient presents with   Left Hand - Follow-up, Numbness   Right Hand - Pain    HPI 66 year old male returns with ongoing problems with left carpal tunnel syndrome.  He has had previous right carpal tunnel release years ago doing well.  He is put up with his left carpal tunnel states now is getting more painful despite splinting activity modification.  He drives a truck.  His other problem is been right first dorsal compartment stenosing tenosynovitis which was injected last year in August.  Now 14 months later he has  had recurrence of symptoms on the right wrist and is requesting repeat injection.  Patient's wife is present with him again today  Review of Systems updated unchanged.   Objective: Vital Signs: Ht '5\' 9"'$  (1.753 m)   Wt 205 lb (93 kg)   BMI 30.27 kg/m   Physical Exam Constitutional:      Appearance: He is well-developed.  HENT:     Head: Normocephalic and atraumatic.     Right Ear: External ear normal.     Left Ear: External ear normal.  Eyes:     Pupils: Pupils are equal, round, and reactive to light.  Neck:     Thyroid: No thyromegaly.     Trachea: No tracheal deviation.  Cardiovascular:     Rate and Rhythm: Normal rate.  Pulmonary:     Effort: Pulmonary effort is normal.     Breath sounds: No wheezing.  Abdominal:     General: Bowel sounds are normal.     Palpations: Abdomen is soft.  Musculoskeletal:     Cervical back: Neck supple.  Skin:    General: Skin is warm and dry.     Capillary Refill: Capillary refill takes less than 2 seconds.  Neurological:     Mental Status: He is alert and oriented to person, place, and time.  Psychiatric:        Behavior: Behavior normal.  Thought Content: Thought content normal.        Judgment: Judgment normal.     Ortho Exam positive Finkelstein test right.  Positive carpal compression test left well-healed carpal tunnel incision on the right wrist.  Good cervical range of motion negative Spurling elbows reach full extension.  Trace thenar weakness on the right normal thenar strength on the left.  Positive Phalen's test left.  Specialty Comments:  No specialty comments available.  Imaging: No results found.   PMFS History: Patient Active Problem List   Diagnosis Date Noted   Carpal tunnel syndrome, left upper limb 07/12/2022   Tendinitis, de Quervain's 06/01/2021   Gastroesophageal reflux disease 07/01/2018   Pyrosis 07/01/2018   Atypical chest pain 07/01/2018   History of colonoscopy 07/01/2018   Hemorrhoids  07/01/2018   Family history of premature coronary artery disease 12/16/2013   Hypertension 11/26/2010   Cancer ([place]) 07/02/2010   Hyperlipidemia 11/26/2009   Past Medical History:  Diagnosis Date   Allergy    Cancer ([place]) 07/02/10   prostate   Hyperlipidemia 11/26/2009   Hypertension 11/26/2010   Pyrosis     Family History  Problem Relation Age of Onset   Diabetes Father    Heart disease Father    Heart disease Brother 53       Stent at 54--CAD   Colon cancer Maternal Uncle    Heart disease Paternal Aunt    Prostate cancer Paternal Uncle    Heart disease Paternal Uncle    Esophageal cancer Neg Hx    Inflammatory bowel disease Neg Hx    Liver disease Neg Hx    Pancreatic cancer Neg Hx    Rectal cancer Neg Hx    Stomach cancer Neg Hx    Colon polyps Neg Hx     Past Surgical History:  Procedure Laterality Date   CARPAL TUNNEL RELEASE Right 10/22/2006   [facility]   COLONOSCOPY     POLYPECTOMY     PROSTATE SURGERY  09/2010   seeds implant   UPPER GASTROINTESTINAL ENDOSCOPY     Social History   Occupational History   Not on file  Tobacco Use   Smoking status: Former   Smokeless tobacco: Never   Tobacco comments:    2001 quit  Vaping Use   Vaping Use: Never used  Substance and Sexual Activity   Alcohol use: No   Drug use: No   Sexual activity: Yes    Birth control/protection: Condom

## 2022-09-07 ENCOUNTER — Encounter: Payer: Self-pay | Admitting: Family Medicine

## 2022-09-07 ENCOUNTER — Ambulatory Visit (INDEPENDENT_AMBULATORY_CARE_PROVIDER_SITE_OTHER): Payer: Medicare Other | Admitting: Family Medicine

## 2022-09-07 VITALS — BP 136/86 | HR 85 | Ht 69.0 in | Wt 199.0 lb

## 2022-09-07 DIAGNOSIS — E118 Type 2 diabetes mellitus with unspecified complications: Secondary | ICD-10-CM | POA: Diagnosis not present

## 2022-09-07 NOTE — Progress Notes (Signed)
Subjective:    Patient ID: Nathaniel Smith, male    DOB: 19-Feb-1956, 66 y.o.   MRN: [id number]  HPI  Patient is here today for a follow-up of his diabetes.  He is currently on metformin.  He takes 1 tablet twice a day.  He does have some diarrhea with it but he can tolerate it.  His blood pressure is excellent.  He denies any chest pain shortness of breath or dyspnea on exertion.  He denies any severe neuropathy in the feet although he does occasionally get some numbness.  He denies any polyuria or polydipsia.  Diabetic foot exam was performed today and is normal Past Medical History:  Diagnosis Date   Allergy    Cancer ([place]) 07/02/10   prostate   Hyperlipidemia 11/26/2009   Hypertension 11/26/2010   Pyrosis    Past Surgical History:  Procedure Laterality Date   CARPAL TUNNEL RELEASE Right 10/22/2006    [facility]th[facility]   COLONOSCOPY     POLYPECTOMY     PROSTATE SURGERY  09/2010   seeds implant   UPPER GASTROINTESTINAL ENDOSCOPY     Current Outpatient Medications on File Prior to Visit  Medication Sig Dispense Refill   amLODipine (NORVASC) 10 MG tablet TAKE 1 TABLET BY MOUTH  DAILY 90 tablet 3   atorvastatin (LIPITOR) 20 MG tablet TAKE 1 TABLET BY MOUTH  DAILY 90 tablet 3   Ferrous Sulfate (IRON) 28 MG TABS Take by mouth.     HYDROcodone-acetaminophen (NORCO) 5-325 MG tablet Take 1 tablet by mouth every 6 (six) hours as needed for moderate pain. (Patient not taking: Reported on 06/07/2022) 10 tablet 0   losartan (COZAAR) 100 MG tablet TAKE 1 TABLET BY MOUTH  DAILY 90 tablet 1   Magnesium 500 MG TABS Take by mouth.     metFORMIN (GLUCOPHAGE-XR) 500 MG 24 hr tablet TAKE 2 TABLETS(1000 MG) BY MOUTH DAILY WITH BREAKFAST 180 tablet 0   Multiple Vitamin (MULTIVITAMIN) tablet Take 1 tablet by mouth daily.     olopatadine (PATANOL) 0.1 % ophthalmic solution Place 1 drop into both eyes 2 (two) times daily. 5 mL 12   No current facility-administered medications on file prior to visit.    No Known Allergies Social History   Socioeconomic History   Marital status: Married    Spouse name: Not on file   Number of children: 1   Years of education: Not on file   Highest education level: Not on file  Occupational History   Not on file  Tobacco Use   Smoking status: Former   Smokeless tobacco: Never   Tobacco comments:    2001 quit  Vaping Use   Vaping Use: Never used  Substance and Sexual Activity   Alcohol use: No   Drug use: No   Sexual activity: Yes    Birth control/protection: Condom  Other Topics Concern   Not on file  Social History Narrative   He drives dump trucks. Hauls rock, dirt, etc.   Divorced. Has one son and one stepdaughter.   :Walks on  [place] 1 mile every morning Monday through Friday.   He smoked 1/2 pack a day starting at age 14 and quit at age 83.--smoked for about 26 years.    Has Smoked none for 13 years.   Social Determinants of Health   Financial Resource Strain: Not on file  Food Insecurity: Not on file  Transportation Needs: Not on file  Physical Activity: Not on file  Stress: Not on file  Social Connections: Not on file  Intimate Partner Violence: Not on file   Family History  Problem Relation Age of Onset   Diabetes Father    Heart disease Father    Heart disease Brother 71       Stent at 54--CAD   Colon cancer Maternal Uncle    Heart disease Paternal Aunt    Prostate cancer Paternal Uncle    Heart disease Paternal Uncle    Esophageal cancer Neg Hx    Inflammatory bowel disease Neg Hx    Liver disease Neg Hx    Pancreatic cancer Neg Hx    Rectal cancer Neg Hx    Stomach cancer Neg Hx    Colon polyps Neg Hx       Review of Systems  All other systems reviewed and are negative.      Objective:   Physical Exam Vitals reviewed.  Constitutional:      General: He is not in acute distress.    Appearance: He is well-developed. He is not diaphoretic.  HENT:     Head: Normocephalic and atraumatic.      Right Ear: External ear normal.     Left Ear: External ear normal.     Nose: Nose normal.     Mouth/Throat:     Pharynx: No oropharyngeal exudate.  Eyes:     General: No scleral icterus.       Right eye: No discharge.        Left eye: No discharge.     Conjunctiva/sclera: Conjunctivae normal.     Pupils: Pupils are equal, round, and reactive to light.  Neck:     Thyroid: No thyromegaly.     Vascular: No JVD.     Trachea: No tracheal deviation.  Cardiovascular:     Rate and Rhythm: Normal rate and regular rhythm.     Heart sounds: Normal heart sounds. No murmur heard.    No friction rub. No gallop.  Pulmonary:     Effort: Pulmonary effort is normal. No respiratory distress.     Breath sounds: Normal breath sounds. No stridor. No wheezing or rales.  Chest:     Chest wall: No tenderness.  Abdominal:     General: Bowel sounds are normal. There is no distension.     Palpations: Abdomen is soft. There is no mass.     Tenderness: There is no abdominal tenderness. There is no guarding or rebound.  Musculoskeletal:        General: No tenderness. Normal range of motion.     Cervical back: Normal range of motion and neck supple.  Lymphadenopathy:     Cervical: No cervical adenopathy.  Skin:    General: Skin is warm.     Coloration: Skin is not pale.     Findings: No erythema or rash.  Neurological:     Mental Status: He is alert and oriented to person, place, and time.     Cranial Nerves: No cranial nerve deficit.     Motor: No abnormal muscle tone.     Coordination: Coordination normal.     Deep Tendon Reflexes: Reflexes are normal and symmetric.  Psychiatric:        Behavior: Behavior normal.        Thought Content: Thought content normal.        Judgment: Judgment normal.           Assessment & Plan:  Controlled type 2 diabetes mellitus with  complication, without long-term current use of insulin (HCC) - Plan: Lipid panel, COMPLETE METABOLIC PANEL WITH GFR,  Hemoglobin A1c, Protein / Creatinine Ratio, Urine Blood pressure today is excellent.  I will check a CMP and lipid panel and A1c and a urine protein to creatinine ratio.  Goal LDL cholesterol is less than 100.  Goal A1c is less than 6.5.  Blood pressure today is at goal.  His flu shot and his COVID shot are up-to-date

## 2022-09-08 LAB — COMPLETE METABOLIC PANEL WITH GFR
AG Ratio: 1.9 (calc) (ref 1.0–2.5)
ALT: 17 U/L (ref 9–46)
AST: 13 U/L (ref 10–35)
Albumin: 4.4 g/dL (ref 3.6–5.1)
Alkaline phosphatase (APISO): 51 U/L (ref 35–144)
BUN: 9 mg/dL (ref 7–25)
CO2: 22 mmol/L (ref 20–32)
Calcium: 9.8 mg/dL (ref 8.6–10.3)
Chloride: 105 mmol/L (ref 98–110)
Creat: 1.06 mg/dL (ref 0.70–1.35)
Globulin: 2.3 g/dL (calc) (ref 1.9–3.7)
Glucose, Bld: 121 mg/dL — ABNORMAL HIGH (ref 65–99)
Potassium: 4 mmol/L (ref 3.5–5.3)
Sodium: 138 mmol/L (ref 135–146)
Total Bilirubin: 0.4 mg/dL (ref 0.2–1.2)
Total Protein: 6.7 g/dL (ref 6.1–8.1)
eGFR: 77 mL/min/{1.73_m2} (ref >=60)

## 2022-09-08 LAB — LIPID PANEL
Cholesterol: 152 mg/dL
HDL: 50 mg/dL
LDL Cholesterol (Calc): 82 mg/dL
Non-HDL Cholesterol (Calc): 102 mg/dL
Total CHOL/HDL Ratio: 3 (calc)
Triglycerides: 105 mg/dL

## 2022-09-08 LAB — PROTEIN / CREATININE RATIO, URINE
Creatinine, Urine: 125 mg/dL (ref 20–320)
Total Protein, Urine: <4 mg/dL — ABNORMAL LOW (ref 5–25)

## 2022-09-08 LAB — HEMOGLOBIN A1C
Hgb A1c MFr Bld: 6.2 % of total Hgb — ABNORMAL HIGH (ref <5.7)
Mean Plasma Glucose: 131 mg/dL
eAG (mmol/L): 7.3 mmol/L

## 2022-09-10 ENCOUNTER — Encounter: Payer: Self-pay | Admitting: Family Medicine

## 2022-09-10 ENCOUNTER — Telehealth: Payer: Medicare Other | Admitting: Family Medicine

## 2022-09-10 DIAGNOSIS — B9789 Other viral agents as the cause of diseases classified elsewhere: Secondary | ICD-10-CM | POA: Diagnosis not present

## 2022-09-10 DIAGNOSIS — J019 Acute sinusitis, unspecified: Secondary | ICD-10-CM | POA: Diagnosis not present

## 2022-09-10 MED ORDER — ALBUTEROL SULFATE HFA 108 (90 BASE) MCG/ACT IN AERS: 1.0000 | INHALATION_SPRAY | Freq: Four times a day (QID) | RESPIRATORY_TRACT | 0 refills | Status: DC | PRN

## 2022-09-10 MED ORDER — FLUTICASONE PROPIONATE 50 MCG/ACT NA SUSP: 2.0000 | Freq: Every day | NASAL | 0 refills | Status: DC

## 2022-09-10 MED ORDER — BENZONATATE 100 MG PO CAPS: 200.0000 mg | ORAL_CAPSULE | Freq: Two times a day (BID) | ORAL | 0 refills | Status: DC | PRN

## 2022-09-10 NOTE — Progress Notes (Signed)
Virtual Visit Consent   Nathaniel Smith, you are scheduled for a virtual visit with a [facility] provider today. Just as with appointments in the office, your consent must be obtained to participate. Your consent will be active for this visit and any virtual visit you may have with one of our providers in the next 365 days. If you have a MyChart account, a copy of this consent can be sent to you electronically.  As this is a virtual visit, video technology does not allow for your provider to perform a traditional examination. This may limit your provider's ability to fully assess your condition. If your provider identifies any concerns that need to be evaluated in person or the need to arrange testing (such as labs, EKG, etc.), we will make arrangements to do so. Although advances in technology are sophisticated, we cannot ensure that it will always work on either your end or our end. If the connection with a video visit is poor, the visit may have to be switched to a telephone visit. With either a video or telephone visit, we are not always able to ensure that we have a secure connection.  By engaging in this virtual visit, you consent to the provision of healthcare and authorize for your insurance to be billed (if applicable) for the services provided during this visit. Depending on your insurance coverage, you may receive a charge related to this service.  I need to obtain your verbal consent now. Are you willing to proceed with your visit today? Nathaniel Smith has provided verbal consent on 09/10/2022 for a virtual visit (video or telephone). Perlie Mayo, NP  Date: 09/10/2022 3:45 PM  Virtual Visit via Video Note   I, Perlie Mayo, connected with  Nathaniel Smith  ([id number], 1956-10-12) on 09/10/22 at  3:45 PM EST by a video-enabled telemedicine application and verified that I am speaking with the correct person using two identifiers.  Location: Patient: Virtual Visit Location  Patient: Home Provider: Virtual Visit Location Provider: Home Office   I discussed the limitations of evaluation and management by telemedicine and the availability of in person appointments. The patient expressed understanding and agreed to proceed.    History of Present Illness: Nathaniel Smith is a 66 y.o. who identifies as a male who was assigned male at birth, and is being seen today for coughing that started 3 days back. Had URI signs runny nose, and congestion that started Friday 09/07/2022, as well. Nothing is improving and tried OTC medication treatment, and chicken noodle soup.  He is having coughing episodes that are taking his breath away.   Denies fevers, chills, chest pain, shortness of breath, headaches.  Was around a friend last Tues and he was sick.   Problems:  Patient Active Problem List   Diagnosis Date Noted   Carpal tunnel syndrome, left upper limb 07/12/2022   Tendinitis, de Quervain's 06/01/2021   Gastroesophageal reflux disease 07/01/2018   Pyrosis 07/01/2018   Atypical chest pain 07/01/2018   History of colonoscopy 07/01/2018   Hemorrhoids 07/01/2018   Family history of premature coronary artery disease 12/16/2013   Hypertension 11/26/2010   Cancer ([place]) 07/02/2010   Hyperlipidemia 11/26/2009    Allergies: No Known Allergies Medications:  Current Outpatient Medications:    amLODipine (NORVASC) 10 MG tablet, TAKE 1 TABLET BY MOUTH  DAILY, Disp: 90 tablet, Rfl: 3   atorvastatin (LIPITOR) 20 MG tablet, TAKE 1 TABLET BY MOUTH  DAILY, Disp: 90 tablet, Rfl:  3   Ferrous Sulfate (IRON) 28 MG TABS, Take by mouth., Disp: , Rfl:    losartan (COZAAR) 100 MG tablet, TAKE 1 TABLET BY MOUTH  DAILY, Disp: 90 tablet, Rfl: 1   Magnesium 500 MG TABS, Take by mouth., Disp: , Rfl:    metFORMIN (GLUCOPHAGE-XR) 500 MG 24 hr tablet, TAKE 2 TABLETS(1000 MG) BY MOUTH DAILY WITH BREAKFAST, Disp: 180 tablet, Rfl: 0   Multiple Vitamin (MULTIVITAMIN) tablet, Take 1 tablet by mouth  daily., Disp: , Rfl:    olopatadine (PATANOL) 0.1 % ophthalmic solution, Place 1 drop into both eyes 2 (two) times daily., Disp: 5 mL, Rfl: 12  Observations/Objective: Patient is well-developed, well-nourished in no acute distress.  Resting comfortably  at home.  Head is normocephalic atraumatic.  No labored breathing.  Speech is clear and coherent with logical content.  Patient is alert and oriented at baseline.  Cough Congestion tone   Assessment and Plan: 1. Acute viral sinusitis  - benzonatate (TESSALON) 100 MG capsule; Take 2 capsules (200 mg total) by mouth 2 (two) times daily as needed for cough.  Dispense: 30 capsule; Refill: 0 - fluticasone (FLONASE) 50 MCG/ACT nasal spray; Place 2 sprays into both nostrils daily.  Dispense: 16 g; Refill: 0 - albuterol (VENTOLIN HFA) 108 (90 Base) MCG/ACT inhaler; Inhale 1-2 puffs into the lungs every 6 (six) hours as needed for wheezing or shortness of breath.  Dispense: 8 g; Refill: 0  -Take meds as prescribed -Rest -Flonase Spray -Use a cool mist humidifier especially during the winter months when heat dries out the air. - Use saline nose sprays frequently to help soothe nasal passages and promote drainage. -Saline irrigations of the nose can be very helpful if done frequently.             * 4X daily for 1 week*             * Use of a nettie pot can be helpful with this.  *Follow directions with this* *Boiled or distilled water only -stay hydrated by drinking plenty of fluids - Keep thermostat turn down low to prevent drying out sinuses - For any cough or congestion- robitussin DM or Delsym as needed - For fever or aches or pains- take tylenol or ibuprofen as directed on bottle             * for fevers greater than 101 orally you may alternate ibuprofen and tylenol every 3 hours.  If you do not improve you will need a follow up visit in person.                Reviewed side effects, risks and benefits of medication.    Patient  acknowledged agreement and understanding of the plan.   Past Medical, Surgical, Social History, Allergies, and Medications have been Reviewed.      Follow Up Instructions: I discussed the assessment and treatment plan with the patient. The patient was provided an opportunity to ask questions and all were answered. The patient agreed with the plan and demonstrated an understanding of the instructions.  A copy of instructions were sent to the patient via MyChart unless otherwise noted below.    The patient was advised to call back or seek an in-person evaluation if the symptoms worsen or if the condition fails to improve as anticipated.  Time:  I spent 1 minutes with the patient via telehealth technology discussing the above problems/concerns.    Perlie Mayo, NP

## 2022-09-10 NOTE — Patient Instructions (Signed)
Nathaniel Smith, thank you for joining Perlie Mayo, NP for today's virtual visit.  While this provider is not your primary care provider (PCP), if your PCP is located in our provider database this encounter information will be shared with them immediately following your visit.   [facility] account gives you access to today's visit and all your visits, tests, and labs performed at [facility] " click here if you don't have a [facility] account or go to [email or web address]  Consent: (Patient) Nathaniel Smith provided verbal consent for this virtual visit at the beginning of the encounter.  Current Medications:  Current Outpatient Medications:    albuterol (VENTOLIN HFA) 108 (90 Base) MCG/ACT inhaler, Inhale 1-2 puffs into the lungs every 6 (six) hours as needed for wheezing or shortness of breath., Disp: 8 g, Rfl: 0   benzonatate (TESSALON) 100 MG capsule, Take 2 capsules (200 mg total) by mouth 2 (two) times daily as needed for cough., Disp: 30 capsule, Rfl: 0   fluticasone (FLONASE) 50 MCG/ACT nasal spray, Place 2 sprays into both nostrils daily., Disp: 16 g, Rfl: 0   amLODipine (NORVASC) 10 MG tablet, TAKE 1 TABLET BY MOUTH  DAILY, Disp: 90 tablet, Rfl: 3   atorvastatin (LIPITOR) 20 MG tablet, TAKE 1 TABLET BY MOUTH  DAILY, Disp: 90 tablet, Rfl: 3   Ferrous Sulfate (IRON) 28 MG TABS, Take by mouth., Disp: , Rfl:    losartan (COZAAR) 100 MG tablet, TAKE 1 TABLET BY MOUTH  DAILY, Disp: 90 tablet, Rfl: 1   Magnesium 500 MG TABS, Take by mouth., Disp: , Rfl:    metFORMIN (GLUCOPHAGE-XR) 500 MG 24 hr tablet, TAKE 2 TABLETS(1000 MG) BY MOUTH DAILY WITH BREAKFAST, Disp: 180 tablet, Rfl: 0   Multiple Vitamin (MULTIVITAMIN) tablet, Take 1 tablet by mouth daily., Disp: , Rfl:    olopatadine (PATANOL) 0.1 % ophthalmic solution, Place 1 drop into both eyes 2 (two) times daily., Disp: 5 mL, Rfl: 12   Medications ordered in this encounter:  Meds ordered this  encounter  Medications   benzonatate (TESSALON) 100 MG capsule    Sig: Take 2 capsules (200 mg total) by mouth 2 (two) times daily as needed for cough.    Dispense:  30 capsule    Refill:  0    Order Specific Question:   Supervising Provider    Answer:   Chase Picket [[id number]]   fluticasone (FLONASE) 50 MCG/ACT nasal spray    Sig: Place 2 sprays into both nostrils daily.    Dispense:  16 g    Refill:  0    Order Specific Question:   Supervising Provider    Answer:   Chase Picket [[id number]]   albuterol (VENTOLIN HFA) 108 (90 Base) MCG/ACT inhaler    Sig: Inhale 1-2 puffs into the lungs every 6 (six) hours as needed for wheezing or shortness of breath.    Dispense:  8 g    Refill:  0    Order Specific Question:   Supervising Provider    Answer:   Chase Picket [id number]     *If you need refills on other medications prior to your next appointment, please contact your pharmacy*  Follow-Up: Call back or seek an in-person evaluation if the symptoms worsen or if the condition fails to improve as anticipated.  [facility] [phone]  Other Instructions   -Take meds as prescribed -Rest -Flonase Spray -Use a cool mist humidifier  especially during the winter months when heat dries out the air. - Use saline nose sprays frequently to help soothe nasal passages and promote drainage. -Saline irrigations of the nose can be very helpful if done frequently.             * 4X daily for 1 week*             * Use of a nettie pot can be helpful with this.  *Follow directions with this* *Boiled or distilled water only -stay hydrated by drinking plenty of fluids - Keep thermostat turn down low to prevent drying out sinuses - For any cough or congestion- robitussin DM or Delsym as needed - For fever or aches or pains- take tylenol or ibuprofen as directed on bottle             * for fevers greater than 101 orally you may alternate ibuprofen and tylenol every 3  hours.  If you do not improve you will need a follow up visit in person.  If you have been instructed to have an in-person evaluation today at a local Urgent Care facility, please use the link below. It will take you to a list of all of our available [facility], including address, phone number and hours of operation. Please do not delay care.  [facility]  If you or a family member do not have a primary care provider, use the link below to schedule a visit and establish care. When you choose a [facility] primary care physician or advanced practice provider, you gain a long-term partner in health. Find a Primary Care Provider  Learn more about [facility]'s in-office and virtual care options: [facility]

## 2022-10-01 ENCOUNTER — Other Ambulatory Visit: Payer: Self-pay | Admitting: Orthopaedic Surgery

## 2022-10-01 DIAGNOSIS — G5602 Carpal tunnel syndrome, left upper limb: Secondary | ICD-10-CM | POA: Diagnosis not present

## 2022-10-01 MED ORDER — HYDROCODONE-ACETAMINOPHEN 5-325 MG PO TABS: 1.0000 | ORAL_TABLET | Freq: Four times a day (QID) | ORAL | 0 refills | Status: DC | PRN

## 2022-10-11 ENCOUNTER — Ambulatory Visit (INDEPENDENT_AMBULATORY_CARE_PROVIDER_SITE_OTHER): Payer: Medicare Other | Admitting: Orthopaedic Surgery

## 2022-10-11 ENCOUNTER — Encounter: Payer: Self-pay | Admitting: Orthopaedic Surgery

## 2022-10-11 VITALS — Ht 69.0 in | Wt 199.0 lb

## 2022-10-11 DIAGNOSIS — G5602 Carpal tunnel syndrome, left upper limb: Secondary | ICD-10-CM

## 2022-10-11 NOTE — Progress Notes (Signed)
   Post-Op Visit Note   Patient: Nathaniel Smith           Date of Birth: [birth year]           MRN: [id number] Visit Date: 10/11/2022 PCP: Susy Frizzle, MD   Assessment & Plan: Follow-up left carpal tunnel release he has little bit of swelling had little bit more pain.  But tightness in the flexors.  Will have him elevate his hand a little bit more to help with the swelling.  He can return next week for suture removal  Chief Complaint:  Chief Complaint  Patient presents with   Left Hand - Routine Post Op    10/01/2022 Left CTR   Visit Diagnoses:  1. Carpal tunnel syndrome, left upper limb     Plan: Nurse visit December 2016 after Christmas for suture removal.  He will keep his wrist splint on to protect his hand.  Follow-Up Instructions: No follow-ups on file.   Orders:  No orders of the defined types were placed in this encounter.  No orders of the defined types were placed in this encounter.   Imaging: No results found.  PMFS History: Patient Active Problem List   Diagnosis Date Noted   Carpal tunnel syndrome, left upper limb 07/12/2022   Tendinitis, de Quervain's 06/01/2021   Gastroesophageal reflux disease 07/01/2018   Pyrosis 07/01/2018   Atypical chest pain 07/01/2018   History of colonoscopy 07/01/2018   Hemorrhoids 07/01/2018   Family history of premature coronary artery disease 12/16/2013   Hypertension 11/26/2010   Cancer ([place]) 07/02/2010   Hyperlipidemia 11/26/2009   Past Medical History:  Diagnosis Date   Allergy    Cancer ([place]) 07/02/2010   prostate   Diabetes mellitus without complication ([place])    Hyperlipidemia 11/26/2009   Hypertension 11/26/2010   Pyrosis     Family History  Problem Relation Age of Onset   Diabetes Father    Heart disease Father    Heart disease Brother 60       Stent at 54--CAD   Colon cancer Maternal Uncle    Heart disease Paternal Aunt    Prostate cancer Paternal Uncle    Heart disease Paternal Uncle     Esophageal cancer Neg Hx    Inflammatory bowel disease Neg Hx    Liver disease Neg Hx    Pancreatic cancer Neg Hx    Rectal cancer Neg Hx    Stomach cancer Neg Hx    Colon polyps Neg Hx     Past Surgical History:  Procedure Laterality Date   CARPAL TUNNEL RELEASE Right 10/22/2006   [facility]   COLONOSCOPY     POLYPECTOMY     PROSTATE SURGERY  09/2010   seeds implant   UPPER GASTROINTESTINAL ENDOSCOPY     Social History   Occupational History   Not on file  Tobacco Use   Smoking status: Former   Smokeless tobacco: Never   Tobacco comments:    2001 quit  Vaping Use   Vaping Use: Never used  Substance and Sexual Activity   Alcohol use: No   Drug use: No   Sexual activity: Yes    Birth control/protection: Condom

## 2022-11-07 ENCOUNTER — Other Ambulatory Visit: Payer: Self-pay | Admitting: Family Medicine

## 2022-11-07 NOTE — Telephone Encounter (Signed)
Last OV 09/07/22. Future gen. Med exam 12/04/22.  Requested Prescriptions  Pending Prescriptions Disp Refills   losartan (COZAAR) 100 MG tablet [Pharmacy Med Name: Losartan Potassium 100 MG Oral Tablet] 100 tablet 1    Sig: TAKE 1 TABLET BY MOUTH DAILY     Cardiovascular:  Angiotensin Receptor Blockers Failed - 11/07/2022 10:41 AM      Failed - Valid encounter within last 6 months    Recent Outpatient Visits           8 months ago Controlled type 2 diabetes mellitus without complication, without long-term current use of insulin ([facility])   [place], Cammie Mcgee, MD   10 months ago Ingrown toenail of right foot   [place], Cammie Mcgee, MD   11 months ago General medical exam   [place] Susy Frizzle, MD   1 year ago Ingrown toenail of left foot   [place] Susy Frizzle, MD   1 year ago Right wrist pain   [place] Susy Frizzle, MD       Future Appointments             In 4 months [place], Cammie Mcgee, MD [place]            Passed - Cr in normal range and within 180 days    Creat  Date Value Ref Range Status  09/07/2022 1.06 0.70 - 1.35 mg/dL Final   Creatinine, Urine  Date Value Ref Range Status  09/07/2022 125 20 - 320 mg/dL Final         Passed - K in normal range and within 180 days    Potassium  Date Value Ref Range Status  09/07/2022 4.0 3.5 - 5.3 mmol/L Final         Passed - Patient is not pregnant      Passed - Last BP in normal range    BP Readings from Last 1 Encounters:  09/07/22 136/86

## 2022-11-20 ENCOUNTER — Other Ambulatory Visit: Payer: Self-pay | Admitting: Family Medicine

## 2022-11-20 DIAGNOSIS — R739 Hyperglycemia, unspecified: Secondary | ICD-10-CM

## 2022-12-04 ENCOUNTER — Ambulatory Visit (INDEPENDENT_AMBULATORY_CARE_PROVIDER_SITE_OTHER): Payer: Medicare Other | Admitting: Family Medicine

## 2022-12-04 ENCOUNTER — Encounter: Payer: Self-pay | Admitting: Family Medicine

## 2022-12-04 VITALS — BP 130/82 | HR 81 | Temp 98.0°F | Ht 69.0 in | Wt 199.0 lb

## 2022-12-04 DIAGNOSIS — Z8546 Personal history of malignant neoplasm of prostate: Secondary | ICD-10-CM

## 2022-12-04 DIAGNOSIS — I10 Essential (primary) hypertension: Secondary | ICD-10-CM

## 2022-12-04 DIAGNOSIS — Z0001 Encounter for general adult medical examination with abnormal findings: Secondary | ICD-10-CM | POA: Diagnosis not present

## 2022-12-04 DIAGNOSIS — E118 Type 2 diabetes mellitus with unspecified complications: Secondary | ICD-10-CM | POA: Diagnosis not present

## 2022-12-04 DIAGNOSIS — Z23 Encounter for immunization: Secondary | ICD-10-CM

## 2022-12-04 DIAGNOSIS — Z0289 Encounter for other administrative examinations: Secondary | ICD-10-CM | POA: Insufficient documentation

## 2022-12-04 DIAGNOSIS — Z0000 Encounter for general adult medical examination without abnormal findings: Secondary | ICD-10-CM

## 2022-12-04 NOTE — Progress Notes (Signed)
Subjective:    Patient ID: Nathaniel Smith, male    DOB: [birth year], 67 y.o.   MRN: [id number]  HPI Patient is a very pleasant 67 year old African-American gentleman who presents today for physical exam.  He is recommended to eat.  His last hemoglobin A1c was 6.2.  He does occasionally get some numbness and tingling in his left foot however his diabetic foot exam was performed today and was completely normal.  His blood pressure today is excellent at 130/82.  Reviewing his immunizations, he has had his shingles vaccine, his COVID-vaccine, his flu shot, and his tetanus shot.  He is due for Prevnar 20.  His last colonoscopy was in 2021 and due to tubular adenomas they recommended a repeat colonoscopy in 7 years.  Therefore he is due in 2028.  He has a history of prostate cancer we are due to monitor his PSA. Past Medical History:  Diagnosis Date   Allergy    Cancer ([place]) 07/02/2010   prostate   Diabetes mellitus without complication ([place])    Hyperlipidemia 11/26/2009   Hypertension 11/26/2010   Pyrosis    Past Surgical History:  Procedure Laterality Date   CARPAL TUNNEL RELEASE Right 10/22/2006   [facility]   COLONOSCOPY     POLYPECTOMY     PROSTATE SURGERY  09/2010   seeds implant   UPPER GASTROINTESTINAL ENDOSCOPY     Current Outpatient Medications on File Prior to Visit  Medication Sig Dispense Refill   amLODipine (NORVASC) 10 MG tablet TAKE 1 TABLET BY MOUTH  DAILY 90 tablet 3   atorvastatin (LIPITOR) 20 MG tablet TAKE 1 TABLET BY MOUTH  DAILY 90 tablet 3   benzonatate (TESSALON) 100 MG capsule Take 2 capsules (200 mg total) by mouth 2 (two) times daily as needed for cough. 30 capsule 0   fluticasone (FLONASE) 50 MCG/ACT nasal spray Place 2 sprays into both nostrils daily. 16 g 0   FLUZONE HIGH-DOSE QUADRIVALENT 0.7 ML SUSY      losartan (COZAAR) 100 MG tablet TAKE 1 TABLET BY MOUTH DAILY 100 tablet 1   Magnesium 500 MG TABS Take by mouth.     metFORMIN (GLUCOPHAGE-XR) 500  MG 24 hr tablet TAKE 2 TABLETS(1000 MG) BY MOUTH DAILY WITH BREAKFAST 180 tablet 0   Multiple Vitamin (MULTIVITAMIN) tablet Take 1 tablet by mouth daily.     olopatadine (PATANOL) 0.1 % ophthalmic solution Place 1 drop into both eyes 2 (two) times daily. 5 mL 12   SPIKEVAX syringe      No current facility-administered medications on file prior to visit.   No Known Allergies Social History   Socioeconomic History   Marital status: Married    Spouse name: Not on file   Number of children: 1   Years of education: Not on file   Highest education level: Not on file  Occupational History   Not on file  Tobacco Use   Smoking status: Former   Smokeless tobacco: Never   Tobacco comments:    2001 quit  Vaping Use   Vaping Use: Never used  Substance and Sexual Activity   Alcohol use: No   Drug use: No   Sexual activity: Yes    Birth control/protection: Condom  Other Topics Concern   Not on file  Social History Narrative   He drives dump trucks. Hauls rock, dirt, etc.   Divorced. Has one son and one stepdaughter.   :Walks on  [place] for 1 mile every morning Monday  through Friday.   He smoked 1/2 pack a day starting at age 63 and quit at age 75.--smoked for about 26 years.    Has Smoked none for 13 years.   Social Determinants of Health   Financial Resource Strain: Not on file  Food Insecurity: Not on file  Transportation Needs: Not on file  Physical Activity: Not on file  Stress: Not on file  Social Connections: Not on file  Intimate Partner Violence: Not on file   Family History  Problem Relation Age of Onset   Diabetes Father    Heart disease Father    Heart disease Brother 73       Stent at 54--CAD   Colon cancer Maternal Uncle    Heart disease Paternal Aunt    Prostate cancer Paternal Uncle    Heart disease Paternal Uncle    Esophageal cancer Neg Hx    Inflammatory bowel disease Neg Hx    Liver disease Neg Hx    Pancreatic cancer Neg Hx    Rectal cancer  Neg Hx    Stomach cancer Neg Hx    Colon polyps Neg Hx       Review of Systems  All other systems reviewed and are negative.      Objective:   Physical Exam Vitals reviewed.  Constitutional:      General: He is not in acute distress.    Appearance: He is well-developed. He is not diaphoretic.  HENT:     Head: Normocephalic and atraumatic.     Right Ear: External ear normal.     Left Ear: External ear normal.     Nose: Nose normal.     Mouth/Throat:     Pharynx: No oropharyngeal exudate.  Eyes:     General: No scleral icterus.       Right eye: No discharge.        Left eye: No discharge.     Conjunctiva/sclera: Conjunctivae normal.     Pupils: Pupils are equal, round, and reactive to light.  Neck:     Thyroid: No thyromegaly.     Vascular: No JVD.     Trachea: No tracheal deviation.  Cardiovascular:     Rate and Rhythm: Normal rate and regular rhythm.     Heart sounds: Normal heart sounds. No murmur heard.    No friction rub. No gallop.  Pulmonary:     Effort: Pulmonary effort is normal. No respiratory distress.     Breath sounds: Normal breath sounds. No stridor. No wheezing or rales.  Chest:     Chest wall: No tenderness.  Abdominal:     General: Bowel sounds are normal. There is no distension.     Palpations: Abdomen is soft. There is no mass.     Tenderness: There is no abdominal tenderness. There is no guarding or rebound.  Musculoskeletal:        General: No tenderness. Normal range of motion.     Cervical back: Normal range of motion and neck supple.  Lymphadenopathy:     Cervical: No cervical adenopathy.  Skin:    General: Skin is warm.     Coloration: Skin is not pale.     Findings: No erythema or rash.  Neurological:     Mental Status: He is alert and oriented to person, place, and time.     Cranial Nerves: No cranial nerve deficit.     Motor: No abnormal muscle tone.     Coordination: Coordination normal.  Deep Tendon Reflexes: Reflexes are  normal and symmetric.  Psychiatric:        Behavior: Behavior normal.        Thought Content: Thought content normal.        Judgment: Judgment normal.           Assessment & Plan:  Controlled type 2 diabetes mellitus with complication, without long-term current use of insulin (HCC) - Plan: Hemoglobin A1c, CBC with Differential/Platelet, COMPLETE METABOLIC PANEL WITH GFR, Lipid panel, Protein / Creatinine Ratio, Urine  General medical exam  History of prostate cancer - Plan: PSA  Benign essential HTN Patient's physical exam today is completely normal.  His blood pressure is outstanding.  His immunizations are up-to-date except for Prevnar 20.  He consents to receive Prevnar 20 today.  I will check a CBC a CMP a lipid panel and an A1c.  Goal LDL cholesterol is less than 100.  Goal A1c is less than 6.5.  Monitor his PSA for any evidence of prostate cancer recurrence.  The remainder of his medical exam is normal.  I recommended trying Voltaren gel coupled with a thumb spica splint for de Quervain's tendinitis on his right thumb.  The patient has been over, his right hand after he recently had carpal tunnel surgery on his left wrist and I believe this led to tendinitis in his right extensor tendon

## 2022-12-04 NOTE — Addendum Note (Signed)
Addended by: Randal Buba K on: 12/04/2022 08:59 AM   Modules accepted: Orders

## 2022-12-05 LAB — PSA: PSA: 0.06 ng/mL (ref <=4.00)

## 2022-12-05 LAB — COMPLETE METABOLIC PANEL WITH GFR
AG Ratio: 1.8 (calc) (ref 1.0–2.5)
ALT: 21 U/L (ref 9–46)
AST: 16 U/L (ref 10–35)
Albumin: 4.4 g/dL (ref 3.6–5.1)
Alkaline phosphatase (APISO): 49 U/L (ref 35–144)
BUN: 10 mg/dL (ref 7–25)
CO2: 22 mmol/L (ref 20–32)
Calcium: 9.7 mg/dL (ref 8.6–10.3)
Chloride: 105 mmol/L (ref 98–110)
Creat: 1.03 mg/dL (ref 0.70–1.35)
Globulin: 2.5 g/dL (calc) (ref 1.9–3.7)
Glucose, Bld: 94 mg/dL (ref 65–99)
Potassium: 4.3 mmol/L (ref 3.5–5.3)
Sodium: 140 mmol/L (ref 135–146)
Total Bilirubin: 0.3 mg/dL (ref 0.2–1.2)
Total Protein: 6.9 g/dL (ref 6.1–8.1)
eGFR: 80 mL/min/{1.73_m2} (ref >=60)

## 2022-12-05 LAB — CBC WITH DIFFERENTIAL/PLATELET
Absolute Monocytes: 330 cells/uL (ref 200–950)
Basophils Absolute: 39 cells/uL (ref 0–200)
Basophils Relative: 0.7 %
Eosinophils Absolute: 50 cells/uL (ref 15–500)
Eosinophils Relative: 0.9 %
HCT: 46.1 % (ref 38.5–50.0)
Hemoglobin: 15.5 g/dL (ref 13.2–17.1)
Lymphs Abs: 2044 cells/uL (ref 850–3900)
MCH: 27.7 pg (ref 27.0–33.0)
MCHC: 33.6 g/dL (ref 32.0–36.0)
MCV: 82.3 fL (ref 80.0–100.0)
MPV: 9.9 fL (ref 7.5–12.5)
Monocytes Relative: 5.9 %
Neutro Abs: 3136 cells/uL (ref 1500–7800)
Neutrophils Relative %: 56 %
Platelets: 240 10*3/uL (ref 140–400)
RBC: 5.6 10*6/uL (ref 4.20–5.80)
RDW: 13 % (ref 11.0–15.0)
Total Lymphocyte: 36.5 %
WBC: 5.6 10*3/uL (ref 3.8–10.8)

## 2022-12-05 LAB — HEMOGLOBIN A1C
Hgb A1c MFr Bld: 6.2 % of total Hgb — ABNORMAL HIGH (ref <5.7)
Mean Plasma Glucose: 131 mg/dL
eAG (mmol/L): 7.3 mmol/L

## 2022-12-05 LAB — PROTEIN / CREATININE RATIO, URINE
Creatinine, Urine: 151 mg/dL (ref 20–320)
Protein/Creat Ratio: 46 mg/g creat (ref 25–148)
Protein/Creatinine Ratio: 0.046 mg/mg creat (ref 0.025–0.148)
Total Protein, Urine: 7 mg/dL (ref 5–25)

## 2022-12-05 LAB — LIPID PANEL
Cholesterol: 157 mg/dL (ref <200)
HDL: 44 mg/dL (ref >=40)
LDL Cholesterol (Calc): 95 mg/dL (calc)
Non-HDL Cholesterol (Calc): 113 mg/dL (calc) (ref <130)
Total CHOL/HDL Ratio: 3.6 (calc) (ref <5.0)
Triglycerides: 88 mg/dL (ref <150)

## 2023-01-07 ENCOUNTER — Other Ambulatory Visit: Payer: Self-pay | Admitting: Family Medicine

## 2023-02-01 ENCOUNTER — Other Ambulatory Visit: Payer: Self-pay | Admitting: Family Medicine

## 2023-02-01 NOTE — Telephone Encounter (Signed)
Requested Prescriptions  Pending Prescriptions Disp Refills   atorvastatin (LIPITOR) 20 MG tablet [Pharmacy Med Name: Atorvastatin Calcium 20 MG Oral Tablet] 90 tablet 3    Sig: TAKE 1 TABLET BY MOUTH ONCE  DAILY     Cardiovascular:  Antilipid - Statins Failed - 02/01/2023  6:05 AM      Failed - Lipid Panel in normal range within the last 12 months    Cholesterol  Date Value Ref Range Status  12/04/2022 157 <200 mg/dL Final   LDL Cholesterol (Calc)  Date Value Ref Range Status  12/04/2022 95 mg/dL (calc) Final    Comment:    Reference range: <100 . Desirable range <100 mg/dL for primary prevention;   <70 mg/dL for patients with CHD or diabetic patients  with > or = 2 CHD risk factors. [name] LDL-C is now calculated using the Martin-Hopkins  calculation, which is a validated novel method providing  better accuracy than the Friedewald equation in the  estimation of LDL-C.  Horald Pollen et al. Lenox Ahr. 8889;169(45): 2061-2068  (http://education.[facility].com/faq/FAQ164)    HDL  Date Value Ref Range Status  12/04/2022 44 > OR = 40 mg/dL Final   Triglycerides  Date Value Ref Range Status  12/04/2022 88 <150 mg/dL Final         Passed - Patient is not pregnant      Passed - Valid encounter within last 12 months    Recent Outpatient Visits           11 months ago Controlled type 2 diabetes mellitus without complication, without long-term current use of insulin (HCC)   [facility] W[place], Priscille Heidelberg, MD   1 year ago Ingrown toenail of right foot   [facility] Kirsten, Priscille Heidelberg, MD   1 year ago General medical exam   [facility] Donita Brooks, MD   1 year ago Ingrown toenail of left foot   [facility] [place], Priscille Heidelberg, MD   1 year ago Right wrist pain   [facility] [place], Priscille Heidelberg, MD       Future Appointments             In 1 month Tinnon, Priscille Heidelberg, MD [facility]  Family [facility], [facility]

## 2023-02-11 DIAGNOSIS — M654 Radial styloid tenosynovitis [de Quervain]: Secondary | ICD-10-CM | POA: Insufficient documentation

## 2023-02-20 DIAGNOSIS — M654 Radial styloid tenosynovitis [de Quervain]: Secondary | ICD-10-CM | POA: Diagnosis not present

## 2023-02-20 DIAGNOSIS — Z4789 Encounter for other orthopedic aftercare: Secondary | ICD-10-CM | POA: Insufficient documentation

## 2023-02-27 ENCOUNTER — Other Ambulatory Visit: Payer: Self-pay | Admitting: Family Medicine

## 2023-02-27 DIAGNOSIS — R739 Hyperglycemia, unspecified: Secondary | ICD-10-CM

## 2023-03-06 DIAGNOSIS — M25531 Pain in right wrist: Secondary | ICD-10-CM | POA: Diagnosis not present

## 2023-03-08 ENCOUNTER — Other Ambulatory Visit: Payer: Medicare Other

## 2023-03-08 DIAGNOSIS — E118 Type 2 diabetes mellitus with unspecified complications: Secondary | ICD-10-CM

## 2023-03-09 LAB — HEMOGLOBIN A1C
Hgb A1c MFr Bld: 6.3 % of total Hgb — ABNORMAL HIGH (ref <5.7)
Mean Plasma Glucose: 134 mg/dL
eAG (mmol/L): 7.4 mmol/L

## 2023-03-10 DIAGNOSIS — M25531 Pain in right wrist: Secondary | ICD-10-CM | POA: Insufficient documentation

## 2023-03-20 DIAGNOSIS — M25531 Pain in right wrist: Secondary | ICD-10-CM | POA: Diagnosis not present

## 2023-04-04 ENCOUNTER — Ambulatory Visit: Payer: Medicare Other | Admitting: Family Medicine

## 2023-04-23 ENCOUNTER — Other Ambulatory Visit: Payer: Self-pay | Admitting: Family Medicine

## 2023-05-30 ENCOUNTER — Other Ambulatory Visit: Payer: Self-pay | Admitting: Family Medicine

## 2023-05-30 DIAGNOSIS — R739 Hyperglycemia, unspecified: Secondary | ICD-10-CM

## 2023-05-31 ENCOUNTER — Ambulatory Visit: Payer: Medicare Other | Admitting: Podiatry

## 2023-05-31 DIAGNOSIS — L603 Nail dystrophy: Secondary | ICD-10-CM

## 2023-05-31 MED ORDER — IBUPROFEN 800 MG PO TABS: 800.0000 mg | ORAL_TABLET | Freq: Four times a day (QID) | ORAL | 1 refills | Status: DC | PRN

## 2023-05-31 NOTE — Progress Notes (Signed)
Subjective:  Patient ID: Nathaniel Smith, male    DOB: 1956-03-05,  MRN: [id number]  Chief Complaint  Patient presents with   Ingrown Toenail    Left hallux     67 y.o. male presents with the above complaint.  Patient presents with left hallux nail dystrophy.  Patient states the nail has been present for the for quite some time.  He would like to have it removed and made permanent he is a diabetic with last A1c of 6.3.  He states that hurts with ambulation worse with pressure he has not seen MRIs prior to seeing me.  Pain scale is 5 out of 10 dull achy in nature he would like to have removed and made permanent   Review of Systems: Negative except as noted in the HPI. Denies N/V/F/Ch.  Past Medical History:  Diagnosis Date   Allergy    Cancer (HCC) 07/02/2010   prostate   Diabetes mellitus without complication (HCC)    Hyperlipidemia 11/26/2009   Hypertension 11/26/2010   Pyrosis     Current Outpatient Medications:    amLODipine (NORVASC) 10 MG tablet, TAKE 1 TABLET BY MOUTH DAILY, Disp: 90 tablet, Rfl: 3   atorvastatin (LIPITOR) 20 MG tablet, TAKE 1 TABLET BY MOUTH ONCE  DAILY, Disp: 90 tablet, Rfl: 3   empagliflozin (JARDIANCE) 25 MG TABS tablet, Take 1 tablet (25 mg total) by mouth daily before breakfast., Disp: 90 tablet, Rfl: 3   fluticasone (FLONASE) 50 MCG/ACT nasal spray, Place 2 sprays into both nostrils daily., Disp: 16 g, Rfl: 0   losartan (COZAAR) 100 MG tablet, TAKE 1 TABLET BY MOUTH DAILY, Disp: 100 tablet, Rfl: 2   Magnesium 500 MG TABS, Take by mouth., Disp: , Rfl:    metFORMIN (GLUCOPHAGE-XR) 500 MG 24 hr tablet, TAKE 2 TABLETS(1000 MG) BY MOUTH DAILY WITH BREAKFAST, Disp: 180 tablet, Rfl: 0   Multiple Vitamin (MULTIVITAMIN) tablet, Take 1 tablet by mouth daily., Disp: , Rfl:    olopatadine (PATANOL) 0.1 % ophthalmic solution, Place 1 drop into both eyes 2 (two) times daily., Disp: 5 mL, Rfl: 12   triamcinolone cream (KENALOG) 0.1 %, Apply 1 Application topically  2 (two) times daily., Disp: 30 g, Rfl: 0  Social History   Tobacco Use  Smoking Status Former  Smokeless Tobacco Never  Tobacco Comments   2001 quit    No Known Allergies Objective:  There were no vitals filed for this visit. There is no height or weight on file to calculate BMI. Constitutional Well developed. Well nourished.  Vascular Dorsalis pedis pulses palpable bilaterally. Posterior tibial pulses palpable bilaterally. Capillary refill normal to all digits.  No cyanosis or clubbing noted. Pedal hair growth normal.  Neurologic Normal speech. Oriented to person, place, and time. Epicritic sensation to light touch grossly present bilaterally.  Dermatologic Pain on palpation of the entire/total nail on 1st digit of the left No other open wounds. No skin lesions.  Orthopedic: Normal joint ROM without pain or crepitus bilaterally. No visible deformities. No bony tenderness.   Radiographs: None Assessment:   1. Nail dystrophy    Plan:  Patient was evaluated and treated and all questions answered.  Nail contusion/dystrophy hallux, left -Patient elects to proceed with minor surgery to remove entire toenail today. Consent reviewed and signed by patient. -Entire/total nail excised. See procedure note. -Educated on post-procedure care including soaking. Written instructions provided and reviewed. -Patient to follow up in 2 weeks for nail check.  Procedure: Excision of entire/total nail  with phenol matricectomy Location: Left 1st toe digit Anesthesia: Lidocaine 1% plain; 1.5 mL and Marcaine 0.5% plain; 1.5 mL, digital block. Skin Prep: Betadine. Dressing: Silvadene; telfa; dry, sterile, compression dressing. Technique: Following skin prep, the toe was exsanguinated and a tourniquet was secured at the base of the toe. The affected nail border was freed and excised.  Phenol matricectomy was performed in standard technique.  The tourniquet was then removed and sterile dressing  applied. Disposition: Patient tolerated procedure well. Patient to return in 2 weeks for follow-up.   No follow-ups on file.

## 2023-05-31 NOTE — Telephone Encounter (Signed)
Requested Prescriptions  Pending Prescriptions Disp Refills   metFORMIN (GLUCOPHAGE-XR) 500 MG 24 hr tablet [Pharmacy Med Name: METFORMIN ER 500MG  24HR TABS] 180 tablet 0    Sig: TAKE 2 TABLETS(1000 MG) BY MOUTH DAILY WITH BREAKFAST     Endocrinology:  Diabetes - Biguanides Failed - 05/30/2023  3:37 AM      Failed - B12 Level in normal range and within 720 days    No results found for: "VITAMINB12"       Failed - Valid encounter within last 6 months    Recent Outpatient Visits           1 year ago Controlled type 2 diabetes mellitus without complication, without long-term current use of insulin (HCC)   [facility] Castanon, Priscille Heidelberg, MD   1 year ago Ingrown toenail of right foot   [facility] Fern, Priscille Heidelberg, MD   1 year ago General medical exam   [facility] Donita Brooks, MD   1 year ago Ingrown toenail of left foot   [facility] Donita Brooks, MD   2 years ago Right wrist pain   [facility] Tanya Nones, Priscille Heidelberg, MD              Passed - Cr in normal range and within 360 days    Creat  Date Value Ref Range Status  12/04/2022 1.03 0.70 - 1.35 mg/dL Final   Creatinine, Urine  Date Value Ref Range Status  12/04/2022 151 20 - 320 mg/dL Final         Passed - HBA1C is between 0 and 7.9 and within 180 days    Hgb A1c MFr Bld  Date Value Ref Range Status  03/08/2023 6.3 (H) <5.7 % of total Hgb Final    Comment:    For someone without known diabetes, a hemoglobin  A1c value between 5.7% and 6.4% is consistent with prediabetes and should be confirmed with a  follow-up test. . For someone with known diabetes, a value <7% indicates that their diabetes is well controlled. A1c targets should be individualized based on duration of diabetes, age, comorbid conditions, and other considerations. . This assay result is consistent with an increased risk of diabetes. . Currently, no  consensus exists regarding use of hemoglobin A1c for diagnosis of diabetes for children. .          Passed - eGFR in normal range and within 360 days    GFR, Est African American  Date Value Ref Range Status  11/29/2020 78 > OR = 60 mL/min/1.51m2 Final   GFR, Est Non African American  Date Value Ref Range Status  11/29/2020 67 > OR = 60 mL/min/1.47m2 Final   eGFR  Date Value Ref Range Status  12/04/2022 80 > OR = 60 mL/min/1.56m2 Final         Passed - CBC within normal limits and completed in the last 12 months    WBC  Date Value Ref Range Status  12/04/2022 5.6 3.8 - 10.8 Thousand/uL Final   RBC  Date Value Ref Range Status  12/04/2022 5.60 4.20 - 5.80 Million/uL Final   Hemoglobin  Date Value Ref Range Status  12/04/2022 15.5 13.2 - 17.1 g/dL Final   HCT  Date Value Ref Range Status  12/04/2022 46.1 38.5 - 50.0 % Final   MCHC  Date Value Ref Range Status  12/04/2022 33.6 32.0 - 36.0 g/dL Final  [facility]  Date Value Ref Range Status  12/04/2022 27.7 27.0 - 33.0 pg Final   MCV  Date Value Ref Range Status  12/04/2022 82.3 80.0 - 100.0 fL Final   No results found for: "PLTCOUNTKUC", "LABPLAT", "POCPLA" RDW  Date Value Ref Range Status  12/04/2022 13.0 11.0 - 15.0 % Final

## 2023-06-07 ENCOUNTER — Encounter: Payer: Self-pay | Admitting: Family Medicine

## 2023-06-07 ENCOUNTER — Ambulatory Visit (INDEPENDENT_AMBULATORY_CARE_PROVIDER_SITE_OTHER): Payer: Medicare Other | Admitting: Family Medicine

## 2023-06-07 VITALS — BP 120/72 | HR 88 | Temp 98.2°F | Ht 69.0 in | Wt 206.0 lb

## 2023-06-07 DIAGNOSIS — E118 Type 2 diabetes mellitus with unspecified complications: Secondary | ICD-10-CM

## 2023-06-07 DIAGNOSIS — Z7984 Long term (current) use of oral hypoglycemic drugs: Secondary | ICD-10-CM

## 2023-06-07 DIAGNOSIS — I10 Essential (primary) hypertension: Secondary | ICD-10-CM | POA: Diagnosis not present

## 2023-06-07 MED ORDER — TRIAMCINOLONE ACETONIDE 0.1 % EX CREA: 1.0000 | TOPICAL_CREAM | Freq: Two times a day (BID) | CUTANEOUS | 0 refills | Status: DC

## 2023-06-07 MED ORDER — EMPAGLIFLOZIN 25 MG PO TABS: 25.0000 mg | ORAL_TABLET | Freq: Every day | ORAL | 11 refills | Status: DC

## 2023-06-07 MED ORDER — EMPAGLIFLOZIN 25 MG PO TABS: 25.0000 mg | ORAL_TABLET | Freq: Every day | ORAL | 3 refills | Status: DC

## 2023-06-07 NOTE — Progress Notes (Signed)
Subjective:    Patient ID: Nathaniel Smith, male    DOB: 04-26-1956, 67 y.o.   MRN: [id number]  HPI Patient is a very pleasant 67 year old African-American gentleman who presents today for follow up.  He has diabetes.  His A1c has been well-controlled at 6.3.  He is currently on metformin.  He is taking 1 metformin pill in the morning and 1 metformin pill in the evening.  However he is dealing with diarrhea.  The patient drives a truck for living.  The medication causes him problems because the diarrhea bothers him constantly and interferes with his ability to work at times.  Otherwise he is doing well on the metformin.  He denies any abdominal pain.  His blood pressure today is outstanding.  He denies any chest pain shortness of breath or dyspnea on exertion.  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  He has been using moisturizers with no success Past Medical History:  Diagnosis Date   Allergy    Cancer (HCC) 07/02/2010   prostate   Diabetes mellitus without complication (HCC)    Hyperlipidemia 11/26/2009   Hypertension 11/26/2010   Pyrosis    Past Surgical History:  Procedure Laterality Date   CARPAL TUNNEL RELEASE Right 10/22/2006   [facility]   COLONOSCOPY     POLYPECTOMY     PROSTATE SURGERY  09/2010   seeds implant   UPPER GASTROINTESTINAL ENDOSCOPY     Current Outpatient Medications on File Prior to Visit  Medication Sig Dispense Refill   amLODipine (NORVASC) 10 MG tablet TAKE 1 TABLET BY MOUTH DAILY 90 tablet 3   atorvastatin (LIPITOR) 20 MG tablet TAKE 1 TABLET BY MOUTH ONCE  DAILY 90 tablet 3   benzonatate (TESSALON) 100 MG capsule Take 2 capsules (200 mg total) by mouth 2 (two) times daily as needed for cough. 30 capsule 0   fluticasone (FLONASE) 50 MCG/ACT nasal spray Place 2 sprays into both nostrils daily. 16 g 0   ibuprofen (ADVIL) 800 MG tablet Take 1 tablet (800 mg total) by mouth every 6 (six) hours as needed. 60 tablet 1   losartan (COZAAR) 100 MG tablet TAKE  1 TABLET BY MOUTH DAILY 100 tablet 2   Magnesium 500 MG TABS Take by mouth.     metFORMIN (GLUCOPHAGE-XR) 500 MG 24 hr tablet TAKE 2 TABLETS(1000 MG) BY MOUTH DAILY WITH BREAKFAST 180 tablet 0   Multiple Vitamin (MULTIVITAMIN) tablet Take 1 tablet by mouth daily.     olopatadine (PATANOL) 0.1 % ophthalmic solution Place 1 drop into both eyes 2 (two) times daily. 5 mL 12   No current facility-administered medications on file prior to visit.   No Known Allergies Social History   Socioeconomic History   Marital status: Married    Spouse name: Not on file   Number of children: 1   Years of education: Not on file   Highest education level: Not on file  Occupational History   Not on file  Tobacco Use   Smoking status: Former   Smokeless tobacco: Never   Tobacco comments:    2001 quit  Vaping Use   Vaping status: Never Used  Substance and Sexual Activity   Alcohol use: No   Drug use: No   Sexual activity: Yes    Birth control/protection: Condom  Other Topics Concern   Not on file  Social History Narrative   He drives dump trucks. Hauls rock, dirt, etc.   Divorced. Has one son and one  stepdaughter.   [place]eredeth Ide on  Treadmill mill for 1 mile every morning Monday through Friday.   He smoked 1/2 pack a day starting at age 51 and quit at age 79.--smoked for about 26 years.    Has Smoked none for 13 years.   Social Determinants of Health   Financial Resource Strain: Not on file  Food Insecurity: Not on file  Transportation Needs: Not on file  Physical Activity: Not on file  Stress: Not on file  Social Connections: Not on file  Intimate Partner Violence: Not on file   Family History  Problem Relation Age of Onset   Diabetes Father    Heart disease Father    Heart disease Brother 5       Stent at 54--CAD   Colon cancer Maternal Uncle    Heart disease Paternal Aunt    Prostate cancer Paternal Uncle    Heart disease Paternal Uncle    Esophageal cancer Neg Hx    Inflammatory  bowel disease Neg Hx    Liver disease Neg Hx    Pancreatic cancer Neg Hx    Rectal cancer Neg Hx    Stomach cancer Neg Hx    Colon polyps Neg Hx       Review of Systems  All other systems reviewed and are negative.      Objective:   Physical Exam Vitals reviewed.  Constitutional:      General: He is not in acute distress.    Appearance: He is well-developed. He is not diaphoretic.  HENT:     Head: Normocephalic and atraumatic.     Right Ear: External ear normal.     Left Ear: External ear normal.     Nose: Nose normal.     Mouth/Throat:     Pharynx: No oropharyngeal exudate.  Eyes:     General: No scleral icterus.       Right eye: No discharge.        Left eye: No discharge.     Conjunctiva/sclera: Conjunctivae normal.     Pupils: Pupils are equal, round, and reactive to light.  Neck:     Thyroid: No thyromegaly.     Vascular: No JVD.     Trachea: No tracheal deviation.  Cardiovascular:     Rate and Rhythm: Normal rate and regular rhythm.     Heart sounds: Normal heart sounds. No murmur heard.    No friction rub. No gallop.  Pulmonary:     Effort: Pulmonary effort is normal. No respiratory distress.     Breath sounds: Normal breath sounds. No stridor. No wheezing or rales.  Chest:     Chest wall: No tenderness.  Abdominal:     General: Bowel sounds are normal. There is no distension.     Palpations: Abdomen is soft. There is no mass.     Tenderness: There is no abdominal tenderness. There is no guarding or rebound.  Musculoskeletal:        General: No tenderness. Normal range of motion.     Cervical back: Normal range of motion and neck supple.  Lymphadenopathy:     Cervical: No cervical adenopathy.  Skin:    General: Skin is warm.     Coloration: Skin is not pale.     Findings: No erythema or rash.  Neurological:     Mental Status: He is alert and oriented to person, place, and time.     Cranial Nerves: No cranial nerve deficit.  Motor: No abnormal  muscle tone.     Coordination: Coordination normal.     Deep Tendon Reflexes: Reflexes are normal and symmetric.  Psychiatric:        Behavior: Behavior normal.        Thought Content: Thought content normal.        Judgment: Judgment normal.           Assessment & Plan:  Controlled type 2 diabetes mellitus with complication, without long-term current use of insulin (HCC) - Plan: CBC with Differential/Platelet, COMPLETE METABOLIC PANEL WITH GFR, Lipid panel, Hemoglobin A1c, Protein / Creatinine Ratio, Urine  Benign essential HTN Blood pressure is outstanding.  Diabetes has been well-controlled but we will try switching the patient from metformin to Jardiance.  I sent the prescription to his pharmacy.  If the [place] is too expensive the patient decides to stay on metformin.  However he would like to at least see the price of Jardiance first especially given the cardiovascular benefits.  Check an A1c today.  Goal A1c is less than 6.5.  Check cholesterol panel.  Goal LDL cholesterol is less than 100

## 2023-06-08 LAB — CBC WITH DIFFERENTIAL/PLATELET
Absolute Monocytes: 412 {cells}/uL (ref 200–950)
Basophils Absolute: 29 {cells}/uL (ref 0–200)
Basophils Relative: 0.5 %
Eosinophils Absolute: 70 {cells}/uL (ref 15–500)
Eosinophils Relative: 1.2 %
HCT: 46.2 % (ref 38.5–50.0)
Hemoglobin: 15.1 g/dL (ref 13.2–17.1)
Lymphs Abs: 2007 {cells}/uL (ref 850–3900)
MCH: 27.4 pg (ref 27.0–33.0)
MCHC: 32.7 g/dL (ref 32.0–36.0)
MCV: 83.8 fL (ref 80.0–100.0)
MPV: 10.1 fL (ref 7.5–12.5)
Monocytes Relative: 7.1 %
Neutro Abs: 3283 {cells}/uL (ref 1500–7800)
Neutrophils Relative %: 56.6 %
Platelets: 239 10*3/uL (ref 140–400)
RBC: 5.51 10*6/uL (ref 4.20–5.80)
RDW: 13.4 % (ref 11.0–15.0)
Total Lymphocyte: 34.6 %
WBC: 5.8 10*3/uL (ref 3.8–10.8)

## 2023-06-08 LAB — COMPLETE METABOLIC PANEL WITH GFR
AG Ratio: 1.8 (calc) (ref 1.0–2.5)
ALT: 26 U/L (ref 9–46)
AST: 19 U/L (ref 10–35)
Albumin: 4.2 g/dL (ref 3.6–5.1)
Alkaline phosphatase (APISO): 45 U/L (ref 35–144)
BUN: 8 mg/dL (ref 7–25)
CO2: 22 mmol/L (ref 20–32)
Calcium: 9.5 mg/dL (ref 8.6–10.3)
Chloride: 105 mmol/L (ref 98–110)
Creat: 1.12 mg/dL (ref 0.70–1.35)
Globulin: 2.4 g/dL (ref 1.9–3.7)
Glucose, Bld: 104 mg/dL — ABNORMAL HIGH (ref 65–99)
Potassium: 4.4 mmol/L (ref 3.5–5.3)
Sodium: 139 mmol/L (ref 135–146)
Total Bilirubin: 0.4 mg/dL (ref 0.2–1.2)
Total Protein: 6.6 g/dL (ref 6.1–8.1)
eGFR: 72 mL/min/{1.73_m2} (ref >=60)

## 2023-06-08 LAB — LIPID PANEL
Cholesterol: 143 mg/dL (ref <200)
HDL: 38 mg/dL — ABNORMAL LOW (ref >=40)
LDL Cholesterol (Calc): 83 mg/dL
Non-HDL Cholesterol (Calc): 105 mg/dL (ref <130)
Total CHOL/HDL Ratio: 3.8 (calc) (ref <5.0)
Triglycerides: 120 mg/dL (ref <150)

## 2023-06-08 LAB — PROTEIN / CREATININE RATIO, URINE
Creatinine, Urine: 207 mg/dL (ref 20–320)
Protein/Creat Ratio: 43 mg/g{creat} (ref 25–148)
Protein/Creatinine Ratio: 0.043 mg/mg{creat} (ref 0.025–0.148)
Total Protein, Urine: 9 mg/dL (ref 5–25)

## 2023-06-08 LAB — HEMOGLOBIN A1C
Hgb A1c MFr Bld: 6.3 %{Hb} — ABNORMAL HIGH (ref <5.7)
Mean Plasma Glucose: 134 mg/dL
eAG (mmol/L): 7.4 mmol/L

## 2023-06-10 ENCOUNTER — Encounter: Payer: Self-pay | Admitting: Podiatry

## 2023-07-11 ENCOUNTER — Ambulatory Visit (INDEPENDENT_AMBULATORY_CARE_PROVIDER_SITE_OTHER): Payer: Medicare Other

## 2023-07-11 VITALS — Ht 69.0 in | Wt 206.0 lb

## 2023-07-11 DIAGNOSIS — Z0000 Encounter for general adult medical examination without abnormal findings: Secondary | ICD-10-CM | POA: Diagnosis not present

## 2023-07-11 NOTE — Patient Instructions (Signed)
Nathaniel Smith , Thank you for taking time to come for your Medicare Wellness Visit. I appreciate your ongoing commitment to your health goals. Please review the following plan we discussed and let me know if I can assist you in the future.   Referrals/Orders/Follow-Ups/Clinician Recommendations: Aim for 30 minutes of exercise or brisk walking, 6-8 glasses of water, and 5 servings of fruits and vegetables each day.  This is a list of the screening recommended for you and due dates:  Health Maintenance  Topic Date Due   COVID-19 Vaccine (7 - 2023-24 season) 06/23/2023   Flu Shot  01/20/2024*   Yearly kidney function blood test for diabetes  06/06/2024   Yearly kidney health urinalysis for diabetes  06/06/2024   Medicare Annual Wellness Visit  07/10/2024   DTaP/Tdap/Td vaccine (2 - Td or Tdap) 12/08/2024   Colon Cancer Screening  02/09/2027   Pneumonia Vaccine  Completed   Hepatitis C Screening  Completed   Zoster (Shingles) Vaccine  Completed   HPV Vaccine  Aged Out  *Topic was postponed. The date shown is not the original due date.    Advanced directives: (ACP Link)Information on Advanced Care Planning can be found at [facility] Advance Health Care Directives Advance Health Care Directives ([email or web address]   Next Medicare Annual Wellness Visit scheduled for next year: Yes

## 2023-07-11 NOTE — Progress Notes (Signed)
Subjective:   Nathaniel Smith is a 67 y.o. male who presents for Medicare Annual/Subsequent preventive examination.  Visit Complete: Virtual  I connected with  Nathaniel Smith on 07/11/23 by a audio enabled telemedicine application and verified that I am speaking with the correct person using two identifiers.  Patient Location: Home  Provider Location: Home Office  I discussed the limitations of evaluation and management by telemedicine. The patient expressed understanding and agreed to proceed.  Vital Signs: Because this visit was a virtual/telehealth visit, some criteria may be missing or patient reported. Any vitals not documented were not able to be obtained and vitals that have been documented are patient reported.   Cardiac Risk Factors include: advanced age (>38men, >53 women);hypertension;male gender;dyslipidemia     Objective:    Today's Vitals   07/11/23 0845  Weight: 206 lb (93.4 kg)  Height: 5\' 9"  (1.753 m)   Body mass index is 30.42 kg/m.     07/11/2023    8:51 AM 11/05/2018    8:16 AM  Advanced Directives  Does Patient Have a Medical Advance Directive? No Yes  Type of Advance Directive  Living will  Would patient like information on creating a medical advance directive? Yes (MAU/Ambulatory/Procedural Areas - Information given)     Current Medications (verified) Outpatient Encounter Medications as of 07/11/2023  Medication Sig   amLODipine (NORVASC) 10 MG tablet TAKE 1 TABLET BY MOUTH DAILY   atorvastatin (LIPITOR) 20 MG tablet TAKE 1 TABLET BY MOUTH ONCE  DAILY   empagliflozin (JARDIANCE) 25 MG TABS tablet Take 1 tablet (25 mg total) by mouth daily before breakfast.   fluticasone (FLONASE) 50 MCG/ACT nasal spray Place 2 sprays into both nostrils daily.   losartan (COZAAR) 100 MG tablet TAKE 1 TABLET BY MOUTH DAILY   Magnesium 500 MG TABS Take by mouth.   metFORMIN (GLUCOPHAGE-XR) 500 MG 24 hr tablet TAKE 2 TABLETS(1000 MG) BY MOUTH DAILY WITH BREAKFAST    Multiple Vitamin (MULTIVITAMIN) tablet Take 1 tablet by mouth daily.   olopatadine (PATANOL) 0.1 % ophthalmic solution Place 1 drop into both eyes 2 (two) times daily.   triamcinolone cream (KENALOG) 0.1 % Apply 1 Application topically 2 (two) times daily.   No facility-administered encounter medications on file as of 07/11/2023.    Allergies (verified) Patient has no known allergies.   History: Past Medical History:  Diagnosis Date   Allergy    Cancer (HCC) 07/02/2010   prostate   Diabetes mellitus without complication (HCC)    Hyperlipidemia 11/26/2009   Hypertension 11/26/2010   Pyrosis    Past Surgical History:  Procedure Laterality Date   CARPAL TUNNEL RELEASE Right 10/22/2006   [facility]   COLONOSCOPY     POLYPECTOMY     PROSTATE SURGERY  09/2010   seeds implant   UPPER GASTROINTESTINAL ENDOSCOPY     Family History  Problem Relation Age of Onset   Diabetes Father    Heart disease Father    Heart disease Brother 80       Stent at 54--CAD   Colon cancer Maternal Uncle    Heart disease Paternal Aunt    Prostate cancer Paternal Uncle    Heart disease Paternal Uncle    Esophageal cancer Neg Hx    Inflammatory bowel disease Neg Hx    Liver disease Neg Hx    Pancreatic cancer Neg Hx    Rectal cancer Neg Hx    Stomach cancer Neg Hx    Colon  polyps Neg Hx    Social History   Socioeconomic History   Marital status: Married    Spouse name: Not on file   Number of children: 1   Years of education: Not on file   Highest education level: Not on file  Occupational History   Not on file  Tobacco Use   Smoking status: Former   Smokeless tobacco: Never   Tobacco comments:    2001 quit  Vaping Use   Vaping status: Never Used  Substance and Sexual Activity   Alcohol use: No   Drug use: No   Sexual activity: Yes    Birth control/protection: Condom  Other Topics Concern   Not on file  Social History Narrative   He drives dump trucks. Hauls rock, dirt,  etc.   Divorced. Has one son and one stepdaughter.   :Walks on  Treadmill mill for 1 mile every morning Monday through Friday.   He smoked 1/2 pack a day starting at age 39 and quit at age 57.--smoked for about 26 years.    Has Smoked none for 13 years.   Social Determinants of Health   Financial Resource Strain: Low Risk  (07/11/2023)   Overall Financial Resource Strain (CARDIA)    Difficulty of Paying Living Expenses: Not hard at all  Food Insecurity: No Food Insecurity (07/11/2023)   Hunger Vital Sign    Worried About Running Out of Food in the Last Year: Never true    Ran Out of Food in the Last Year: Never true  Transportation Needs: No Transportation Needs (07/11/2023)   PRAPARE - Administrator, Civil Service (Medical): No    Lack of Transportation (Non-Medical): No  Physical Activity: Sufficiently Active (07/11/2023)   Exercise Vital Sign    Days of Exercise per Week: 5 days    Minutes of Exercise per Session: 40 min  Stress: No Stress Concern Present (07/11/2023)   [employer] of Occupational Health - Occupational Stress Questionnaire    Feeling of Stress : Not at all  Social Connections: Socially Integrated (07/11/2023)   Social Connection and Isolation Panel [NHANES]    Frequency of Communication with Friends and Family: More than three times a week    Frequency of Social Gatherings with Friends and Family: Three times a week    Attends Religious Services: More than 4 times per year    Active Member of Clubs or Organizations: Yes    Attends Banker Meetings: 1 to 4 times per year    Marital Status: Married    Tobacco Counseling Counseling given: Not Answered Tobacco comments: 2001 quit   Clinical Intake:  Pre-visit preparation completed: Yes  Pain : No/denies pain     Diabetes: No  How often do you need to have someone help you when you read instructions, pamphlets, or other written materials from your doctor or pharmacy?: 1 -  Never  Interpreter Needed?: No  Information entered by :: Kandis Fantasia LPN   Activities of Daily Living    07/11/2023    8:34 AM  In your present state of health, do you have any difficulty performing the following activities:  Hearing? 0  Vision? 0  Difficulty concentrating or making decisions? 0  Walking or climbing stairs? 0  Dressing or bathing? 0  Doing errands, shopping? 0  Preparing Food and eating ? N  Using the Toilet? N  In the past six months, have you accidently leaked urine? N  Do you have  problems with loss of bowel control? N  Managing your Medications? N  Managing your Finances? N  Housekeeping or managing your Housekeeping? N    Patient Care Team: Donita Brooks, MD as PCP - General (Family Medicine) West Bali, MD (Inactive) as Consulting Physician (Gastroenterology)  Indicate any recent Medical Services you may have received from other than [facility] providers in the past year (date may be approximate).     Assessment:   This is a routine wellness examination for Nahir.  Hearing/Vision screen Hearing Screening - Comments:: Denies hearing difficulties   Vision Screening - Comments::  up to date with routine eye exams with Dr. Fredrich Birks     Goals Addressed             This Visit's Progress    Remain active and independent        Depression Screen    07/11/2023    8:50 AM 06/07/2023    7:48 AM 12/21/2019    8:36 AM 11/27/2018    9:26 AM 11/04/2017    8:32 AM  PHQ 2/9 Scores  PHQ - 2 Score 0 0 0 0 0    Fall Risk    07/11/2023    8:52 AM 06/07/2023    7:48 AM  Fall Risk   Falls in the past year? 0 0  Number falls in past yr: 0 0  Injury with Fall? 0 0  Risk for fall due to : No Fall Risks No Fall Risks  Follow up Falls prevention discussed;Education provided;Falls evaluation completed Falls prevention discussed    MEDICARE RISK AT HOME: Medicare Risk at Home Any stairs in or around the home?: Yes If so, are there any without  handrails?: No Home free of loose throw rugs in walkways, pet beds, electrical cords, etc?: Yes Adequate lighting in your home to reduce risk of falls?: Yes Life alert?: No Use of a cane, walker or w/c?: No Grab bars in the bathroom?: Yes Shower chair or bench in shower?: No Elevated toilet seat or a handicapped toilet?: No  TIMED UP AND GO:  Was the test performed?  No    Cognitive Function:        07/11/2023    8:52 AM  6CIT Screen  What Year? 0 points  What month? 0 points  What time? 0 points  Count back from 20 0 points  Months in reverse 0 points  Repeat phrase 0 points  Total Score 0 points    Immunizations Immunization History  Administered Date(s) Administered   COVID-19, mRNA, vaccine(Comirnaty)12 years and older 07/26/2022   Influenza Split 09/21/2015   Influenza, High Dose Seasonal PF 07/26/2021   Influenza,inj,Quad PF,6+ Mos 11/01/2016, 10/03/2017, 09/05/2018, 07/03/2019, 08/26/2020   Influenza-Unspecified 09/21/2015, 08/14/2018, 07/26/2021, 07/26/2022   Moderna Sars-Covid-2 Vaccination 12/05/2019, 01/02/2020, 09/02/2020, 02/06/2021, 07/26/2021   PNEUMOCOCCAL CONJUGATE-20 12/04/2022   Tdap 12/08/2014   Zoster Recombinant(Shingrix) 12/28/2020, 06/05/2021    TDAP status: Up to date  Flu Vaccine status: Due, Education has been provided regarding the importance of this vaccine. Advised may receive this vaccine at local pharmacy or Health Dept. Aware to provide a copy of the vaccination record if obtained from local pharmacy or Health Dept. Verbalized acceptance and understanding.  Pneumococcal vaccine status: Up to date  Covid-19 vaccine status: Information provided on how to obtain vaccines.   Qualifies for Shingles Vaccine? Yes   Zostavax completed No   Shingrix Completed?: No.    Education has been provided regarding the importance  of this vaccine. Patient has been advised to call insurance company to determine out of pocket expense if they have not yet  received this vaccine. Advised may also receive vaccine at local pharmacy or Health Dept. Verbalized acceptance and understanding.  Screening Tests Health Maintenance  Topic Date Due   COVID-19 Vaccine (7 - 2023-24 season) 06/23/2023   INFLUENZA VACCINE  01/20/2024 (Originally 05/23/2023)   Diabetic kidney evaluation - eGFR measurement  06/06/2024   Diabetic kidney evaluation - Urine ACR  06/06/2024   Medicare Annual Wellness (AWV)  07/10/2024   DTaP/Tdap/Td (2 - Td or Tdap) 12/08/2024   Colonoscopy  02/09/2027   Pneumonia Vaccine 53+ Years old  Completed   Hepatitis C Screening  Completed   Zoster Vaccines- Shingrix  Completed   HPV VACCINES  Aged Out    Health Maintenance  Health Maintenance Due  Topic Date Due   COVID-19 Vaccine (7 - 2023-24 season) 06/23/2023    Colorectal cancer screening: Type of screening: Colonoscopy. Completed 02/09/20. Repeat every 7 years  Lung Cancer Screening: (Low Dose CT Chest recommended if Age 6-80 years, 20 pack-year currently smoking OR have quit w/in 15years.) does not qualify.   Lung Cancer Screening Referral: n/a  Additional Screening:  Hepatitis C Screening: does qualify; Completed 11/04/17  Vision Screening: Recommended annual ophthalmology exams for early detection of glaucoma and other disorders of the eye. Is the patient up to date with their annual eye exam?  Yes  Who is the provider or what is the name of the office in which the patient attends annual eye exams? Dr. Fredrich Birks  If pt is not established with a provider, would they like to be referred to a provider to establish care? No .   Dental Screening: Recommended annual dental exams for proper oral hygiene  Community Resource Referral / Chronic Care Management: CRR required this visit?  No   CCM required this visit?  No     Plan:     I have personally reviewed and noted the following in the patient's chart:   Medical and social history Use of alcohol, tobacco or  illicit drugs  Current medications and supplements including opioid prescriptions. Patient is not currently taking opioid prescriptions. Functional ability and status Nutritional status Physical activity Advanced directives List of other physicians Hospitalizations, surgeries, and ER visits in previous 12 months Vitals Screenings to include cognitive, depression, and falls Referrals and appointments  In addition, I have reviewed and discussed with patient certain preventive protocols, quality metrics, and best practice recommendations. A written personalized care plan for preventive services as well as general preventive health recommendations were provided to patient.     Kandis Fantasia [place], [place]   1/61/0960   After Visit Summary: (MyChart) Due to this being a telephonic visit, the after visit summary with patients personalized plan was offered to patient via MyChart   Nurse Notes: No concerns at this time

## 2023-08-26 ENCOUNTER — Other Ambulatory Visit: Payer: Self-pay | Admitting: Family Medicine

## 2023-08-26 DIAGNOSIS — R739 Hyperglycemia, unspecified: Secondary | ICD-10-CM

## 2023-09-09 ENCOUNTER — Ambulatory Visit (INDEPENDENT_AMBULATORY_CARE_PROVIDER_SITE_OTHER): Payer: Medicare Other | Admitting: Family Medicine

## 2023-09-09 VITALS — BP 124/82 | HR 70 | Temp 98.2°F | Ht 69.0 in | Wt 207.6 lb

## 2023-09-09 DIAGNOSIS — Z23 Encounter for immunization: Secondary | ICD-10-CM

## 2023-09-09 DIAGNOSIS — E118 Type 2 diabetes mellitus with unspecified complications: Secondary | ICD-10-CM

## 2023-09-09 DIAGNOSIS — H9190 Unspecified hearing loss, unspecified ear: Secondary | ICD-10-CM | POA: Insufficient documentation

## 2023-09-09 DIAGNOSIS — I10 Essential (primary) hypertension: Secondary | ICD-10-CM

## 2023-09-09 MED ORDER — SILDENAFIL CITRATE 100 MG PO TABS
50.0000 mg | ORAL_TABLET | Freq: Every day | ORAL | 11 refills | Status: DC | PRN
Start: 2023-09-09 — End: 2023-12-06

## 2023-09-09 MED ORDER — SILDENAFIL CITRATE 100 MG PO TABS: 50.0000 mg | ORAL_TABLET | Freq: Every day | ORAL | 11 refills | Status: DC | PRN

## 2023-09-09 NOTE — Progress Notes (Signed)
Subjective:    Patient ID: Nathaniel Smith, male    DOB: [birth year], 67 y.o.   MRN: [id number]  HPI Patient is a very pleasant 67 year old African-American gentleman who presents today for follow up.  He has diabetes.  Patient denies any concerns.  He is getting his diabetic eye exam after December.  He sees an ophthalmologist once a year.  Diabetic foot exam was performed today and was normal.  He denies any chest pain shortness of breath or dyspnea on exertion.  He is due for a flu shot today.  He denies any polyuria or dyspnea or blurry vision.  Pressure is excellent 124/82.  He has been taking metformin.  Instead he is taking Jardiance. Past Medical History:  Diagnosis Date   Allergy    Cancer (HCC) 07/02/2010   prostate   Diabetes mellitus without complication (HCC)    Hyperlipidemia 11/26/2009   Hypertension 11/26/2010   Pyrosis    Past Surgical History:  Procedure Laterality Date   CARPAL TUNNEL RELEASE Right 10/22/2006   [facility]   COLONOSCOPY     POLYPECTOMY     PROSTATE SURGERY  09/2010   seeds implant   UPPER GASTROINTESTINAL ENDOSCOPY     Current Outpatient Medications on File Prior to Visit  Medication Sig Dispense Refill   amLODipine (NORVASC) 10 MG tablet TAKE 1 TABLET BY MOUTH DAILY 90 tablet 3   atorvastatin (LIPITOR) 20 MG tablet TAKE 1 TABLET BY MOUTH ONCE  DAILY 90 tablet 3   empagliflozin (JARDIANCE) 25 MG TABS tablet Take 1 tablet (25 mg total) by mouth daily before breakfast. 90 tablet 3   fluticasone (FLONASE) 50 MCG/ACT nasal spray Place 2 sprays into both nostrils daily. 16 g 0   losartan (COZAAR) 100 MG tablet TAKE 1 TABLET BY MOUTH DAILY 100 tablet 2   Magnesium 500 MG TABS Take by mouth.     metFORMIN (GLUCOPHAGE-XR) 500 MG 24 hr tablet TAKE 2 TABLETS(1000 MG) BY MOUTH DAILY WITH BREAKFAST 180 tablet 0   Multiple Vitamin (MULTIVITAMIN) tablet Take 1 tablet by mouth daily.     olopatadine (PATANOL) 0.1 % ophthalmic solution Place 1 drop into  both eyes 2 (two) times daily. 5 mL 12   triamcinolone cream (KENALOG) 0.1 % Apply 1 Application topically 2 (two) times daily. 30 g 0   No current facility-administered medications on file prior to visit.   No Known Allergies Social History   Socioeconomic History   Marital status: Married    Spouse name: Not on file   Number of children: 1   Years of education: Not on file   Highest education level: Some college, no degree  Occupational History   Not on file  Tobacco Use   Smoking status: Former   Smokeless tobacco: Never   Tobacco comments:    2001 quit  Vaping Use   Vaping status: Never Used  Substance and Sexual Activity   Alcohol use: No   Drug use: No   Sexual activity: Yes    Birth control/protection: Condom  Other Topics Concern   Not on file  Social History Narrative   He drives dump trucks. Hauls rock, dirt, etc.   Divorced. Has one son and one stepdaughter.   :Walks on  [place]mill [place] for 1 mile every morning Monday through Friday.   He smoked 1/2 pack a day starting at age 52 and quit at age 67.--smoked for about 26 years.    Has Smoked none for 13 years.  Social Determinants of Health   Financial Resource Strain: Low Risk  (09/09/2023)   Overall Financial Resource Strain (CARDIA)    Difficulty of Paying Living Expenses: Not hard at all  Food Insecurity: No Food Insecurity (09/09/2023)   Hunger Vital Sign    Worried About Running Out of Food in the Last Year: Never true    Ran Out of Food in the Last Year: Never true  Transportation Needs: No Transportation Needs (09/09/2023)   PRAPARE - Administrator, Civil Service (Medical): No    Lack of Transportation (Non-Medical): No  Physical Activity: Insufficiently Active (09/09/2023)   Exercise Vital Sign    Days of Exercise per Week: 4 days    Minutes of Exercise per Session: 30 min  Stress: No Stress Concern Present (09/09/2023)   [employer] of Occupational Health - Occupational  Stress Questionnaire    Feeling of Stress : Not at all  Social Connections: Socially Integrated (09/09/2023)   Social Connection and Isolation Panel [NHANES]    Frequency of Communication with Friends and Family: More than three times a week    Frequency of Social Gatherings with Friends and Family: Three times a week    Attends Religious Services: More than 4 times per year    Active Member of Clubs or Organizations: Yes    Attends Banker Meetings: More than 4 times per year    Marital Status: Married  Catering manager Violence: Not At Risk (07/11/2023)   Humiliation, Afraid, Rape, and Kick questionnaire    Fear of Current or Ex-Partner: No    Emotionally Abused: No    Physically Abused: No    Sexually Abused: No   Family History  Problem Relation Age of Onset   Diabetes Father    Heart disease Father    Heart disease Brother 48       Stent at 54--CAD   Colon cancer Maternal Uncle    Heart disease Paternal Aunt    Prostate cancer Paternal Uncle    Heart disease Paternal Uncle    Esophageal cancer Neg Hx    Inflammatory bowel disease Neg Hx    Liver disease Neg Hx    Pancreatic cancer Neg Hx    Rectal cancer Neg Hx    Stomach cancer Neg Hx    Colon polyps Neg Hx       Review of Systems  All other systems reviewed and are negative.      Objective:   Physical Exam Vitals reviewed.  Constitutional:      General: He is not in acute distress.    Appearance: He is well-developed. He is not diaphoretic.  HENT:     Head: Normocephalic and atraumatic.     Right Ear: External ear normal.     Left Ear: External ear normal.     Nose: Nose normal.     Mouth/Throat:     Pharynx: No oropharyngeal exudate.  Eyes:     General: No scleral icterus.       Right eye: No discharge.        Left eye: No discharge.     Conjunctiva/sclera: Conjunctivae normal.     Pupils: Pupils are equal, round, and reactive to light.  Neck:     Thyroid: No thyromegaly.      Vascular: No JVD.     Trachea: No tracheal deviation.  Cardiovascular:     Rate and Rhythm: Normal rate and regular rhythm.     Heart  sounds: Normal heart sounds. No murmur heard.    No friction rub. No gallop.  Pulmonary:     Effort: Pulmonary effort is normal. No respiratory distress.     Breath sounds: Normal breath sounds. No stridor. No wheezing or rales.  Chest:     Chest wall: No tenderness.  Abdominal:     General: Bowel sounds are normal. There is no distension.     Palpations: Abdomen is soft. There is no mass.     Tenderness: There is no abdominal tenderness. There is no guarding or rebound.  Musculoskeletal:        General: No tenderness. Normal range of motion.     Cervical back: Normal range of motion and neck supple.  Lymphadenopathy:     Cervical: No cervical adenopathy.  Skin:    General: Skin is warm.     Coloration: Skin is not pale.     Findings: No erythema or rash.  Neurological:     Mental Status: He is alert and oriented to person, place, and time.     Cranial Nerves: No cranial nerve deficit.     Motor: No abnormal muscle tone.     Coordination: Coordination normal.     Deep Tendon Reflexes: Reflexes are normal and symmetric.  Psychiatric:        Behavior: Behavior normal.        Thought Content: Thought content normal.        Judgment: Judgment normal.           Assessment & Plan:  Controlled type 2 diabetes mellitus with complication, without long-term current use of insulin (HCC) - Plan: Microalbumin/Creatinine Ratio, Urine, Hemoglobin A1c, COMPLETE METABOLIC PANEL WITH GFR, CANCELED: Lipid panel  Benign essential HTN  Needs flu shot - Plan: Flu Vaccine Trivalent High Dose (Fluad) Blood pressure is excellent.  Patient received his flu shot.  Diabetic foot exam was normal.  Check hemoglobin A1c.  Goal hemoglobin A1c is less than 6.5.  Patient will continue Jardiance as long as it is not too expensive.  Check fasting lipid panel.  Goal LDL  cholesterol is less than 100.  Diabetic eye exam is pending.

## 2023-09-10 LAB — COMPLETE METABOLIC PANEL WITH GFR
AG Ratio: 1.8 (calc) (ref 1.0–2.5)
ALT: 30 U/L (ref 9–46)
AST: 21 U/L (ref 10–35)
Albumin: 4.4 g/dL (ref 3.6–5.1)
Alkaline phosphatase (APISO): 52 U/L (ref 35–144)
BUN: 10 mg/dL (ref 7–25)
CO2: 23 mmol/L (ref 20–32)
Calcium: 9.6 mg/dL (ref 8.6–10.3)
Chloride: 104 mmol/L (ref 98–110)
Creat: 1.28 mg/dL (ref 0.70–1.35)
Globulin: 2.5 g/dL (ref 1.9–3.7)
Glucose, Bld: 143 mg/dL — ABNORMAL HIGH (ref 65–99)
Potassium: 4 mmol/L (ref 3.5–5.3)
Sodium: 138 mmol/L (ref 135–146)
Total Bilirubin: 0.4 mg/dL (ref 0.2–1.2)
Total Protein: 6.9 g/dL (ref 6.1–8.1)
eGFR: 61 mL/min/{1.73_m2} (ref >=60)

## 2023-09-10 LAB — HEMOGLOBIN A1C
Hgb A1c MFr Bld: 6.8 %{Hb} — ABNORMAL HIGH (ref <5.7)
Mean Plasma Glucose: 148 mg/dL
eAG (mmol/L): 8.2 mmol/L

## 2023-09-10 LAB — MICROALBUMIN / CREATININE URINE RATIO
Creatinine, Urine: 70 mg/dL (ref 20–320)
Microalb Creat Ratio: 3 mg/g{creat} (ref <30)
Microalb, Ur: 0.2 mg/dL

## 2023-09-27 DIAGNOSIS — H5213 Myopia, bilateral: Secondary | ICD-10-CM | POA: Diagnosis not present

## 2023-09-27 DIAGNOSIS — H2513 Age-related nuclear cataract, bilateral: Secondary | ICD-10-CM | POA: Diagnosis not present

## 2023-09-27 LAB — HM DIABETES EYE EXAM

## 2023-10-03 ENCOUNTER — Ambulatory Visit: Payer: Self-pay | Admitting: Family Medicine

## 2023-10-03 NOTE — Telephone Encounter (Signed)
Called spoke w/pt, told that he would need to come in to be seen before he can treated. I don't have anything available for tom, soonest is Mon, 10/07/23, or he can check w/UC if he need something right away.   Per pt stated that he will go  to UC. Nothing further.

## 2023-10-03 NOTE — Telephone Encounter (Signed)
Copied from CRM [id number]. Topic: Clinical - Red Word Triage >> Oct 03, 2023  3:31 PM Prudencio Pair wrote: Red Word that prompted transfer to Nurse Triage: Patient calling in stating that he is having headaches, nose running, eyes running, frequent coughing.   Chief Complaint: Cold/allergy symptoms Symptoms: Nasal congestion, runny nose, cough with coughing fits, intermittent headache, clear eye drainage and redness, postnasal drip, previous sore throat Frequency: Constant nasal drainage, Intermittent other symptoms Pertinent Negatives: Patient denies SOB, fever, current sore throat, productive cough, colored drainage from eyes or nose Disposition: [] ED /[] Urgent Care (no appt availability in office) / [] Appointment(In office/virtual)/ [x]  [facility]/ [x] Home Care/ [] Refused Recommended Disposition /[]  Mobile Bus/ []  Follow-up with PCP Additional Notes: Pt reporting that he is experiencing nasal congestion and runny nose, cough with coughing fits, intermittent headache, clear eye drainage and redness, postnasal drip, and previous sore throat. Pt reporting that "everything started with sore throat on Monday afternoon and got worser and worser and worser, can't take it no more, over the counter meds - nothing working." Pt reporting that "yesterday and last night were the worst for cough, today is nothing like it was yesterday" Pt reporting that he is not coughing anything up, pt denies SOB, fever. Pt reporting "runny nose, headache only discomfort from headache when cough, frequent coughing," having coughing fits. Pt reporting that this feels like a "sinus infection, had one a couple years back." Advised exam before prescription of meds to ensure proper med. Pt reporting that he is out of town but is on his way home, "not able to do an appt until Monday" with work, pt is requesting a z-pak from his PCP. Offered virtual visit tonight, pt refusing. Pt hung up phone before home care advice  could be given. Please advise.  Reason for Disposition  Cough with cold symptoms (e.g., runny nose, postnasal drip, throat clearing)  Answer Assessment - Initial Assessment Questions 1. ONSET: "When did the cough begin?"      Pt reporting everything started with sore throat Monday afternoon and got worser and worser and worser, can't take it no more, ain't nothing working OTC 2. SEVERITY: "How bad is the cough today?"      Yesterday and last night were the worst for cough, today is nothing like it was yesterday 3. SPUTUM: "Describe the color of your sputum" (none, dry cough; clear, white, yellow, green)     No sputum 4. HEMOPTYSIS: "Are you coughing up any blood?" If so ask: "How much?" (flecks, streaks, tablespoons, etc.)     No 5. DIFFICULTY BREATHING: "Are you having difficulty breathing?" If Yes, ask: "How bad is it?" (e.g., mild, moderate, severe)    - MILD: No SOB at rest, mild SOB with walking, speaks normally in sentences, can lie down, no retractions, pulse < 100.    - MODERATE: SOB at rest, SOB with minimal exertion and prefers to sit, cannot lie down flat, speaks in phrases, mild retractions, audible wheezing, pulse 100-120.    - SEVERE: Very SOB at rest, speaks in single words, struggling to breathe, sitting hunched forward, retractions, pulse > 120      No SOB 6. FEVER: "Do you have a fever?" If Yes, ask: "What is your temperature, how was it measured, and when did it start?"     No 7. CARDIAC HISTORY: "Do you have any history of heart disease?" (e.g., heart attack, congestive heart failure)      No 8. LUNG HISTORY: "Do you  have any history of lung disease?"  (e.g., pulmonary embolus, asthma, emphysema)     No 9. PE RISK FACTORS: "Do you have a history of blood clots?" (or: recent major surgery, recent prolonged travel, bedridden)     No 10. OTHER SYMPTOMS: "Do you have any other symptoms?" (e.g., runny nose, wheezing, chest pain)       Runny nose, headache intermittently  only discomfort from headache when cough, frequent coughing, having coughing fits. Feels like a sinus infection to pt, had one a couple years back, requesting a z-pak 12. TRAVEL: "Have you traveled out of the country in the last month?" (e.g., travel history, exposures)       No  Protocols used: Cough - Acute Non-Productive-A-AH

## 2023-10-22 ENCOUNTER — Other Ambulatory Visit: Payer: Self-pay | Admitting: Family Medicine

## 2023-12-05 ENCOUNTER — Encounter: Payer: Medicare Other | Admitting: Family Medicine

## 2023-12-06 ENCOUNTER — Encounter: Payer: Self-pay | Admitting: Family Medicine

## 2023-12-06 ENCOUNTER — Ambulatory Visit (INDEPENDENT_AMBULATORY_CARE_PROVIDER_SITE_OTHER): Payer: Medicare Other | Admitting: Family Medicine

## 2023-12-06 VITALS — BP 142/86 | HR 67 | Temp 97.7°F | Ht 69.0 in | Wt 196.8 lb

## 2023-12-06 DIAGNOSIS — E118 Type 2 diabetes mellitus with unspecified complications: Secondary | ICD-10-CM | POA: Diagnosis not present

## 2023-12-06 DIAGNOSIS — Z8546 Personal history of malignant neoplasm of prostate: Secondary | ICD-10-CM

## 2023-12-06 DIAGNOSIS — Z7984 Long term (current) use of oral hypoglycemic drugs: Secondary | ICD-10-CM | POA: Diagnosis not present

## 2023-12-06 DIAGNOSIS — I10 Essential (primary) hypertension: Secondary | ICD-10-CM | POA: Diagnosis not present

## 2023-12-06 DIAGNOSIS — Z0001 Encounter for general adult medical examination with abnormal findings: Secondary | ICD-10-CM

## 2023-12-06 DIAGNOSIS — Z0000 Encounter for general adult medical examination without abnormal findings: Secondary | ICD-10-CM | POA: Diagnosis not present

## 2023-12-06 NOTE — Progress Notes (Signed)
Subjective:    Patient ID: Nathaniel Smith, male    DOB: [birth year], 68 y.o.   MRN: [id number]  HPI Patient is a very pleasant 68 year old African-American gentleman who presents today for physical exam.   His last colonoscopy was in 2021 and due to tubular adenomas they recommended a repeat colonoscopy in 7 years.  Therefore he is due in 2028.  He is due to repeat his PSA given his history of treated prostate cancer.  Diabetic eye exam showed no retinopathy in 12/24. His immunizations are up to date: Immunization History  Administered Date(s) Administered   Fluad Trivalent(High Dose 65+) 09/09/2023   Influenza Split 09/21/2015   Influenza, High Dose Seasonal PF 07/26/2021   Influenza,inj,Quad PF,6+ Mos 11/01/2016, 10/03/2017, 09/05/2018, 07/03/2019, 08/26/2020   Influenza-Unspecified 09/21/2015, 08/14/2018, 07/26/2021, 07/26/2022   Moderna Sars-Covid-2 Vaccination 12/05/2019, 01/02/2020, 09/02/2020, 02/06/2021, 07/26/2021   PNEUMOCOCCAL CONJUGATE-20 12/04/2022   Pfizer(Comirnaty)Fall Seasonal Vaccine 12 years and older 07/26/2022   Tdap 12/08/2014   Zoster Recombinant(Shingrix) 12/28/2020, 06/05/2021    Past Medical History:  Diagnosis Date   Allergy    Cancer (HCC) 07/02/2010   prostate   Diabetes mellitus without complication (HCC)    Hyperlipidemia 11/26/2009   Hypertension 11/26/2010   Pyrosis    Past Surgical History:  Procedure Laterality Date   CARPAL TUNNEL RELEASE Right 10/22/2006   [facility]   COLONOSCOPY     POLYPECTOMY     PROSTATE SURGERY  09/2010   seeds implant   UPPER GASTROINTESTINAL ENDOSCOPY     Current Outpatient Medications on File Prior to Visit  Medication Sig Dispense Refill   amLODipine (NORVASC) 10 MG tablet TAKE 1 TABLET BY MOUTH DAILY 100 tablet 2   atorvastatin (LIPITOR) 20 MG tablet TAKE 1 TABLET BY MOUTH ONCE  DAILY 100 tablet 2   empagliflozin (JARDIANCE) 25 MG TABS tablet Take 1 tablet (25 mg total) by mouth daily before breakfast.  90 tablet 3   fluticasone (FLONASE) 50 MCG/ACT nasal spray Place 2 sprays into both nostrils daily. 16 g 0   losartan (COZAAR) 100 MG tablet TAKE 1 TABLET BY MOUTH DAILY 100 tablet 2   Magnesium 500 MG TABS Take by mouth.     Multiple Vitamin (MULTIVITAMIN) tablet Take 1 tablet by mouth daily.     olopatadine (PATANOL) 0.1 % ophthalmic solution Place 1 drop into both eyes 2 (two) times daily. 5 mL 12   sildenafil (VIAGRA) 100 MG tablet Take 0.5-1 tablets (50-100 mg total) by mouth daily as needed for erectile dysfunction. 5 tablet 11   sildenafil (VIAGRA) 100 MG tablet Take 0.5-1 tablets (50-100 mg total) by mouth daily as needed for erectile dysfunction. 5 tablet 11   triamcinolone cream (KENALOG) 0.1 % Apply 1 Application topically 2 (two) times daily. 30 g 0   No current facility-administered medications on file prior to visit.   No Known Allergies Social History   Socioeconomic History   Marital status: Married    Spouse name: Not on file   Number of children: 1   Years of education: Not on file   Highest education level: Some college, no degree  Occupational History   Not on file  Tobacco Use   Smoking status: Former   Smokeless tobacco: Never   Tobacco comments:    2001 quit  Vaping Use   Vaping status: Never Used  Substance and Sexual Activity   Alcohol use: No   Drug use: No   Sexual activity: Yes  Birth control/protection: Condom  Other Topics Concern   Not on file  Social History Narrative   He drives dump trucks. Hauls rock, dirt, etc.   Divorced. Has one son and one stepdaughter.   :Walks on  [place]mill [place] for 1 mile every morning Monday through Friday.   He smoked 1/2 pack a day starting at age 68 and quit at age 71.--smoked for about 26 years.    Has Smoked none for 13 years.   Social Drivers of Corporate investment banker Strain: Low Risk  (12/04/2023)   Overall Financial Resource Strain (CARDIA)    Difficulty of Paying Living Expenses: Not hard at all   Food Insecurity: No Food Insecurity (12/04/2023)   Hunger Vital Sign    Worried About Running Out of Food in the Last Year: Never true    Ran Out of Food in the Last Year: Never true  Transportation Needs: No Transportation Needs (12/04/2023)   PRAPARE - Administrator, Civil Service (Medical): No    Lack of Transportation (Non-Medical): No  Physical Activity: Sufficiently Active (12/04/2023)   Exercise Vital Sign    Days of Exercise per Week: 4 days    Minutes of Exercise per Session: 50 min  Recent Concern: Physical Activity - Insufficiently Active (09/09/2023)   Exercise Vital Sign    Days of Exercise per Week: 4 days    Minutes of Exercise per Session: 30 min  Stress: No Stress Concern Present (12/04/2023)   [employer] of Occupational Health - Occupational Stress Questionnaire    Feeling of Stress : Not at all  Social Connections: Socially Integrated (12/04/2023)   Social Connection and Isolation Panel [NHANES]    Frequency of Communication with Friends and Family: More than three times a week    Frequency of Social Gatherings with Friends and Family: More than three times a week    Attends Religious Services: More than 4 times per year    Active Member of [employer] or Organizations: Yes    Attends Engineer, structural: More than 4 times per year    Marital Status: Married  Catering manager Violence: Not At Risk (07/11/2023)   Humiliation, Afraid, Rape, and Kick questionnaire    Fear of Current or Ex-Partner: No    Emotionally Abused: No    Physically Abused: No    Sexually Abused: No   Family History  Problem Relation Age of Onset   Diabetes Father    Heart disease Father    Heart disease Brother 20       Stent at 54--CAD   Colon cancer Maternal Uncle    Heart disease Paternal Aunt    Prostate cancer Paternal Uncle    Heart disease Paternal Uncle    Esophageal cancer Neg Hx    Inflammatory bowel disease Neg Hx    Liver disease Neg Hx     Pancreatic cancer Neg Hx    Rectal cancer Neg Hx    Stomach cancer Neg Hx    Colon polyps Neg Hx       Review of Systems  All other systems reviewed and are negative.      Objective:   Physical Exam Vitals reviewed.  Constitutional:      General: He is not in acute distress.    Appearance: He is well-developed. He is not diaphoretic.  HENT:     Head: Normocephalic and atraumatic.     Right Ear: External ear normal.     Left  Ear: External ear normal.     Nose: Nose normal.     Mouth/Throat:     Pharynx: No oropharyngeal exudate.  Eyes:     General: No scleral icterus.       Right eye: No discharge.        Left eye: No discharge.     Conjunctiva/sclera: Conjunctivae normal.     Pupils: Pupils are equal, round, and reactive to light.  Neck:     Thyroid: No thyromegaly.     Vascular: No JVD.     Trachea: No tracheal deviation.  Cardiovascular:     Rate and Rhythm: Normal rate and regular rhythm.     Heart sounds: Normal heart sounds. No murmur heard.    No friction rub. No gallop.  Pulmonary:     Effort: Pulmonary effort is normal. No respiratory distress.     Breath sounds: Normal breath sounds. No stridor. No wheezing or rales.  Chest:     Chest wall: No tenderness.  Abdominal:     General: Bowel sounds are normal. There is no distension.     Palpations: Abdomen is soft. There is no mass.     Tenderness: There is no abdominal tenderness. There is no guarding or rebound.  Musculoskeletal:        General: No tenderness. Normal range of motion.     Cervical back: Normal range of motion and neck supple.  Lymphadenopathy:     Cervical: No cervical adenopathy.  Skin:    General: Skin is warm.     Coloration: Skin is not pale.     Findings: No erythema or rash.  Neurological:     Mental Status: He is alert and oriented to person, place, and time.     Cranial Nerves: No cranial nerve deficit.     Motor: No abnormal muscle tone.     Coordination: Coordination  normal.     Deep Tendon Reflexes: Reflexes are normal and symmetric.  Psychiatric:        Behavior: Behavior normal.        Thought Content: Thought content normal.        Judgment: Judgment normal.           Assessment & Plan:  General medical exam - Plan: CBC with Differential/Platelet, COMPLETE METABOLIC PANEL WITH GFR, Lipid panel, PSA, Hemoglobin A1c, Microalbumin/Creatinine Ratio, Urine  Controlled type 2 diabetes mellitus with complication, without long-term current use of insulin (HCC) - Plan: CBC with Differential/Platelet, COMPLETE METABOLIC PANEL WITH GFR, Lipid panel, Hemoglobin A1c, Microalbumin/Creatinine Ratio, Urine  Benign essential HTN - Plan: CBC with Differential/Platelet, COMPLETE METABOLIC PANEL WITH GFR, Lipid panel, Hemoglobin A1c, Microalbumin/Creatinine Ratio, Urine  History of prostate cancer - Plan: PSA Patient's blood pressure today is slightly high.  I asked him to check his blood pressure on a daily basis to get an average of what his blood pressure is running.  I will check a CBC a CMP and a lipid panel along with an A1c.  Goal A1c is less than 6.5.  Goal LDL cholesterol is less than 100.  Check a urine protein creatinine ratio.  Patient's immunizations are up-to-date.  Diabetic eye exam and foot exam are up-to-date.  Colonoscopy is up-to-date.  I will check a PSA

## 2023-12-07 LAB — COMPLETE METABOLIC PANEL WITH GFR
AG Ratio: 1.7 (calc) (ref 1.0–2.5)
ALT: 22 U/L (ref 9–46)
AST: 21 U/L (ref 10–35)
Albumin: 4.5 g/dL (ref 3.6–5.1)
Alkaline phosphatase (APISO): 58 U/L (ref 35–144)
BUN: 10 mg/dL (ref 7–25)
CO2: 16 mmol/L — ABNORMAL LOW (ref 20–32)
Calcium: 10 mg/dL (ref 8.6–10.3)
Chloride: 111 mmol/L — ABNORMAL HIGH (ref 98–110)
Creat: 1.24 mg/dL (ref 0.70–1.35)
Globulin: 2.6 g/dL (ref 1.9–3.7)
Glucose, Bld: 91 mg/dL (ref 65–99)
Potassium: 4.7 mmol/L (ref 3.5–5.3)
Sodium: 146 mmol/L (ref 135–146)
Total Bilirubin: 0.2 mg/dL (ref 0.2–1.2)
Total Protein: 7.1 g/dL (ref 6.1–8.1)
eGFR: 64 mL/min/{1.73_m2} (ref >=60)

## 2023-12-07 LAB — CBC WITH DIFFERENTIAL/PLATELET
Absolute Lymphocytes: 2117 {cells}/uL (ref 850–3900)
Absolute Monocytes: 386 {cells}/uL (ref 200–950)
Basophils Absolute: 39 {cells}/uL (ref 0–200)
Basophils Relative: 0.7 %
Eosinophils Absolute: 218 {cells}/uL (ref 15–500)
Eosinophils Relative: 3.9 %
HCT: 48.2 % (ref 38.5–50.0)
Hemoglobin: 15.9 g/dL (ref 13.2–17.1)
MCH: 27.5 pg (ref 27.0–33.0)
MCHC: 33 g/dL (ref 32.0–36.0)
MCV: 83.4 fL (ref 80.0–100.0)
MPV: 10.4 fL (ref 7.5–12.5)
Monocytes Relative: 6.9 %
Neutro Abs: 2839 {cells}/uL (ref 1500–7800)
Neutrophils Relative %: 50.7 %
Platelets: 255 10*3/uL (ref 140–400)
RBC: 5.78 10*6/uL (ref 4.20–5.80)
RDW: 13 % (ref 11.0–15.0)
Total Lymphocyte: 37.8 %
WBC: 5.6 10*3/uL (ref 3.8–10.8)

## 2023-12-07 LAB — LIPID PANEL
Cholesterol: 142 mg/dL (ref <200)
HDL: 40 mg/dL (ref >=40)
LDL Cholesterol (Calc): 75 mg/dL
Non-HDL Cholesterol (Calc): 102 mg/dL (ref <130)
Total CHOL/HDL Ratio: 3.6 (calc) (ref <5.0)
Triglycerides: 177 mg/dL — ABNORMAL HIGH (ref <150)

## 2023-12-07 LAB — HEMOGLOBIN A1C
Hgb A1c MFr Bld: 6.3 %{Hb} — ABNORMAL HIGH (ref <5.7)
Mean Plasma Glucose: 134 mg/dL
eAG (mmol/L): 7.4 mmol/L

## 2023-12-07 LAB — MICROALBUMIN / CREATININE URINE RATIO
Creatinine, Urine: 126 mg/dL (ref 20–320)
Microalb Creat Ratio: 3 mg/g{creat} (ref <30)
Microalb, Ur: 0.4 mg/dL

## 2023-12-07 LAB — PSA: PSA: 0.07 ng/mL (ref <=4.00)

## 2024-02-23 ENCOUNTER — Other Ambulatory Visit: Payer: Self-pay | Admitting: Family Medicine

## 2024-02-25 NOTE — Telephone Encounter (Signed)
 Requested Prescriptions  Pending Prescriptions Disp Refills   losartan  (COZAAR ) 100 MG tablet [Pharmacy Med Name: Losartan  Potassium 100 MG Oral Tablet] 100 tablet 0    Sig: TAKE 1 TABLET BY MOUTH DAILY     Cardiovascular:  Angiotensin Receptor Blockers Failed - 02/25/2024 12:37 PM      Failed - Last BP in normal range    BP Readings from Last 1 Encounters:  12/06/23 (!) 142/86         Passed - Cr in normal range and within 180 days    Creat  Date Value Ref Range Status  12/06/2023 1.24 0.70 - 1.35 mg/dL Final   Creatinine, Urine  Date Value Ref Range Status  12/06/2023 126 20 - 320 mg/dL Final         Passed - K in normal range and within 180 days    Potassium  Date Value Ref Range Status  12/06/2023 4.7 3.5 - 5.3 mmol/L Final         Passed - Patient is not pregnant      Passed - Valid encounter within last 6 months    Recent Outpatient Visits           2 months ago General medical exam   [facility] Austine Lefort, MD   5 months ago Controlled type 2 diabetes mellitus with complication, without long-term current use of insulin [facility])   [employer] [facility] Austine Lefort, MD   8 months ago Controlled type 2 diabetes mellitus with complication, without long-term current use of insulin [facility])   [facility] Austine Lefort, MD   1 year ago Controlled type 2 diabetes mellitus with complication, without long-term current use of insulin [facility])   [facility] Austine Lefort, MD   1 year ago Controlled type 2 diabetes mellitus with complication, without long-term current use of insulin [facility])   [facility] [place], Cisco Crest, MD

## 2024-02-28 ENCOUNTER — Encounter (HOSPITAL_COMMUNITY): Payer: Self-pay

## 2024-03-06 ENCOUNTER — Ambulatory Visit: Payer: Medicare Other | Admitting: Family Medicine

## 2024-03-06 VITALS — BP 136/80 | HR 67 | Temp 98.4°F | Ht 69.0 in | Wt 198.0 lb

## 2024-03-06 DIAGNOSIS — Z7984 Long term (current) use of oral hypoglycemic drugs: Secondary | ICD-10-CM | POA: Diagnosis not present

## 2024-03-06 DIAGNOSIS — E118 Type 2 diabetes mellitus with unspecified complications: Secondary | ICD-10-CM | POA: Diagnosis not present

## 2024-03-06 MED ORDER — CLOTRIMAZOLE-BETAMETHASONE 1-0.05 % EX CREA: 1.0000 | TOPICAL_CREAM | Freq: Every day | CUTANEOUS | 3 refills | Status: AC

## 2024-03-06 NOTE — Progress Notes (Signed)
 Subjective:    Patient ID: Nathaniel Smith, male    DOB: June 27, 1956, 68 y.o.   MRN: [id number]  HPI Patient is a very pleasant 68 year old African-American gentleman who presents today for follow up.  He has diabetes.  Recently, the patient has been developing a rash in the crease between his right upper thigh and his scrotum and onto his perineum.  Is an erythematous scaly rash.  It itches.  It appears to be intertrigo versus jock itch.  He denies any chest pain shortness of breath or dyspnea on exertion.  Blood pressure today is excellent. Past Medical History:  Diagnosis Date   Allergy    Cancer (HCC) 07/02/2010   prostate   Diabetes mellitus without complication (HCC)    Hyperlipidemia 11/26/2009   Hypertension 11/26/2010   Pyrosis    Past Surgical History:  Procedure Laterality Date   CARPAL TUNNEL RELEASE Right 10/22/2006   [facility]   COLONOSCOPY     POLYPECTOMY     PROSTATE SURGERY  09/2010   seeds implant   UPPER GASTROINTESTINAL ENDOSCOPY     Current Outpatient Medications on File Prior to Visit  Medication Sig Dispense Refill   amLODipine  (NORVASC ) 10 MG tablet TAKE 1 TABLET BY MOUTH DAILY 100 tablet 2   atorvastatin  (LIPITOR) 20 MG tablet TAKE 1 TABLET BY MOUTH ONCE  DAILY 100 tablet 2   empagliflozin  (JARDIANCE ) 25 MG TABS tablet Take 1 tablet (25 mg total) by mouth daily before breakfast. 90 tablet 3   fluticasone  (FLONASE ) 50 MCG/ACT nasal spray Place 2 sprays into both nostrils daily. 16 g 0   losartan  (COZAAR ) 100 MG tablet TAKE 1 TABLET BY MOUTH DAILY 100 tablet 0   Magnesium 500 MG TABS Take by mouth.     Multiple Vitamin (MULTIVITAMIN) tablet Take 1 tablet by mouth daily.     olopatadine  (PATANOL) 0.1 % ophthalmic solution Place 1 drop into both eyes 2 (two) times daily. 5 mL 12   sildenafil  (VIAGRA ) 100 MG tablet Take 0.5-1 tablets (50-100 mg total) by mouth daily as needed for erectile dysfunction. 5 tablet 11   triamcinolone  cream (KENALOG ) 0.1 %  Apply 1 Application topically 2 (two) times daily. 30 g 0   No current facility-administered medications on file prior to visit.   No Known Allergies Social History   Socioeconomic History   Marital status: Married    Spouse name: Not on file   Number of children: 1   Years of education: Not on file   Highest education level: Some college, no degree  Occupational History   Not on file  Tobacco Use   Smoking status: Former   Smokeless tobacco: Never   Tobacco comments:    2001 quit  Vaping Use   Vaping status: Never Used  Substance and Sexual Activity   Alcohol use: No   Drug use: No   Sexual activity: Yes    Birth control/protection: Condom  Other Topics Concern   Not on file  Social History Narrative   He drives dump trucks. Hauls rock, dirt, etc.   Divorced. Has one son and one stepdaughter.   :Walks on  [place]mill [place] for 1 mile every morning Monday through Friday.   He smoked 1/2 pack a day starting at age 54 and quit at age 40.--smoked for about 26 years.    Has Smoked none for 13 years.   Social Drivers of Health   Financial Resource Strain: Low Risk  (12/04/2023)   Overall  Financial Resource Strain (CARDIA)    Difficulty of Paying Living Expenses: Not hard at all  Food Insecurity: No Food Insecurity (12/04/2023)   Hunger Vital Sign    Worried About Running Out of Food in the Last Year: Never true    Ran Out of Food in the Last Year: Never true  Transportation Needs: No Transportation Needs (12/04/2023)   PRAPARE - Administrator, Civil Service (Medical): No    Lack of Transportation (Non-Medical): No  Physical Activity: Sufficiently Active (12/04/2023)   Exercise Vital Sign    Days of Exercise per Week: 4 days    Minutes of Exercise per Session: 50 min  Recent Concern: Physical Activity - Insufficiently Active (09/09/2023)   Exercise Vital Sign    Days of Exercise per Week: 4 days    Minutes of Exercise per Session: 30 min  Stress: No Stress  Concern Present (12/04/2023)   [employer] of Occupational Health - Occupational Stress Questionnaire    Feeling of Stress : Not at all  Social Connections: Socially Integrated (12/04/2023)   Social Connection and Isolation Panel [NHANES]    Frequency of Communication with Friends and Family: More than three times a week    Frequency of Social Gatherings with Friends and Family: More than three times a week    Attends Religious Services: More than 4 times per year    Active Member of [employer] or Organizations: Yes    Attends Engineer, structural: More than 4 times per year    Marital Status: Married  Catering manager Violence: Not At Risk (07/11/2023)   Humiliation, Afraid, Rape, and Kick questionnaire    Fear of Current or Ex-Partner: No    Emotionally Abused: No    Physically Abused: No    Sexually Abused: No   Family History  Problem Relation Age of Onset   Diabetes Father    Heart disease Father    Heart disease Brother 80       Stent at 54--CAD   Colon cancer Maternal Uncle    Heart disease Paternal Aunt    Prostate cancer Paternal Uncle    Heart disease Paternal Uncle    Esophageal cancer Neg Hx    Inflammatory bowel disease Neg Hx    Liver disease Neg Hx    Pancreatic cancer Neg Hx    Rectal cancer Neg Hx    Stomach cancer Neg Hx    Colon polyps Neg Hx       Review of Systems  All other systems reviewed and are negative.      Objective:   Physical Exam Vitals reviewed.  Constitutional:      General: He is not in acute distress.    Appearance: He is well-developed. He is not diaphoretic.  HENT:     Head: Normocephalic and atraumatic.     Right Ear: External ear normal.     Left Ear: External ear normal.     Nose: Nose normal.     Mouth/Throat:     Pharynx: No oropharyngeal exudate.  Eyes:     General: No scleral icterus.       Right eye: No discharge.        Left eye: No discharge.     Conjunctiva/sclera: Conjunctivae normal.     Pupils:  Pupils are equal, round, and reactive to light.  Neck:     Thyroid: No thyromegaly.     Vascular: No JVD.     Trachea: No tracheal  deviation.  Cardiovascular:     Rate and Rhythm: Normal rate and regular rhythm.     Heart sounds: Normal heart sounds. No murmur heard.    No friction rub. No gallop.  Pulmonary:     Effort: Pulmonary effort is normal. No respiratory distress.     Breath sounds: Normal breath sounds. No stridor. No wheezing or rales.  Chest:     Chest wall: No tenderness.  Abdominal:     General: Bowel sounds are normal. There is no distension.     Palpations: Abdomen is soft. There is no mass.     Tenderness: There is no abdominal tenderness. There is no guarding or rebound.  Musculoskeletal:        General: No tenderness. Normal range of motion.     Cervical back: Normal range of motion and neck supple.  Lymphadenopathy:     Cervical: No cervical adenopathy.  Skin:    General: Skin is warm.     Coloration: Skin is not pale.     Findings: No erythema or rash.  Neurological:     Mental Status: He is alert and oriented to person, place, and time.     Cranial Nerves: No cranial nerve deficit.     Motor: No abnormal muscle tone.     Coordination: Coordination normal.     Deep Tendon Reflexes: Reflexes are normal and symmetric.  Psychiatric:        Behavior: Behavior normal.        Thought Content: Thought content normal.        Judgment: Judgment normal.   Hyperpigmented macular rash between the right medial upper thigh and the perineum        Assessment & Plan:  Controlled type 2 diabetes mellitus with complication, without long-term current use of insulin (HCC) - Plan: Hemoglobin A1c, Comprehensive metabolic panel with GFR, Lipid panel, Microalbumin/Creatinine Ratio, Urine Blood pressure today is excellent.  I will check CBC CMP lipid panel and urine protein to creatinine ratio.  Goal A1c is less than 7.  Goal LDL cholesterol is less than 100.  If labs are  excellent, patient can come back in 6 months

## 2024-03-07 LAB — COMPREHENSIVE METABOLIC PANEL WITH GFR
AG Ratio: 1.9 (calc) (ref 1.0–2.5)
ALT: 19 U/L (ref 9–46)
AST: 16 U/L (ref 10–35)
Albumin: 4.4 g/dL (ref 3.6–5.1)
Alkaline phosphatase (APISO): 56 U/L (ref 35–144)
BUN: 9 mg/dL (ref 7–25)
CO2: 24 mmol/L (ref 20–32)
Calcium: 9.5 mg/dL (ref 8.6–10.3)
Chloride: 104 mmol/L (ref 98–110)
Creat: 1.32 mg/dL (ref 0.70–1.35)
Globulin: 2.3 g/dL (ref 1.9–3.7)
Glucose, Bld: 144 mg/dL — ABNORMAL HIGH (ref 65–99)
Potassium: 4.2 mmol/L (ref 3.5–5.3)
Sodium: 139 mmol/L (ref 135–146)
Total Bilirubin: 0.4 mg/dL (ref 0.2–1.2)
Total Protein: 6.7 g/dL (ref 6.1–8.1)
eGFR: 59 mL/min/{1.73_m2} — ABNORMAL LOW (ref >=60)

## 2024-03-07 LAB — LIPID PANEL
Cholesterol: 157 mg/dL (ref <200)
HDL: 41 mg/dL (ref >=40)
LDL Cholesterol (Calc): 95 mg/dL
Non-HDL Cholesterol (Calc): 116 mg/dL (ref <130)
Total CHOL/HDL Ratio: 3.8 (calc) (ref <5.0)
Triglycerides: 114 mg/dL (ref <150)

## 2024-03-07 LAB — MICROALBUMIN / CREATININE URINE RATIO
Creatinine, Urine: 61 mg/dL (ref 20–320)
Microalb, Ur: <0.2 mg/dL

## 2024-03-07 LAB — HEMOGLOBIN A1C
Hgb A1c MFr Bld: 6.4 % — ABNORMAL HIGH (ref <5.7)
Mean Plasma Glucose: 137 mg/dL
eAG (mmol/L): 7.6 mmol/L

## 2024-03-09 ENCOUNTER — Ambulatory Visit: Payer: Self-pay | Admitting: Family Medicine

## 2024-03-20 ENCOUNTER — Other Ambulatory Visit: Payer: Self-pay | Admitting: Family Medicine

## 2024-03-21 NOTE — Telephone Encounter (Signed)
 Requested Prescriptions  Pending Prescriptions Disp Refills   empagliflozin  (JARDIANCE ) 25 MG TABS tablet [Pharmacy Med Name: Jardiance  25 MG Oral Tablet] 100 tablet 1    Sig: TAKE 1 TABLET BY MOUTH DAILY  BEFORE BREAKFAST     Endocrinology:  Diabetes - SGLT2 Inhibitors Failed - 03/21/2024  5:31 PM      Failed - eGFR in normal range and within 360 days    GFR, Est African American  Date Value Ref Range Status  11/29/2020 78 > OR = 60 mL/min/1.51m2 Final   GFR, Est Non African American  Date Value Ref Range Status  11/29/2020 67 > OR = 60 mL/min/1.38m2 Final   eGFR  Date Value Ref Range Status  03/06/2024 59 (L) > OR = 60 mL/min/1.53m2 Final         Passed - Cr in normal range and within 360 days    Creat  Date Value Ref Range Status  03/06/2024 1.32 0.70 - 1.35 mg/dL Final   Creatinine, Urine  Date Value Ref Range Status  03/06/2024 61 20 - 320 mg/dL Final         Passed - HBA1C is between 0 and 7.9 and within 180 days    Hgb A1c MFr Bld  Date Value Ref Range Status  03/06/2024 6.4 (H) <5.7 % Final    Comment:    For someone without known diabetes, a hemoglobin  A1c value between 5.7% and 6.4% is consistent with prediabetes and should be confirmed with a  follow-up test. . For someone with known diabetes, a value <7% indicates that their diabetes is well controlled. A1c targets should be individualized based on duration of diabetes, age, comorbid conditions, and other considerations. . This assay result is consistent with an increased risk of diabetes. . Currently, no consensus exists regarding use of hemoglobin A1c for diagnosis of diabetes for children. Temple Feeler - Valid encounter within last 6 months    Recent Outpatient Visits           2 weeks ago Controlled type 2 diabetes mellitus with complication, without long-term current use of insulin (HCC)   [facility] Alessio, Cisco Crest, MD   3 months ago General medical  exam   [facility] Austine Lefort, MD   6 months ago Controlled type 2 diabetes mellitus with complication, without long-term current use of insulin [facility])   [facility] Austine Lefort, MD   9 months ago Controlled type 2 diabetes mellitus with complication, without long-term current use of insulin [facility])   [facility] Austine Lefort, MD   1 year ago Controlled type 2 diabetes mellitus with complication, without long-term current use of insulin [facility])   [facility] Wooding, Cisco Crest, MD               losartan  (COZAAR ) 100 MG tablet [Pharmacy Med Name: Losartan  Potassium 100 MG Oral Tablet] 100 tablet 1    Sig: TAKE 1 TABLET BY MOUTH DAILY     Cardiovascular:  Angiotensin Receptor Blockers Passed - 03/21/2024  5:31 PM      Passed - Cr in normal range and within 180 days    Creat  Date Value Ref Range Status  03/06/2024 1.32 0.70 - 1.35 mg/dL Final   Creatinine, Urine  Date Value Ref Range Status  03/06/2024 61  20 - 320 mg/dL Final         Passed - K in normal range and within 180 days    Potassium  Date Value Ref Range Status  03/06/2024 4.2 3.5 - 5.3 mmol/L Final         Passed - Patient is not pregnant      Passed - Last BP in normal range    BP Readings from Last 1 Encounters:  03/06/24 136/80         Passed - Valid encounter within last 6 months    Recent Outpatient Visits           2 weeks ago Controlled type 2 diabetes mellitus with complication, without long-term current use of insulin (HCC)   [facility], Cisco Crest, MD   3 months ago General medical exam   [facility] Austine Lefort, MD   6 months ago Controlled type 2 diabetes mellitus with complication, without long-term current use of insulin [facility])   [facility] Austine Lefort, MD   9  months ago Controlled type 2 diabetes mellitus with complication, without long-term current use of insulin [facility])   [place] Austine Lefort, MD   1 year ago Controlled type 2 diabetes mellitus with complication, without long-term current use of insulin [facility])   [facility] Derrington, Cisco Crest, MD

## 2024-04-29 ENCOUNTER — Ambulatory Visit: Payer: Self-pay

## 2024-04-29 NOTE — Telephone Encounter (Signed)
 FYI Only or Action Required?: FYI only for provider.  Patient was last seen in primary care on 03/06/2024 by Duanne Butler DASEN, MD.  Called Nurse Triage reporting Rash.  Symptoms began Unknown .  Interventions attempted: Other: Unknown .  Symptoms are: Unknown .  Triage Disposition: See PCP Within 2 Weeks  Patient/caregiver understands and will follow disposition?: Yes  **Appt. Scheduled for 7/10-Declined Triage**                Copied from CRM [id number]. Topic: Clinical - Red Word Triage >> Apr 29, 2024  4:23 PM Ivette P wrote: Red Word that prompted transfer to Nurse Triage: Rash, medication is not working. Reason for Disposition  Requesting regular office appointment  Answer Assessment - Initial Assessment Questions 1. REASON FOR CALL: What is the main reason for your call? or How can I best help you?   Patient declined triage and only wanted an appt. Scheduled. He is scheduled for 7/10  Protocols used: Information Only Call - No Triage-A-AH

## 2024-04-30 ENCOUNTER — Encounter: Payer: Self-pay | Admitting: Family Medicine

## 2024-04-30 ENCOUNTER — Ambulatory Visit (INDEPENDENT_AMBULATORY_CARE_PROVIDER_SITE_OTHER): Admitting: Family Medicine

## 2024-04-30 VITALS — BP 132/82 | HR 68 | Temp 98.2°F | Ht 69.0 in | Wt 200.2 lb

## 2024-04-30 DIAGNOSIS — L304 Erythema intertrigo: Secondary | ICD-10-CM | POA: Diagnosis not present

## 2024-04-30 DIAGNOSIS — L0390 Cellulitis, unspecified: Secondary | ICD-10-CM | POA: Diagnosis not present

## 2024-04-30 MED ORDER — NYSTATIN-TRIAMCINOLONE 100000-0.1 UNIT/GM-% EX OINT: 1.0000 | TOPICAL_OINTMENT | Freq: Three times a day (TID) | CUTANEOUS | 3 refills | Status: DC

## 2024-04-30 MED ORDER — CEPHALEXIN 500 MG PO CAPS
500.0000 mg | ORAL_CAPSULE | Freq: Three times a day (TID) | ORAL | 0 refills | Status: DC
Start: 2024-04-30 — End: 2024-09-11

## 2024-04-30 NOTE — Progress Notes (Signed)
 Subjective:    Patient ID: Nathaniel Smith, male    DOB: [birth year], 68 y.o.   MRN: [id number]  Rash   Patient continues to complain of an erythematous scaly rash.  The rash is located in the crease between his perineum and his upper thigh.  The right side of the left side.  There is hyperpigmentation on the left side and a lens shaped distribution on either side of the crease.  This is consistent with intertrigo.  On the right side, the skin is erythematous and tender to the touch and appears to have a secondary cellulitis.  He has been using Lotrisone  twice daily with no benefit Past Medical History:  Diagnosis Date   Allergy    Cancer (HCC) 07/02/2010   prostate   Diabetes mellitus without complication (HCC)    Hyperlipidemia 11/26/2009   Hypertension 11/26/2010   Pyrosis    Past Surgical History:  Procedure Laterality Date   CARPAL TUNNEL RELEASE Right 10/22/2006   [facility]   COLONOSCOPY     POLYPECTOMY     PROSTATE SURGERY  09/2010   seeds implant   UPPER GASTROINTESTINAL ENDOSCOPY     Current Outpatient Medications on File Prior to Visit  Medication Sig Dispense Refill   amLODipine  (NORVASC ) 10 MG tablet TAKE 1 TABLET BY MOUTH DAILY 100 tablet 2   atorvastatin  (LIPITOR) 20 MG tablet TAKE 1 TABLET BY MOUTH ONCE  DAILY 100 tablet 2   clotrimazole -betamethasone  (LOTRISONE ) cream Apply 1 Application topically daily. 30 g 3   empagliflozin  (JARDIANCE ) 25 MG TABS tablet TAKE 1 TABLET BY MOUTH DAILY  BEFORE BREAKFAST 100 tablet 1   fluticasone  (FLONASE ) 50 MCG/ACT nasal spray Place 2 sprays into both nostrils daily. 16 g 0   ibuprofen  (ADVIL ) 800 MG tablet Take 800 mg by mouth every 6 (six) hours as needed.     losartan  (COZAAR ) 100 MG tablet TAKE 1 TABLET BY MOUTH DAILY 100 tablet 1   Magnesium 500 MG TABS Take by mouth.     Multiple Vitamin (MULTIVITAMIN) tablet Take 1 tablet by mouth daily.     olopatadine  (PATANOL) 0.1 % ophthalmic solution Place 1 drop into both  eyes 2 (two) times daily. 5 mL 12   sildenafil  (VIAGRA ) 100 MG tablet Take 0.5-1 tablets (50-100 mg total) by mouth daily as needed for erectile dysfunction. 5 tablet 11   triamcinolone  cream (KENALOG ) 0.1 % Apply 1 Application topically 2 (two) times daily. 30 g 0   No current facility-administered medications on file prior to visit.   No Known Allergies Social History   Socioeconomic History   Marital status: Married    Spouse name: Not on file   Number of children: 1   Years of education: Not on file   Highest education level: Some college, no degree  Occupational History   Not on file  Tobacco Use   Smoking status: Former   Smokeless tobacco: Never   Tobacco comments:    2001 quit  Vaping Use   Vaping status: Never Used  Substance and Sexual Activity   Alcohol use: No   Drug use: No   Sexual activity: Yes    Birth control/protection: Condom  Other Topics Concern   Not on file  Social History Narrative   He drives dump trucks. Hauls rock, dirt, etc.   Divorced. Has one son and one stepdaughter.   :Walks on  [place]readmill mill for 1 mile every morning Monday through Friday.   He smoked 1/2  pack a day starting at age 57 and quit at age 1.--smoked for about 26 years.    Has Smoked none for 13 years.   Social Drivers of Corporate investment banker Strain: Low Risk  (12/04/2023)   Overall Financial Resource Strain (CARDIA)    Difficulty of Paying Living Expenses: Not hard at all  Food Insecurity: No Food Insecurity (12/04/2023)   Hunger Vital Sign    Worried About Running Out of Food in the Last Year: Never true    Ran Out of Food in the Last Year: Never true  Transportation Needs: No Transportation Needs (12/04/2023)   PRAPARE - Administrator, Civil Service (Medical): No    Lack of Transportation (Non-Medical): No  Physical Activity: Sufficiently Active (12/04/2023)   Exercise Vital Sign    Days of Exercise per Week: 4 days    Minutes of Exercise per Session:  50 min  Recent Concern: Physical Activity - Insufficiently Active (09/09/2023)   Exercise Vital Sign    Days of Exercise per Week: 4 days    Minutes of Exercise per Session: 30 min  Stress: No Stress Concern Present (12/04/2023)   [employer] of Occupational Health - Occupational Stress Questionnaire    Feeling of Stress : Not at all  Social Connections: Socially Integrated (12/04/2023)   Social Connection and Isolation Panel    Frequency of Communication with Friends and Family: More than three times a week    Frequency of Social Gatherings with Friends and Family: More than three times a week    Attends Religious Services: More than 4 times per year    Active Member of [employer] or Organizations: Yes    Attends Engineer, structural: More than 4 times per year    Marital Status: Married  Catering manager Violence: Not At Risk (07/11/2023)   Humiliation, Afraid, Rape, and Kick questionnaire    Fear of Current or Ex-Partner: No    Emotionally Abused: No    Physically Abused: No    Sexually Abused: No   Family History  Problem Relation Age of Onset   Diabetes Father    Heart disease Father    Heart disease Brother 70       Stent at 54--CAD   Colon cancer Maternal Uncle    Heart disease Paternal Aunt    Prostate cancer Paternal Uncle    Heart disease Paternal Uncle    Esophageal cancer Neg Hx    Inflammatory bowel disease Neg Hx    Liver disease Neg Hx    Pancreatic cancer Neg Hx    Rectal cancer Neg Hx    Stomach cancer Neg Hx    Colon polyps Neg Hx       Review of Systems  Skin:  Positive for rash.  All other systems reviewed and are negative.      Objective:   Physical Exam Vitals reviewed.  Constitutional:      General: He is not in acute distress.    Appearance: He is well-developed. He is not diaphoretic.  HENT:     Head: Normocephalic and atraumatic.     Right Ear: External ear normal.     Left Ear: External ear normal.     Nose: Nose normal.      Mouth/Throat:     Pharynx: No oropharyngeal exudate.  Eyes:     General: No scleral icterus.       Right eye: No discharge.  Left eye: No discharge.     Conjunctiva/sclera: Conjunctivae normal.     Pupils: Pupils are equal, round, and reactive to light.  Neck:     Thyroid: No thyromegaly.     Vascular: No JVD.     Trachea: No tracheal deviation.  Cardiovascular:     Rate and Rhythm: Normal rate and regular rhythm.     Heart sounds: Normal heart sounds. No murmur heard.    No friction rub. No gallop.  Pulmonary:     Effort: Pulmonary effort is normal. No respiratory distress.     Breath sounds: Normal breath sounds. No stridor. No wheezing or rales.  Chest:     Chest wall: No tenderness.  Abdominal:     General: Bowel sounds are normal. There is no distension.     Palpations: Abdomen is soft. There is no mass.     Tenderness: There is no abdominal tenderness. There is no guarding or rebound.  Genitourinary:   Musculoskeletal:        General: No tenderness. Normal range of motion.     Cervical back: Normal range of motion and neck supple.  Lymphadenopathy:     Cervical: No cervical adenopathy.  Skin:    General: Skin is warm.     Coloration: Skin is not pale.     Findings: Erythema and rash present. Rash is macular and scaling.  Neurological:     Mental Status: He is alert and oriented to person, place, and time.     Cranial Nerves: No cranial nerve deficit.     Motor: No abnormal muscle tone.     Coordination: Coordination normal.     Deep Tendon Reflexes: Reflexes are normal and symmetric.  Psychiatric:        Behavior: Behavior normal.        Thought Content: Thought content normal.        Judgment: Judgment normal.   Hyperpigmented macular rash between the right medial upper thigh and the perineum        Assessment & Plan:  Intertrigo  Cellulitis, unspecified cellulitis site Treat secondary cellulitis with Keflex  500 mg 3 times daily for 7 days.   Use nystatin /triamcinolone  2-3 times a day on the affected area for 2 weeks.  We then discussed strategies to help prevent this in the future including applying powder in that area to act as a desiccant.  Recommended he wear loosefitting clothing such as shorts to facilitate airflow to help keep that area dry and would probably sweat moisture

## 2024-05-04 ENCOUNTER — Other Ambulatory Visit (HOSPITAL_COMMUNITY): Payer: Self-pay

## 2024-05-04 ENCOUNTER — Telehealth: Payer: Self-pay | Admitting: Pharmacy Technician

## 2024-05-04 NOTE — Telephone Encounter (Signed)
 Pharmacy Patient Advocate Encounter   Received notification from [employer]base that prior authorization for Nystatin -Triamcinolone  100000-0.1UNIT/GM-% ointment is required/requested.   Insurance verification completed.   The patient is insured through [facility] .   Per test claim: PA required; PA submitted to above mentioned insurance via CoverMyMeds Key/confirmation #/EOC [id number] Status is pending

## 2024-05-05 NOTE — Telephone Encounter (Signed)
 Pharmacy Patient Advocate Encounter  Received notification from [employer] that Prior Authorization for Nystatin -Triamcinolone  100000-0.1UNIT/GM-% ointment has been DENIED.  Full denial letter will be uploaded to the media tab. See denial reason below.     PA #/Case ID/Reference #: [id number]

## 2024-07-16 ENCOUNTER — Ambulatory Visit (INDEPENDENT_AMBULATORY_CARE_PROVIDER_SITE_OTHER): Admitting: Family Medicine

## 2024-07-16 ENCOUNTER — Ambulatory Visit: Payer: Medicare Other

## 2024-07-16 ENCOUNTER — Encounter: Payer: Self-pay | Admitting: Family Medicine

## 2024-07-16 VITALS — BP 120/72 | HR 67 | Temp 97.6°F | Ht 69.0 in | Wt 202.0 lb

## 2024-07-16 DIAGNOSIS — S76211A Strain of adductor muscle, fascia and tendon of right thigh, initial encounter: Secondary | ICD-10-CM

## 2024-07-16 DIAGNOSIS — Z23 Encounter for immunization: Secondary | ICD-10-CM | POA: Diagnosis not present

## 2024-07-16 NOTE — Progress Notes (Signed)
 Subjective:    Patient ID: Nathaniel Smith, male    DOB: 1955-11-02, 68 y.o.   MRN: [id number]  Rash  04/30/24 Patient continues to complain of an erythematous scaly rash.  The rash is located in the crease between his perineum and his upper thigh.  The right side is worse than the left side.  There is hyperpigmentation on the left side in a lens shaped distribution on either side of the inguinal crease.  This is consistent with intertrigo.  On the right side, the skin is erythematous and tender to the touch and appears to have a secondary cellulitis.  He has been using Lotrisone  twice daily with no benefit.  At that time, my plan was: Treat secondary cellulitis with Keflex  500 mg 3 times daily for 7 days.  Use nystatin /triamcinolone  2-3 times a day on the affected area for 2 weeks.  We then discussed strategies to help prevent this in the future including applying powder in that area to act as a desiccant.  Recommended he wear loosefitting clothing such as shorts to facilitate airflow to help keep that area dry and would prevent moisture.  07/16/24 The rash is essentially resolved.  There is very mild erythema on the right side which is even difficult to see.  There is no redness or rash on the left side.  However the patient is having pain in his right groin.  The pain is deeper adjacent to the scrotum.  The muscular tendon in that area of the hip abductor is tender to palpation. Past Medical History:  Diagnosis Date   Allergy    Cancer (HCC) 07/02/2010   prostate   Diabetes mellitus without complication (HCC)    Hyperlipidemia 11/26/2009   Hypertension 11/26/2010   Pyrosis    Past Surgical History:  Procedure Laterality Date   CARPAL TUNNEL RELEASE Right 10/22/2006   [facility]   COLONOSCOPY     POLYPECTOMY     PROSTATE SURGERY  09/2010   seeds implant   UPPER GASTROINTESTINAL ENDOSCOPY     Current Outpatient Medications on File Prior to Visit  Medication Sig Dispense Refill    amLODipine  (NORVASC ) 10 MG tablet TAKE 1 TABLET BY MOUTH DAILY 100 tablet 2   atorvastatin  (LIPITOR) 20 MG tablet TAKE 1 TABLET BY MOUTH ONCE  DAILY 100 tablet 2   cephALEXin  (KEFLEX ) 500 MG capsule Take 1 capsule (500 mg total) by mouth 3 (three) times daily. 21 capsule 0   clotrimazole -betamethasone  (LOTRISONE ) cream Apply 1 Application topically daily. 30 g 3   empagliflozin  (JARDIANCE ) 25 MG TABS tablet TAKE 1 TABLET BY MOUTH DAILY  BEFORE BREAKFAST 100 tablet 1   fluticasone  (FLONASE ) 50 MCG/ACT nasal spray Place 2 sprays into both nostrils daily. 16 g 0   ibuprofen  (ADVIL ) 800 MG tablet Take 800 mg by mouth every 6 (six) hours as needed.     losartan  (COZAAR ) 100 MG tablet TAKE 1 TABLET BY MOUTH DAILY 100 tablet 1   Magnesium 500 MG TABS Take by mouth.     Multiple Vitamin (MULTIVITAMIN) tablet Take 1 tablet by mouth daily.     nystatin -triamcinolone  ointment (MYCOLOG) Apply 1 Application topically 3 (three) times daily. 30 g 3   olopatadine  (PATANOL) 0.1 % ophthalmic solution Place 1 drop into both eyes 2 (two) times daily. 5 mL 12   sildenafil  (VIAGRA ) 100 MG tablet Take 0.5-1 tablets (50-100 mg total) by mouth daily as needed for erectile dysfunction. 5 tablet 11   triamcinolone  cream (  KENALOG ) 0.1 % Apply 1 Application topically 2 (two) times daily. 30 g 0   No current facility-administered medications on file prior to visit.   No Known Allergies Social History   Socioeconomic History   Marital status: Married    Spouse name: Not on file   Number of children: 1   Years of education: Not on file   Highest education level: Some college, no degree  Occupational History   Not on file  Tobacco Use   Smoking status: Former   Smokeless tobacco: Never   Tobacco comments:    2001 quit  Vaping Use   Vaping status: Never Used  Substance and Sexual Activity   Alcohol use: No   Drug use: No   Sexual activity: Yes    Birth control/protection: Condom  Other Topics Concern   Not  on file  Social History Narrative   He drives dump trucks. Hauls rock, dirt, etc.   Divorced. Has one son and one stepdaughter.   :Walks on  Treadmill mill for 1 mile every morning Monday through Friday.   He smoked 1/2 pack a day starting at age 2 and quit at age 85.--smoked for about 26 years.    Has Smoked none for 13 years.   Social Drivers of Corporate investment banker Strain: Low Risk  (12/04/2023)   Overall Financial Resource Strain (CARDIA)    Difficulty of Paying Living Expenses: Not hard at all  Food Insecurity: No Food Insecurity (12/04/2023)   Hunger Vital Sign    Worried About Running Out of Food in the Last Year: Never true    Ran Out of Food in the Last Year: Never true  Transportation Needs: No Transportation Needs (12/04/2023)   PRAPARE - Administrator, Civil Service (Medical): No    Lack of Transportation (Non-Medical): No  Physical Activity: Sufficiently Active (12/04/2023)   Exercise Vital Sign    Days of Exercise per Week: 4 days    Minutes of Exercise per Session: 50 min  Recent Concern: Physical Activity - Insufficiently Active (09/09/2023)   Exercise Vital Sign    Days of Exercise per Week: 4 days    Minutes of Exercise per Session: 30 min  Stress: No Stress Concern Present (12/04/2023)   [employer] of Occupational Health - Occupational Stress Questionnaire    Feeling of Stress : Not at all  Social Connections: Socially Integrated (12/04/2023)   Social Connection and Isolation Panel    Frequency of Communication with Friends and Family: More than three times a week    Frequency of Social Gatherings with Friends and Family: More than three times a week    Attends Religious Services: More than 4 times per year    Active Member of [employer] or Organizations: Yes    Attends Banker Meetings: More than 4 times per year    Marital Status: Married  Catering manager Violence: Not At Risk (07/11/2023)   Humiliation, Afraid, Rape, and Kick  questionnaire    Fear of Current or Ex-Partner: No    Emotionally Abused: No    Physically Abused: No    Sexually Abused: No   Family History  Problem Relation Age of Onset   Diabetes Father    Heart disease Father    Heart disease Brother 83       Stent at 54--CAD   Colon cancer Maternal Uncle    Heart disease Paternal Aunt    Prostate cancer Paternal Uncle  Heart disease Paternal Uncle    Esophageal cancer Neg Hx    Inflammatory bowel disease Neg Hx    Liver disease Neg Hx    Pancreatic cancer Neg Hx    Rectal cancer Neg Hx    Stomach cancer Neg Hx    Colon polyps Neg Hx       Review of Systems  Skin:  Positive for rash.  All other systems reviewed and are negative.      Objective:   Physical Exam Vitals reviewed.  Constitutional:      General: He is not in acute distress.    Appearance: He is well-developed. He is not diaphoretic.  HENT:     Head: Normocephalic and atraumatic.     Right Ear: External ear normal.     Left Ear: External ear normal.     Nose: Nose normal.     Mouth/Throat:     Pharynx: No oropharyngeal exudate.  Eyes:     General: No scleral icterus.       Right eye: No discharge.        Left eye: No discharge.     Conjunctiva/sclera: Conjunctivae normal.     Pupils: Pupils are equal, round, and reactive to light.  Neck:     Thyroid: No thyromegaly.     Vascular: No JVD.     Trachea: No tracheal deviation.  Cardiovascular:     Rate and Rhythm: Normal rate and regular rhythm.     Heart sounds: Normal heart sounds. No murmur heard.    No friction rub. No gallop.  Pulmonary:     Effort: Pulmonary effort is normal. No respiratory distress.     Breath sounds: Normal breath sounds. No stridor. No wheezing or rales.  Chest:     Chest wall: No tenderness.  Abdominal:     General: Bowel sounds are normal. There is no distension.     Palpations: Abdomen is soft. There is no mass.     Tenderness: There is no abdominal tenderness. There is no  guarding or rebound.  Genitourinary:   Musculoskeletal:        General: Normal range of motion.     Cervical back: Normal range of motion and neck supple.     Right hip: Tenderness present. No bony tenderness or crepitus. Normal range of motion. Normal strength.  Lymphadenopathy:     Cervical: No cervical adenopathy.  Skin:    General: Skin is warm.     Coloration: Skin is not pale.     Findings: No erythema or rash.  Neurological:     Mental Status: He is alert and oriented to person, place, and time.     Cranial Nerves: No cranial nerve deficit.     Motor: No abnormal muscle tone.     Coordination: Coordination normal.     Deep Tendon Reflexes: Reflexes are normal and symmetric.  Psychiatric:        Behavior: Behavior normal.        Thought Content: Thought content normal.        Judgment: Judgment normal.         Assessment & Plan:  Inguinal strain, right, initial encounter I believe this is a groin strain.  Recommended activity modification for 2 weeks to see if the pain improves.  Patient pulls himself into the high Of the dump truck every day by stepping up with his right hip.  I recommend changing that activity and see if the pain is better.  If not, consider imaging of the pelvis

## 2024-09-10 ENCOUNTER — Ambulatory Visit: Admitting: Family Medicine

## 2024-09-11 ENCOUNTER — Encounter: Payer: Self-pay | Admitting: Family Medicine

## 2024-09-11 ENCOUNTER — Other Ambulatory Visit: Payer: Self-pay | Admitting: Family Medicine

## 2024-09-11 ENCOUNTER — Ambulatory Visit: Admitting: Family Medicine

## 2024-09-11 VITALS — BP 124/82 | HR 80 | Temp 97.9°F | Ht 69.0 in | Wt 205.8 lb

## 2024-09-11 DIAGNOSIS — Z7984 Long term (current) use of oral hypoglycemic drugs: Secondary | ICD-10-CM

## 2024-09-11 DIAGNOSIS — I10 Essential (primary) hypertension: Secondary | ICD-10-CM

## 2024-09-11 DIAGNOSIS — M25551 Pain in right hip: Secondary | ICD-10-CM | POA: Diagnosis not present

## 2024-09-11 DIAGNOSIS — E1159 Type 2 diabetes mellitus with other circulatory complications: Secondary | ICD-10-CM | POA: Diagnosis not present

## 2024-09-11 DIAGNOSIS — I152 Hypertension secondary to endocrine disorders: Secondary | ICD-10-CM

## 2024-09-11 DIAGNOSIS — E118 Type 2 diabetes mellitus with unspecified complications: Secondary | ICD-10-CM

## 2024-09-11 MED ORDER — CELECOXIB 200 MG PO CAPS: 200.0000 mg | ORAL_CAPSULE | Freq: Two times a day (BID) | ORAL | 0 refills | Status: DC

## 2024-09-11 NOTE — Progress Notes (Signed)
 Subjective:    Patient ID: Nathaniel Smith, male    DOB: 1955/12/01, 68 y.o.   MRN: [id number]  HPI Patient is a very pleasant 68 year old African-American gentleman who presents today for follow up.  He has diabetes currently managed with jardiance .  He also has hypertension currently managed with amlodipine  and losartan .  Patient's blood pressure is outstanding today at 124/82.  Overall he has been doing well.  He does complain of some pain in his right lateral hip.  The pain is primarily over the greater trochanter and the superior iliac crest.  I believe he has bursitis.  He complains of pain when he lays on that side at night.  Is been hurting for about a month.  He does have a history of prostate cancer so would like to get an x-ray just to evaluate that area.  But otherwise he is doing well.  His immunizations are are listed below.  Colonoscopy is up-to-date and PSA was checked in February and was undetectable Immunization History  Administered Date(s) Administered   Fluad Trivalent(High Dose 65+) 09/09/2023   INFLUENZA, HIGH DOSE SEASONAL PF 07/26/2021, 07/16/2024   Influenza Split 09/21/2015   Influenza,inj,Quad PF,6+ Mos 11/01/2016, 10/03/2017, 09/05/2018, 07/03/2019, 08/26/2020   Influenza-Unspecified 08/28/2013, 09/21/2015, 08/14/2018, 07/26/2021, 07/26/2022   Moderna Covid-19 Fall Seasonal Vaccine 79yrs & older 07/26/2022   Moderna Sars-Covid-2 Vaccination 12/05/2019, 01/02/2020, 09/02/2020, 02/06/2021, 07/26/2021   PNEUMOCOCCAL CONJUGATE-20 12/04/2022   Pfizer(Comirnaty)Fall Seasonal Vaccine 12 years and older 07/26/2022   Tdap 12/08/2014   Zoster Recombinant(Shingrix) 12/28/2020, 06/05/2021   All of his immunizations are up to date. Past Medical History:  Diagnosis Date   Allergy    Cancer (HCC) 07/02/2010   prostate   Diabetes mellitus without complication (HCC)    Hyperlipidemia 11/26/2009   Hypertension 11/26/2010   Pyrosis    Past Surgical History:  Procedure  Laterality Date   CARPAL TUNNEL RELEASE Right 10/22/2006   [facility]   COLONOSCOPY     POLYPECTOMY     PROSTATE SURGERY  09/2010   seeds implant   UPPER GASTROINTESTINAL ENDOSCOPY     Current Outpatient Medications on File Prior to Visit  Medication Sig Dispense Refill   amLODipine  (NORVASC ) 10 MG tablet TAKE 1 TABLET BY MOUTH DAILY 100 tablet 2   atorvastatin  (LIPITOR) 20 MG tablet TAKE 1 TABLET BY MOUTH ONCE  DAILY 100 tablet 2   clotrimazole -betamethasone  (LOTRISONE ) cream Apply 1 Application topically daily. 30 g 3   empagliflozin  (JARDIANCE ) 25 MG TABS tablet TAKE 1 TABLET BY MOUTH DAILY  BEFORE BREAKFAST 100 tablet 1   losartan  (COZAAR ) 100 MG tablet TAKE 1 TABLET BY MOUTH DAILY 100 tablet 1   Magnesium 500 MG TABS Take by mouth.     Multiple Vitamin (MULTIVITAMIN) tablet Take 1 tablet by mouth daily.     sildenafil  (VIAGRA ) 100 MG tablet Take 0.5-1 tablets (50-100 mg total) by mouth daily as needed for erectile dysfunction. 5 tablet 11   No current facility-administered medications on file prior to visit.     No Known Allergies Social History   Socioeconomic History   Marital status: Married    Spouse name: Not on file   Number of children: 1   Years of education: Not on file   Highest education level: Some college, no degree  Occupational History   Not on file  Tobacco Use   Smoking status: Former   Smokeless tobacco: Never   Tobacco comments:    2001 quit  Vaping  Use   Vaping status: Never Used  Substance and Sexual Activity   Alcohol use: No   Drug use: No   Sexual activity: Yes    Birth control/protection: Condom  Other Topics Concern   Not on file  Social History Narrative   He drives dump trucks. Hauls rock, dirt, etc.   Divorced. Has one son and one stepdaughter.   :Walks on  Treadmill mill for 1 mile every morning Monday through Friday.   He smoked 1/2 pack a day starting at age 56 and quit at age 67.--smoked for about 26 years.    Has Smoked  none for 13 years.   Social Drivers of Corporate Investment Banker Strain: Low Risk  (09/10/2024)   Overall Financial Resource Strain (CARDIA)    Difficulty of Paying Living Expenses: Not hard at all  Food Insecurity: No Food Insecurity (09/10/2024)   Hunger Vital Sign    Worried About Running Out of Food in the Last Year: Never true    Ran Out of Food in the Last Year: Never true  Transportation Needs: No Transportation Needs (09/10/2024)   PRAPARE - Administrator, Civil Service (Medical): No    Lack of Transportation (Non-Medical): No  Physical Activity: Sufficiently Active (09/10/2024)   Exercise Vital Sign    Days of Exercise per Week: 4 days    Minutes of Exercise per Session: 40 min  Stress: No Stress Concern Present (09/10/2024)   [employer] of Occupational Health - Occupational Stress Questionnaire    Feeling of Stress: Not at all  Social Connections: Socially Integrated (09/10/2024)   Social Connection and Isolation Panel    Frequency of Communication with Friends and Family: More than three times a week    Frequency of Social Gatherings with Friends and Family: More than three times a week    Attends Religious Services: More than 4 times per year    Active Member of [employer] or Organizations: Yes    Attends Engineer, Structural: More than 4 times per year    Marital Status: Married  Catering Manager Violence: Not At Risk (07/11/2023)   Humiliation, Afraid, Rape, and Kick questionnaire    Fear of Current or Ex-Partner: No    Emotionally Abused: No    Physically Abused: No    Sexually Abused: No   Family History  Problem Relation Age of Onset   Diabetes Father    Heart disease Father    Heart disease Brother 20       Stent at 54--CAD   Colon cancer Maternal Uncle    Heart disease Paternal Aunt    Prostate cancer Paternal Uncle    Heart disease Paternal Uncle    Esophageal cancer Neg Hx    Inflammatory bowel disease Neg Hx    Liver  disease Neg Hx    Pancreatic cancer Neg Hx    Rectal cancer Neg Hx    Stomach cancer Neg Hx    Colon polyps Neg Hx       Review of Systems  All other systems reviewed and are negative.      Objective:   Physical Exam Vitals reviewed.  Constitutional:      General: He is not in acute distress.    Appearance: He is well-developed. He is not diaphoretic.  HENT:     Head: Normocephalic and atraumatic.     Right Ear: External ear normal.     Left Ear: External ear normal.  Nose: Nose normal.     Mouth/Throat:     Pharynx: No oropharyngeal exudate.  Eyes:     General: No scleral icterus.       Right eye: No discharge.        Left eye: No discharge.     Conjunctiva/sclera: Conjunctivae normal.     Pupils: Pupils are equal, round, and reactive to light.  Neck:     Thyroid: No thyromegaly.     Vascular: No JVD.     Trachea: No tracheal deviation.  Cardiovascular:     Rate and Rhythm: Normal rate and regular rhythm.     Heart sounds: Normal heart sounds. No murmur heard.    No friction rub. No gallop.  Pulmonary:     Effort: Pulmonary effort is normal. No respiratory distress.     Breath sounds: Normal breath sounds. No stridor. No wheezing or rales.  Chest:     Chest wall: No tenderness.  Abdominal:     General: Bowel sounds are normal. There is no distension.     Palpations: Abdomen is soft. There is no mass.     Tenderness: There is no abdominal tenderness. There is no guarding or rebound.  Musculoskeletal:        General: No tenderness. Normal range of motion.     Cervical back: Normal range of motion and neck supple.  Lymphadenopathy:     Cervical: No cervical adenopathy.  Skin:    General: Skin is warm.     Coloration: Skin is not pale.     Findings: No erythema or rash.  Neurological:     Mental Status: He is alert and oriented to person, place, and time.     Cranial Nerves: No cranial nerve deficit.     Motor: No abnormal muscle tone.     Coordination:  Coordination normal.     Deep Tendon Reflexes: Reflexes are normal and symmetric.  Psychiatric:        Behavior: Behavior normal.        Thought Content: Thought content normal.        Judgment: Judgment normal.          Assessment & Plan:  Controlled type 2 diabetes mellitus with complication, without long-term current use of insulin (HCC) - Plan: Hemoglobin A1c, CBC with Differential/Platelet, Comprehensive metabolic panel with GFR, Lipid panel, Microalbumin / creatinine urine ratio  Benign essential HTN - Plan: Hemoglobin A1c, CBC with Differential/Platelet, Comprehensive metabolic panel with GFR, Lipid panel, Microalbumin / creatinine urine ratio  Pain of right hip - Plan: DG Hip Unilat W OR W/O Pelvis 2-3 Views Right Hip exam today is unremarkable.  I suspect mild bursitis.  Given his history of prostate cancer I would like to get an x-ray of the hip to evaluate further.  Meanwhile I will treat the patient with Celebrex  200 mg every 12 hours as needed.  His blood pressure today is outstanding.  Regarding his diabetes I will check a hemoglobin A1c.  Ideally I like to see his A1c less than 6.5.  I like to see his LDL cholesterol less than 899.  I would like to see his microalbumin to creatinine ratio less than 30.  Diabetic foot exam was normal

## 2024-09-12 LAB — HEMOGLOBIN A1C
Hgb A1c MFr Bld: 6.5 % — ABNORMAL HIGH (ref <5.7)
Mean Plasma Glucose: 140 mg/dL
eAG (mmol/L): 7.7 mmol/L

## 2024-09-12 LAB — CBC WITH DIFFERENTIAL/PLATELET
Absolute Lymphocytes: 2089 {cells}/uL (ref 850–3900)
Absolute Monocytes: 366 {cells}/uL (ref 200–950)
Basophils Absolute: 53 {cells}/uL (ref 0–200)
Basophils Relative: 0.9 %
Eosinophils Absolute: 142 {cells}/uL (ref 15–500)
Eosinophils Relative: 2.4 %
HCT: 49.2 % (ref 38.5–50.0)
Hemoglobin: 16.2 g/dL (ref 13.2–17.1)
MCH: 27.6 pg (ref 27.0–33.0)
MCHC: 32.9 g/dL (ref 32.0–36.0)
MCV: 83.7 fL (ref 80.0–100.0)
MPV: 10.2 fL (ref 7.5–12.5)
Monocytes Relative: 6.2 %
Neutro Abs: 3251 {cells}/uL (ref 1500–7800)
Neutrophils Relative %: 55.1 %
Platelets: 244 Thousand/uL (ref 140–400)
RBC: 5.88 Million/uL — ABNORMAL HIGH (ref 4.20–5.80)
RDW: 13.3 % (ref 11.0–15.0)
Total Lymphocyte: 35.4 %
WBC: 5.9 Thousand/uL (ref 3.8–10.8)

## 2024-09-12 LAB — COMPREHENSIVE METABOLIC PANEL WITH GFR
AG Ratio: 1.9 (calc) (ref 1.0–2.5)
ALT: 22 U/L (ref 9–46)
AST: 17 U/L (ref 10–35)
Albumin: 4.4 g/dL (ref 3.6–5.1)
Alkaline phosphatase (APISO): 55 U/L (ref 35–144)
BUN: 11 mg/dL (ref 7–25)
CO2: 23 mmol/L (ref 20–32)
Calcium: 9.6 mg/dL (ref 8.6–10.3)
Chloride: 106 mmol/L (ref 98–110)
Creat: 1.27 mg/dL (ref 0.70–1.35)
Globulin: 2.3 g/dL (ref 1.9–3.7)
Glucose, Bld: 106 mg/dL — ABNORMAL HIGH (ref 65–99)
Potassium: 4.4 mmol/L (ref 3.5–5.3)
Sodium: 139 mmol/L (ref 135–146)
Total Bilirubin: 0.5 mg/dL (ref 0.2–1.2)
Total Protein: 6.7 g/dL (ref 6.1–8.1)
eGFR: 62 mL/min/1.73m2 (ref >=60)

## 2024-09-12 LAB — LIPID PANEL
Cholesterol: 163 mg/dL (ref <200)
HDL: 43 mg/dL (ref >=40)
LDL Cholesterol (Calc): 98 mg/dL
Non-HDL Cholesterol (Calc): 120 mg/dL (ref <130)
Total CHOL/HDL Ratio: 3.8 (calc) (ref <5.0)
Triglycerides: 120 mg/dL (ref <150)

## 2024-09-12 LAB — MICROALBUMIN / CREATININE URINE RATIO
Creatinine, Urine: 96 mg/dL (ref 20–320)
Microalb Creat Ratio: 3 mg/g{creat} (ref <30)
Microalb, Ur: 0.3 mg/dL

## 2024-09-13 ENCOUNTER — Other Ambulatory Visit: Payer: Self-pay | Admitting: Family Medicine

## 2024-09-14 ENCOUNTER — Ambulatory Visit (HOSPITAL_COMMUNITY)
Admission: RE | Admit: 2024-09-14 | Discharge: 2024-09-14 | Disposition: A | Discharge status: Home / Self Care | Source: Ambulatory Visit | Attending: Family Medicine | Admitting: Family Medicine

## 2024-09-14 ENCOUNTER — Ambulatory Visit: Payer: Self-pay | Admitting: Family Medicine

## 2024-09-14 DIAGNOSIS — M25551 Pain in right hip: Secondary | ICD-10-CM | POA: Diagnosis present

## 2024-09-14 NOTE — Telephone Encounter (Signed)
 Too soon for refill.  Requested Prescriptions  Pending Prescriptions Disp Refills   celecoxib  (CELEBREX ) 200 MG capsule [Pharmacy Med Name: CELECOXIB  200MG  CAPSULES] 180 capsule     Sig: TAKE 1 CAPSULE(200 MG) BY MOUTH TWICE DAILY     Analgesics:  COX2 Inhibitors Failed - 09/14/2024 10:32 AM      Failed - Manual Review: Labs are only required if the patient has taken medication for more than 8 weeks.      Passed - HGB in normal range and within 360 days    Hemoglobin  Date Value Ref Range Status  09/11/2024 16.2 13.2 - 17.1 g/dL Final         Passed - Cr in normal range and within 360 days    Creat  Date Value Ref Range Status  09/11/2024 1.27 0.70 - 1.35 mg/dL Final   Creatinine, Urine  Date Value Ref Range Status  09/11/2024 96 20 - 320 mg/dL Final         Passed - HCT in normal range and within 360 days    HCT  Date Value Ref Range Status  09/11/2024 49.2 38.5 - 50.0 % Final         Passed - AST in normal range and within 360 days    AST  Date Value Ref Range Status  09/11/2024 17 10 - 35 U/L Final         Passed - ALT in normal range and within 360 days    ALT  Date Value Ref Range Status  09/11/2024 22 9 - 46 U/L Final         Passed - eGFR is 30 or above and within 360 days    GFR, Est African American  Date Value Ref Range Status  11/29/2020 78 > OR = 60 mL/min/1.73m2 Final   GFR, Est Non African American  Date Value Ref Range Status  11/29/2020 67 > OR = 60 mL/min/1.51m2 Final   eGFR  Date Value Ref Range Status  09/11/2024 62 > OR = 60 mL/min/1.32m2 Final         Passed - Patient is not pregnant      Passed - Valid encounter within last 12 months    Recent Outpatient Visits           3 days ago Controlled type 2 diabetes mellitus with complication, without long-term current use of insulin (HCC)   [place] [facility] Koepke, Butler DASEN, MD   2 months ago Inguinal strain, right, initial encounter   [place] [facility]  Family Medicine Lucatero, Butler DASEN, MD   4 months ago Intertrigo   [place] [facility] Duanne Butler DASEN, MD   6 months ago Controlled type 2 diabetes mellitus with complication, without long-term current use of insulin [facility])   [place] [facility] Stiggers, Butler DASEN, MD   9 months ago General medical exam   [place] [facility] [place], Butler DASEN, MD

## 2024-10-13 ENCOUNTER — Other Ambulatory Visit: Payer: Self-pay | Admitting: Family Medicine

## 2024-10-14 ENCOUNTER — Other Ambulatory Visit: Payer: Self-pay | Admitting: Family Medicine

## 2024-10-27 ENCOUNTER — Ambulatory Visit: Admitting: Family Medicine

## 2024-11-10 ENCOUNTER — Other Ambulatory Visit: Payer: Self-pay

## 2024-11-10 ENCOUNTER — Telehealth: Payer: Self-pay | Admitting: Family Medicine

## 2024-11-10 DIAGNOSIS — E118 Type 2 diabetes mellitus with unspecified complications: Secondary | ICD-10-CM

## 2024-11-10 MED ORDER — EMPAGLIFLOZIN 25 MG PO TABS: 25.0000 mg | ORAL_TABLET | Freq: Every day | ORAL | 2 refills | Status: AC

## 2024-11-10 NOTE — Telephone Encounter (Signed)
 Prescription Request  11/10/2024  LOV: 09/11/2024  What is the name of the medication or equipment? JARDIANCE  25 MG TABS tablet   Have you contacted your pharmacy to request a refill? Yes   Which pharmacy would you like this sent to?  [facility] - [place], [place] - [address] [place] [address] Phone: [phone] Fax: [phone]    Patient notified that their request is being sent to the clinical staff for review and that they should receive a response within 2 business days.   Please advise at [facility] [phone]

## 2024-11-12 ENCOUNTER — Other Ambulatory Visit: Payer: Self-pay

## 2024-11-12 ENCOUNTER — Telehealth: Payer: Self-pay | Admitting: Family Medicine

## 2024-11-12 MED ORDER — ATORVASTATIN CALCIUM 20 MG PO TABS: 20.0000 mg | ORAL_TABLET | Freq: Every day | ORAL | 2 refills | Status: AC

## 2024-11-12 MED ORDER — LOSARTAN POTASSIUM 100 MG PO TABS: 100.0000 mg | ORAL_TABLET | Freq: Every day | ORAL | 1 refills | Status: AC

## 2024-11-12 MED ORDER — AMLODIPINE BESYLATE 10 MG PO TABS: 10.0000 mg | ORAL_TABLET | Freq: Every day | ORAL | 2 refills | Status: AC

## 2024-11-12 NOTE — Telephone Encounter (Signed)
 Prescription Request  11/12/2024  LOV: 09/11/2024  What is the name of the medication or equipment?   losartan  (COZAAR ) 100 MG tablet  atorvastatin  (LIPITOR) 20 MG tablet   amLODipine  (NORVASC ) 10 MG tablet  **90 day scripts requested**   Have you contacted your pharmacy to request a refill? Yes   Which pharmacy would you like this sent to?  [facility] - [place], [place] - [address] [place] [address] Phone: [phone] Fax: [phone]    Patient notified that their request is being sent to the clinical staff for review and that they should receive a response within 2 business days.   Please advise pharmacist.

## 2024-11-12 NOTE — Telephone Encounter (Signed)
 Sent in medication

## 2024-12-17 ENCOUNTER — Encounter: Admitting: Family Medicine
